# Patient Record
Sex: Male | Born: 1935
Health system: Southern US, Community
[De-identification: ages and names within clinical notes are randomized; demographics above are authoritative.]

## PROBLEM LIST (undated history)

## (undated) DIAGNOSIS — I10 Essential (primary) hypertension: Secondary | ICD-10-CM

## (undated) DIAGNOSIS — I639 Cerebral infarction, unspecified: Secondary | ICD-10-CM

## (undated) DIAGNOSIS — J449 Chronic obstructive pulmonary disease, unspecified: Secondary | ICD-10-CM

## (undated) HISTORY — DX: Cerebral infarction, unspecified: I63.9

## (undated) HISTORY — DX: Chronic obstructive pulmonary disease, unspecified: J44.9

## (undated) HISTORY — DX: Essential (primary) hypertension: I10

---

## 2012-12-05 ENCOUNTER — Ambulatory Visit: Payer: Self-pay | Admitting: Internal Medicine

## 2020-02-19 ENCOUNTER — Other Ambulatory Visit: Payer: Self-pay

## 2020-02-19 ENCOUNTER — Encounter: Payer: Self-pay | Admitting: Family Medicine

## 2020-02-19 ENCOUNTER — Ambulatory Visit (INDEPENDENT_AMBULATORY_CARE_PROVIDER_SITE_OTHER): Payer: Medicare Other | Admitting: Family Medicine

## 2020-02-19 VITALS — BP 132/75 | HR 88 | Temp 98.2°F | Ht 67.0 in | Wt 140.0 lb

## 2020-02-19 DIAGNOSIS — Z8673 Personal history of transient ischemic attack (TIA), and cerebral infarction without residual deficits: Secondary | ICD-10-CM

## 2020-02-19 DIAGNOSIS — I10 Essential (primary) hypertension: Secondary | ICD-10-CM | POA: Diagnosis not present

## 2020-02-19 DIAGNOSIS — J449 Chronic obstructive pulmonary disease, unspecified: Secondary | ICD-10-CM

## 2020-02-19 DIAGNOSIS — R634 Abnormal weight loss: Secondary | ICD-10-CM

## 2020-02-19 DIAGNOSIS — R5383 Other fatigue: Secondary | ICD-10-CM | POA: Diagnosis not present

## 2020-02-19 DIAGNOSIS — Z7689 Persons encountering health services in other specified circumstances: Secondary | ICD-10-CM | POA: Diagnosis not present

## 2020-02-19 MED ORDER — MEGESTROL ACETATE 20 MG PO TABS: 20.0000 mg | ORAL_TABLET | Freq: Two times a day (BID) | ORAL | 0 refills | Status: DC | PRN

## 2020-02-19 NOTE — Progress Notes (Signed)
BP 132/75   Pulse 88   Temp 98.2 F (36.8 C) (Oral)   Ht 5\' 7"  (1.702 m)   Wt 140 lb (63.5 kg)   SpO2 97%   BMI 21.93 kg/m    Subjective:    Patient ID: Ralph Leon, male    DOB: 03/30/1936, 84 y.o.   MRN: [id number]  HPI: Ralph Leon is a 84 y.o. male  Chief Complaint  Patient presents with  . Establish Care    pt wants to discuss about fatigue and shortness of breath    Pt presenting today to establish care.   Was told at [facility] that he'd had some small strokes when he presented for some dizziness in the past - states this happened at least 5 years ago but doesn't remember for sure. Has not been followed by Neurology since these episodes.   Currently taking lisinopril for HTN. Does not check home BPs. Tolerating well, denies CP, dizziness, HAs, syncope.   Last labs were about 5 years ago per patient.   Not having a good appetite and getting constipated the past few months. Last colonoscopy was many years ago. No blood in stools per patient.   Feeling tired the last 6 months or so, worse than his usual level of tiredness with no routine changes. Denies CP but does have SOB. Hx of COPD - never been on an inhaler in the past. No wheezing, frequent illnesses or coughing spells.   Relevant past medical, surgical, family and social history reviewed and updated as indicated. Interim medical history since our last visit reviewed. Allergies and medications reviewed and updated.  Review of Systems  Per HPI unless specifically indicated above     Objective:    BP 132/75   Pulse 88   Temp 98.2 F (36.8 C) (Oral)   Ht 5\' 7"  (1.702 m)   Wt 140 lb (63.5 kg)   SpO2 97%   BMI 21.93 kg/m   Wt Readings from Last 3 Encounters:  02/19/20 140 lb (63.5 kg)    Physical Exam Vitals and nursing note reviewed.  Constitutional:      Appearance: Normal appearance.  HENT:     Head: Atraumatic.  Eyes:     Extraocular Movements: Extraocular movements intact.   Conjunctiva/sclera: Conjunctivae normal.  Cardiovascular:     Rate and Rhythm: Normal rate and regular rhythm.  Pulmonary:     Effort: Pulmonary effort is normal.     Breath sounds: Wheezing (minimal wheezes diffusely) present.  Musculoskeletal:        General: Normal range of motion.     Cervical back: Normal range of motion and neck supple.  Skin:    General: Skin is warm and dry.  Neurological:     General: No focal deficit present.     Mental Status: He is oriented to person, place, and time.  Psychiatric:        Mood and Affect: Mood normal.        Thought Content: Thought content normal.        Judgment: Judgment normal.     Results for orders placed or performed in visit on 02/19/20  CBC with Differential/Platelet  Result Value Ref Range   WBC 7.7 3.4 - 10.8 x10E3/uL   RBC 4.79 4.14 - 5.80 x10E6/uL   Hemoglobin 14.5 13.0 - 17.7 g/dL   Hematocrit 43.8 37.5 - 51.0 %   MCV 91 79 - 97 fL   MCH 30.3 26.6 - 33.0 pg  MCHC 33.1 31.5 - 35.7 g/dL   RDW 25.0 53.9 - 76.7 %   Platelets 212 150 - 450 x10E3/uL   Neutrophils 64 Not Estab. %   Lymphs 22 Not Estab. %   Monocytes 8 Not Estab. %   Eos 5 Not Estab. %   Basos 1 Not Estab. %   Neutrophils Absolute 4.9 1.4 - 7.0 x10E3/uL   Lymphocytes Absolute 1.7 0.7 - 3.1 x10E3/uL   Monocytes Absolute 0.6 0.1 - 0.9 x10E3/uL   EOS (ABSOLUTE) 0.4 0.0 - 0.4 x10E3/uL   Basophils Absolute 0.0 0.0 - 0.2 x10E3/uL   Immature Granulocytes 0 Not Estab. %   Immature Grans (Abs) 0.0 0.0 - 0.1 x10E3/uL  Comprehensive metabolic panel  Result Value Ref Range   Glucose 110 (H) 65 - 99 mg/dL   BUN 18 8 - 27 mg/dL   Creatinine, Ser 3.41 0.76 - 1.27 mg/dL   GFR calc non Af Amer 62 >59 mL/min/1.73   GFR calc Af Amer 72 >59 mL/min/1.73   BUN/Creatinine Ratio 16 10 - 24   Sodium 142 134 - 144 mmol/L   Potassium 4.5 3.5 - 5.2 mmol/L   Chloride 102 96 - 106 mmol/L   CO2 26 20 - 29 mmol/L   Calcium 9.3 8.6 - 10.2 mg/dL   Total Protein 6.5 6.0 -  8.5 g/dL   Albumin 4.2 3.6 - 4.6 g/dL   Globulin, Total 2.3 1.5 - 4.5 g/dL   Albumin/Globulin Ratio 1.8 1.2 - 2.2   Bilirubin Total 0.3 0.0 - 1.2 mg/dL   Alkaline Phosphatase 85 39 - 117 IU/L   AST 15 0 - 40 IU/L   ALT 8 0 - 44 IU/L  Lipid Panel w/o Chol/HDL Ratio  Result Value Ref Range   Cholesterol, Total 231 (H) 100 - 199 mg/dL   Triglycerides 937 0 - 149 mg/dL   HDL 65 >90 mg/dL   VLDL Cholesterol Cal 24 5 - 40 mg/dL   LDL Chol Calc (NIH) 240 (H) 0 - 99 mg/dL  UA/M w/rflx Culture, Routine   Specimen: Urine   URINE  Result Value Ref Range   Specific Gravity, UA 1.025 1.005 - 1.030   pH, UA 6.0 5.0 - 7.5   Color, UA Yellow Yellow   Appearance Ur Clear Clear   Leukocytes,UA Negative Negative   Protein,UA Negative Negative/Trace   Glucose, UA Negative Negative   Ketones, UA Trace (A) Negative   RBC, UA Negative Negative   Bilirubin, UA Negative Negative   Urobilinogen, Ur 0.2 0.2 - 1.0 mg/dL   Nitrite, UA Negative Negative  TSH  Result Value Ref Range   TSH 1.680 0.450 - 4.500 uIU/mL      Assessment & Plan:   Problem List Items Addressed This Visit      Cardiovascular and Mediastinum   Essential hypertension    BPs stable and WNL, continue current regimen      Relevant Medications   lisinopril (ZESTRIL) 10 MG tablet   Other Relevant Orders   CBC with Differential/Platelet (Completed)   Comprehensive metabolic panel (Completed)   UA/M w/rflx Culture, Routine (Completed)     Respiratory   COPD (chronic obstructive pulmonary disease) (HCC)    Unclear if his SOB is due to COPD or cardiac/conditioning causes. Will obtain Spirometry, imaging at next visit and await basic labs today        Other   History of CVA (cerebrovascular accident)    Poor historian, will obtain records from previous providers  and obtain basic labs today.       Relevant Orders   Lipid Panel w/o Chol/HDL Ratio (Completed)    Other Visit Diagnoses    Weight loss    -  Primary   Poor  appetite, will trial megace and perform basic labs. Recheck in 2 weeks. May require further workup including GI, chest imaging, etc   Relevant Orders   TSH (Completed)   Encounter to establish care       Fatigue, unspecified type       Relevant Orders   CBC with Differential/Platelet (Completed)       Follow up plan: Return in about 2 weeks (around 03/04/2020) for SOB, Weight check.

## 2020-02-20 LAB — COMPREHENSIVE METABOLIC PANEL
ALT: 8 IU/L (ref 0–44)
AST: 15 IU/L (ref 0–40)
Albumin/Globulin Ratio: 1.8 (ref 1.2–2.2)
Albumin: 4.2 g/dL (ref 3.6–4.6)
Alkaline Phosphatase: 85 IU/L (ref 39–117)
BUN/Creatinine Ratio: 16 (ref 10–24)
BUN: 18 mg/dL (ref 8–27)
Bilirubin Total: 0.3 mg/dL (ref 0.0–1.2)
CO2: 26 mmol/L (ref 20–29)
Calcium: 9.3 mg/dL (ref 8.6–10.2)
Chloride: 102 mmol/L (ref 96–106)
Creatinine, Ser: 1.1 mg/dL (ref 0.76–1.27)
GFR calc Af Amer: 72 mL/min/{1.73_m2} (ref >59)
GFR calc non Af Amer: 62 mL/min/{1.73_m2} (ref >59)
Globulin, Total: 2.3 g/dL (ref 1.5–4.5)
Glucose: 110 mg/dL — ABNORMAL HIGH (ref 65–99)
Potassium: 4.5 mmol/L (ref 3.5–5.2)
Sodium: 142 mmol/L (ref 134–144)
Total Protein: 6.5 g/dL (ref 6.0–8.5)

## 2020-02-20 LAB — LIPID PANEL W/O CHOL/HDL RATIO
Cholesterol, Total: 231 mg/dL — ABNORMAL HIGH (ref 100–199)
HDL: 65 mg/dL (ref >39)
LDL Chol Calc (NIH): 142 mg/dL — ABNORMAL HIGH (ref 0–99)
Triglycerides: 138 mg/dL (ref 0–149)
VLDL Cholesterol Cal: 24 mg/dL (ref 5–40)

## 2020-02-20 LAB — UA/M W/RFLX CULTURE, ROUTINE
Bilirubin, UA: NEGATIVE
Glucose, UA: NEGATIVE
Leukocytes,UA: NEGATIVE
Nitrite, UA: NEGATIVE
Protein,UA: NEGATIVE
RBC, UA: NEGATIVE
Specific Gravity, UA: 1.025 (ref 1.005–1.030)
Urobilinogen, Ur: 0.2 mg/dL (ref 0.2–1.0)
pH, UA: 6 (ref 5.0–7.5)

## 2020-02-20 LAB — CBC WITH DIFFERENTIAL/PLATELET
Basophils Absolute: 0 10*3/uL (ref 0.0–0.2)
Basos: 1 %
EOS (ABSOLUTE): 0.4 10*3/uL (ref 0.0–0.4)
Eos: 5 %
Hematocrit: 43.8 % (ref 37.5–51.0)
Hemoglobin: 14.5 g/dL (ref 13.0–17.7)
Immature Grans (Abs): 0 10*3/uL (ref 0.0–0.1)
Immature Granulocytes: 0 %
Lymphocytes Absolute: 1.7 10*3/uL (ref 0.7–3.1)
Lymphs: 22 %
MCH: 30.3 pg (ref 26.6–33.0)
MCHC: 33.1 g/dL (ref 31.5–35.7)
MCV: 91 fL (ref 79–97)
Monocytes Absolute: 0.6 10*3/uL (ref 0.1–0.9)
Monocytes: 8 %
Neutrophils Absolute: 4.9 10*3/uL (ref 1.4–7.0)
Neutrophils: 64 %
Platelets: 212 10*3/uL (ref 150–450)
RBC: 4.79 x10E6/uL (ref 4.14–5.80)
RDW: 13.7 % (ref 11.6–15.4)
WBC: 7.7 10*3/uL (ref 3.4–10.8)

## 2020-02-20 LAB — TSH: TSH: 1.68 u[IU]/mL (ref 0.450–4.500)

## 2020-03-03 DIAGNOSIS — Z8673 Personal history of transient ischemic attack (TIA), and cerebral infarction without residual deficits: Secondary | ICD-10-CM | POA: Insufficient documentation

## 2020-03-03 DIAGNOSIS — I10 Essential (primary) hypertension: Secondary | ICD-10-CM | POA: Insufficient documentation

## 2020-03-03 DIAGNOSIS — J449 Chronic obstructive pulmonary disease, unspecified: Secondary | ICD-10-CM | POA: Insufficient documentation

## 2020-03-03 NOTE — Assessment & Plan Note (Signed)
Poor historian, will obtain records from previous providers and obtain basic labs today.

## 2020-03-03 NOTE — Assessment & Plan Note (Signed)
BPs stable and WNL, continue current regimen 

## 2020-03-03 NOTE — Assessment & Plan Note (Signed)
Unclear if his SOB is due to COPD or cardiac/conditioning causes. Will obtain Spirometry, imaging at next visit and await basic labs today

## 2020-03-04 ENCOUNTER — Ambulatory Visit (INDEPENDENT_AMBULATORY_CARE_PROVIDER_SITE_OTHER): Payer: Medicare Other | Admitting: Family Medicine

## 2020-03-04 ENCOUNTER — Other Ambulatory Visit: Payer: Self-pay

## 2020-03-04 ENCOUNTER — Encounter: Payer: Self-pay | Admitting: Family Medicine

## 2020-03-04 VITALS — BP 161/83 | HR 97 | Temp 98.2°F | Wt 136.0 lb

## 2020-03-04 DIAGNOSIS — R9431 Abnormal electrocardiogram [ECG] [EKG]: Secondary | ICD-10-CM

## 2020-03-04 DIAGNOSIS — J449 Chronic obstructive pulmonary disease, unspecified: Secondary | ICD-10-CM | POA: Diagnosis not present

## 2020-03-04 DIAGNOSIS — R0602 Shortness of breath: Secondary | ICD-10-CM | POA: Diagnosis not present

## 2020-03-04 DIAGNOSIS — R634 Abnormal weight loss: Secondary | ICD-10-CM | POA: Diagnosis not present

## 2020-03-04 DIAGNOSIS — I10 Essential (primary) hypertension: Secondary | ICD-10-CM

## 2020-03-04 MED ORDER — LISINOPRIL 20 MG PO TABS: 20.0000 mg | ORAL_TABLET | Freq: Every day | ORAL | 1 refills | Status: DC

## 2020-03-04 MED ORDER — MEGESTROL ACETATE 20 MG PO TABS: 20.0000 mg | ORAL_TABLET | Freq: Two times a day (BID) | ORAL | 1 refills | Status: DC | PRN

## 2020-03-04 MED ORDER — ANORO ELLIPTA 62.5-25 MCG/INH IN AEPB: 1.0000 | INHALATION_SPRAY | Freq: Every day | RESPIRATORY_TRACT | 0 refills | Status: DC

## 2020-03-04 NOTE — Progress Notes (Signed)
BP (!) 161/83   Pulse 97   Temp 98.2 F (36.8 C) (Oral)   Wt 136 lb (61.7 kg)   SpO2 96%   BMI 21.30 kg/m    Subjective:    Patient ID: Ralph Leon, male    DOB: May 01, 1936, 84 y.o.   MRN: [id number]  HPI: Ralph Leon is a 84 y.o. male  Chief Complaint  Patient presents with  . Shortness of Breath  . Weight Check   Presenting today for weight follow up after starting megace for appetite stimulation and his ongoing SOB issues.   COPD - started at age 25, smokes 1 ppd but lately cut back to half ppd. DOE with mowing the yard, doing a lot of walking. Denies CP, wheezing, syncope, diaphoresis, nausea associated. States he's never been on any inhaler regimen for his COPD. Has not had any imaging in years per patient.   Not feeling as constipated now that he's eating more. Denies N/V, abdominal pain. Feels the megace is helping and he's been eating substantially more, though his weight is down 4 lb from last visit.   Home BP readings running about 140/80 on lisinopril 10 mg per patient. Denies side effects.   Relevant past medical, surgical, family and social history reviewed and updated as indicated. Interim medical history since our last visit reviewed. Allergies and medications reviewed and updated.  Review of Systems  Per HPI unless specifically indicated above     Objective:    BP (!) 161/83   Pulse 97   Temp 98.2 F (36.8 C) (Oral)   Wt 136 lb (61.7 kg)   SpO2 96%   BMI 21.30 kg/m   Wt Readings from Last 3 Encounters:  03/04/20 136 lb (61.7 kg)  02/19/20 140 lb (63.5 kg)    Physical Exam Vitals and nursing note reviewed.  Constitutional:      General: He is not in acute distress.    Comments: underweight  HENT:     Head: Atraumatic.  Eyes:     Extraocular Movements: Extraocular movements intact.     Conjunctiva/sclera: Conjunctivae normal.  Cardiovascular:     Rate and Rhythm: Normal rate and regular rhythm.  Pulmonary:     Effort: Pulmonary  effort is normal.     Comments: Breath sounds decreased diffusely Abdominal:     General: Bowel sounds are normal. There is no distension.     Palpations: Abdomen is soft.     Tenderness: There is no abdominal tenderness.  Musculoskeletal:        General: Normal range of motion.     Cervical back: Normal range of motion and neck supple.  Skin:    General: Skin is warm and dry.  Neurological:     General: No focal deficit present.     Mental Status: He is alert and oriented to person, place, and time.  Psychiatric:        Mood and Affect: Mood normal.        Thought Content: Thought content normal.        Judgment: Judgment normal.     Results for orders placed or performed in visit on 02/19/20  CBC with Differential/Platelet  Result Value Ref Range   WBC 7.7 3.4 - 10.8 x10E3/uL   RBC 4.79 4.14 - 5.80 x10E6/uL   Hemoglobin 14.5 13.0 - 17.7 g/dL   Hematocrit 25.0 53.9 - 51.0 %   MCV 91 79 - 97 fL   MCH 30.3 26.6 - 33.0 pg  MCHC 33.1 31.5 - 35.7 g/dL   RDW 52.8 41.3 - 24.4 %   Platelets 212 150 - 450 x10E3/uL   Neutrophils 64 Not Estab. %   Lymphs 22 Not Estab. %   Monocytes 8 Not Estab. %   Eos 5 Not Estab. %   Basos 1 Not Estab. %   Neutrophils Absolute 4.9 1.4 - 7.0 x10E3/uL   Lymphocytes Absolute 1.7 0.7 - 3.1 x10E3/uL   Monocytes Absolute 0.6 0.1 - 0.9 x10E3/uL   EOS (ABSOLUTE) 0.4 0.0 - 0.4 x10E3/uL   Basophils Absolute 0.0 0.0 - 0.2 x10E3/uL   Immature Granulocytes 0 Not Estab. %   Immature Grans (Abs) 0.0 0.0 - 0.1 x10E3/uL  Comprehensive metabolic panel  Result Value Ref Range   Glucose 110 (H) 65 - 99 mg/dL   BUN 18 8 - 27 mg/dL   Creatinine, Ser 0.10 0.76 - 1.27 mg/dL   GFR calc non Af Amer 62 >59 mL/min/1.73   GFR calc Af Amer 72 >59 mL/min/1.73   BUN/Creatinine Ratio 16 10 - 24   Sodium 142 134 - 144 mmol/L   Potassium 4.5 3.5 - 5.2 mmol/L   Chloride 102 96 - 106 mmol/L   CO2 26 20 - 29 mmol/L   Calcium 9.3 8.6 - 10.2 mg/dL   Total Protein 6.5 6.0  - 8.5 g/dL   Albumin 4.2 3.6 - 4.6 g/dL   Globulin, Total 2.3 1.5 - 4.5 g/dL   Albumin/Globulin Ratio 1.8 1.2 - 2.2   Bilirubin Total 0.3 0.0 - 1.2 mg/dL   Alkaline Phosphatase 85 39 - 117 IU/L   AST 15 0 - 40 IU/L   ALT 8 0 - 44 IU/L  Lipid Panel w/o Chol/HDL Ratio  Result Value Ref Range   Cholesterol, Total 231 (H) 100 - 199 mg/dL   Triglycerides 272 0 - 149 mg/dL   HDL 65 >53 mg/dL   VLDL Cholesterol Cal 24 5 - 40 mg/dL   LDL Chol Calc (NIH) 664 (H) 0 - 99 mg/dL  UA/M w/rflx Culture, Routine   Specimen: Urine   URINE  Result Value Ref Range   Specific Gravity, UA 1.025 1.005 - 1.030   pH, UA 6.0 5.0 - 7.5   Color, UA Yellow Yellow   Appearance Ur Clear Clear   Leukocytes,UA Negative Negative   Protein,UA Negative Negative/Trace   Glucose, UA Negative Negative   Ketones, UA Trace (A) Negative   RBC, UA Negative Negative   Bilirubin, UA Negative Negative   Urobilinogen, Ur 0.2 0.2 - 1.0 mg/dL   Nitrite, UA Negative Negative  TSH  Result Value Ref Range   TSH 1.680 0.450 - 4.500 uIU/mL      Assessment & Plan:   Problem List Items Addressed This Visit      Cardiovascular and Mediastinum   Essential hypertension    BPs mildly above goal consistently, increase to 20 mg lisinopril and recheck at upcoming f/u. Continue home monitoring      Relevant Medications   lisinopril (ZESTRIL) 20 MG tablet     Respiratory   COPD (chronic obstructive pulmonary disease) (HCC)    Will start anoro and obtain CT Chest due to his progressive and worrisome sxs. Continue working on smoking cessation      Relevant Medications   umeclidinium-vilanterol (ANORO ELLIPTA) 62.5-25 MCG/INH AEPB    Other Visit Diagnoses    SOB (shortness of breath)    -  Primary   Concern for malignancy given worsening DOE, unexplained  weight loss, fatigue and 66 pack year smoking hx. Obtain CT chest, start anoro, ekg   Relevant Orders   EKG 12-Lead (Completed)   Shortness of breath       Relevant  Orders   CT Chest W Contrast   Ambulatory referral to Cardiology   Unexplained weight loss       Continue megace, push PO, monitor closely   Relevant Orders   CT Chest W Contrast   Abnormal EKG       EKG showing possible old infarct/ischemic changes. Will refer to Cardiology for further eval given his worsening DOE though possibly more related to his COPD   Relevant Orders   Ambulatory referral to Cardiology       Follow up plan: Return in about 4 weeks (around 04/01/2020) for SOB, BP f/u.

## 2020-03-05 NOTE — Assessment & Plan Note (Signed)
Will start anoro and obtain CT Chest due to his progressive and worrisome sxs. Continue working on smoking cessation

## 2020-03-05 NOTE — Assessment & Plan Note (Signed)
BPs mildly above goal consistently, increase to 20 mg lisinopril and recheck at upcoming f/u. Continue home monitoring

## 2020-03-08 ENCOUNTER — Ambulatory Visit
Admission: RE | Admit: 2020-03-08 | Discharge: 2020-03-08 | Disposition: A | Discharge status: Home / Self Care | Payer: Medicare Other | Source: Ambulatory Visit | Attending: Family Medicine | Admitting: Family Medicine

## 2020-03-08 ENCOUNTER — Other Ambulatory Visit: Payer: Self-pay

## 2020-03-08 DIAGNOSIS — R634 Abnormal weight loss: Secondary | ICD-10-CM | POA: Diagnosis present

## 2020-03-08 DIAGNOSIS — R0602 Shortness of breath: Secondary | ICD-10-CM

## 2020-03-08 MED ORDER — IOHEXOL 300 MG/ML  SOLN
75.0000 mL | Freq: Once | INTRAMUSCULAR | Status: AC | PRN
  Administered 2020-03-08: 11:00:00 75 mL via INTRAVENOUS

## 2020-03-11 ENCOUNTER — Other Ambulatory Visit: Payer: Self-pay

## 2020-03-11 ENCOUNTER — Encounter: Payer: Self-pay | Admitting: Cardiology

## 2020-03-11 ENCOUNTER — Ambulatory Visit (INDEPENDENT_AMBULATORY_CARE_PROVIDER_SITE_OTHER): Payer: Medicare Other | Admitting: Cardiology

## 2020-03-11 VITALS — BP 150/82 | HR 87 | Temp 98.0°F | Ht 67.0 in | Wt 138.4 lb

## 2020-03-11 DIAGNOSIS — R0600 Dyspnea, unspecified: Secondary | ICD-10-CM

## 2020-03-11 DIAGNOSIS — F17200 Nicotine dependence, unspecified, uncomplicated: Secondary | ICD-10-CM | POA: Diagnosis not present

## 2020-03-11 DIAGNOSIS — R0609 Other forms of dyspnea: Secondary | ICD-10-CM

## 2020-03-11 NOTE — Progress Notes (Signed)
Cardiology Office Note:    Date:  03/11/2020   ID:  Ralph Leon, DOB 08-05-1936, MRN [id number]  PCP:  Particia Nearing, PA-C  Cardiologist:  No primary care provider on file.  Electrophysiologist:  None   Referring MD: Particia Nearing,*   Chief Complaint  Patient presents with  . New Patient (Initial Visit)    Ref by Roosvelt Maser, PA-C for abnormal EKG and shortness of breath.  Pt. c/o shortness of breath with walking a far distance.     History of Present Illness:    Ralph Leon is a 84 y.o. male with a hx of hypertension, stroke, COPD, current smoker x50+ years who presents due to shortness of breath and abnormal EKG. EKG obtained on 03/04/2020 by primary care provider showed old septal infarct.  He denies chest pain.  He states having worsening shortness of breath over the past 3 months.  Walking or trying to mow his lawn causes him to have shortness of breath, resting improves his symptoms.  He denies palpitations or any history of heart disease.  He otherwise feels okay.  Denies any history of heart attacks.  He is a current smoker.  Past Medical History:  Diagnosis Date  . COPD (chronic obstructive pulmonary disease) (HCC)   . Hypertension   . Stroke [facility])     History reviewed. No pertinent surgical history.  Current Medications: Current Meds  Medication Sig  . lisinopril (ZESTRIL) 20 MG tablet Take 1 tablet (20 mg total) by mouth daily.  . megestrol (MEGACE) 20 MG tablet Take 1 tablet (20 mg total) by mouth 2 (two) times daily as needed.  . umeclidinium-vilanterol (ANORO ELLIPTA) 62.5-25 MCG/INH AEPB Inhale 1 puff into the lungs daily.     Allergies:   Patient has no known allergies.   Social History   Socioeconomic History  . Marital status: Single    Spouse name: Not on file  . Number of children: Not on file  . Years of education: Not on file  . Highest education level: Not on file  Occupational History  . Not on file  Tobacco Use  .  Smoking status: Current Every Day Smoker    Packs/day: 0.50    Types: Cigarettes  . Smokeless tobacco: Never Used  Substance and Sexual Activity  . Alcohol use: Yes    Alcohol/week: 5.0 standard drinks    Types: 5 Cans of beer per week  . Drug use: Not Currently  . Sexual activity: Not Currently  Other Topics Concern  . Not on file  Social History Narrative  . Not on file   Social Determinants of Health   Financial Resource Strain:   . Difficulty of Paying Living Expenses:   Food Insecurity:   . Worried About Programme researcher, broadcasting/film/video in the Last Year:   . Barista in the Last Year:   Transportation Needs:   . Freight forwarder (Medical):   Marland Kitchen Lack of Transportation (Non-Medical):   Physical Activity:   . Days of Exercise per Week:   . Minutes of Exercise per Session:   Stress:   . Feeling of Stress :   Social Connections:   . Frequency of Communication with Friends and Family:   . Frequency of Social Gatherings with Friends and Family:   . Attends Religious Services:   . Active Member of Clubs or Organizations:   . Attends Banker Meetings:   Marland Kitchen Marital Status:      Family  History: The patient's family history includes Diabetes in his mother; Heart attack in his father.  ROS:   Please see the history of present illness.     All other systems reviewed and are negative.  EKGs/Labs/Other Studies Reviewed:    The following studies were reviewed today:   EKG:  EKG is  ordered today.  The ekg ordered today demonstrates sinus rhythm, possible old septal infarct.  Recent Labs: 02/19/2020: ALT 8; BUN 18; Creatinine, Ser 1.10; Hemoglobin 14.5; Platelets 212; Potassium 4.5; Sodium 142; TSH 1.680  Recent Lipid Panel    Component Value Date/Time   CHOL 231 (H) 02/19/2020 1608   TRIG 138 02/19/2020 1608   HDL 65 02/19/2020 1608   LDLCALC 142 (H) 02/19/2020 1608    Physical Exam:    VS:  BP (!) 150/82 (BP Location: Right Arm, Patient Position:  Sitting, Cuff Size: Normal)   Pulse 87   Temp 98 F (36.7 C)   Ht 5\' 7"  (1.702 m)   Wt 138 lb 6 oz (62.8 kg)   BMI 21.67 kg/m     Wt Readings from Last 3 Encounters:  03/11/20 138 lb 6 oz (62.8 kg)  03/04/20 136 lb (61.7 kg)  02/19/20 140 lb (63.5 kg)     GEN:  Well nourished, well developed in no acute distress HEENT: Normal NECK: No JVD; No carotid bruits LYMPHATICS: No lymphadenopathy CARDIAC: RRR, no murmurs, rubs, gallops RESPIRATORY: Decreased breath sounds but otherwise clear ABDOMEN: Soft, non-tender, non-distended MUSCULOSKELETAL:  No edema; No deformity  SKIN: Warm and dry NEUROLOGIC:  Alert and oriented x 3 PSYCHIATRIC:  Normal affect   ASSESSMENT:    1. DOE (dyspnea on exertion)   2. Smoking    PLAN:    In order of problems listed above:  1. Patient with a 38-month history of worsening dyspnea on exertion.  He has risk factors of hypertension, current smoker.  This could be angina.  Also COPD could be contributing.  Will evaluate for presence of ischemia with Myoview.  Get echocardiogram to evaluate any structural abnormalities. 2. Patient is a current smoker.  Smoking cessation advised.  Follow-up after echo and stress test.  This note was generated in part or whole with voice recognition software. Voice recognition is usually quite accurate but there are transcription errors that can and very often do occur. I apologize for any typographical errors that were not detected and corrected.  Medication Adjustments/Labs and Tests Ordered: Current medicines are reviewed at length with the patient today.  Concerns regarding medicines are outlined above.  Orders Placed This Encounter  Procedures  . NM Myocar Multi W/Spect W/Wall Motion / EF  . EKG 12-Lead  . ECHOCARDIOGRAM COMPLETE   No orders of the defined types were placed in this encounter.   Patient Instructions  Medication Instructions:  Your physician recommends that you continue on your current  medications as directed. Please refer to the Current Medication list given to you today.  *If you need a refill on your cardiac medications before your next appointment, please call your pharmacy*   Lab Work: none If you have labs (blood work) drawn today and your tests are completely normal, you will receive your results only by: 2-month MyChart Message (if you have MyChart) OR . A paper copy in the mail If you have any lab test that is abnormal or we need to change your treatment, we will call you to review the results.   Testing/Procedures: 1- ECHOCARDIOGRAM- Your physician has requested  that you have an echocardiogram. Echocardiography is a painless test that uses sound waves to create images of your heart. It provides your doctor with information about the size and shape of your heart and how well your heart's chambers and valves are working. This procedure takes approximately one hour. There are no restrictions for this procedure. You may get an IV, if needed, to receive an ultrasound enhancing agent through to better visualize your heart.    2- LEXISCAN MYOVIEW Your physician has requested that you have a lexiscan myoview. For further information please visit [email or web address] Please follow instruction sheet, as given.  [facility] MYOVIEW  Your caregiver has ordered a Stress Test with nuclear imaging. The purpose of this test is to evaluate the blood supply to your heart muscle. This procedure is referred to as a "Non-Invasive Stress Test." This is because other than having an IV started in your vein, nothing is inserted or "invades" your body. Cardiac stress tests are done to find areas of poor blood flow to the heart by determining the extent of coronary artery disease (CAD). Some patients exercise on a treadmill, which naturally increases the blood flow to your heart, while others who are  unable to walk on a treadmill due to physical limitations have a pharmacologic/chemical stress agent  called Lexiscan . This medicine will mimic walking on a treadmill by temporarily increasing your coronary blood flow.   Please note: these test may take anywhere between 2-4 hours to complete  PLEASE REPORT TO [facility] MEDICAL MALL ENTRANCE  THE VOLUNTEERS AT THE FIRST DESK WILL DIRECT YOU WHERE TO GO  Date of Procedure:_____________________________________  Arrival Time for Procedure:______________________________   PLEASE NOTIFY THE OFFICE AT LEAST 24 HOURS IN ADVANCE IF YOU ARE UNABLE TO KEEP YOUR APPOINTMENT.  [phone] AND  PLEASE NOTIFY NUCLEAR MEDICINE AT [facility] AT LEAST 24 HOURS IN ADVANCE IF YOU ARE UNABLE TO KEEP YOUR APPOINTMENT. [phone]  How to prepare for your Myoview test:  1. Do not eat or drink after midnight 2. No caffeine for 24 hours prior to test 3. No smoking 24 hours prior to test. 4. Your medication may be taken with water.  If your doctor stopped a medication because of this test, do not take that medication. 5. Ladies, please do not wear dresses.  Skirts or pants are appropriate. Please wear a short sleeve shirt. 6. No perfume, cologne or lotion. 7. Wear comfortable walking shoes.   Follow-Up: At [facility], you and your health needs are our priority.  As part of our continuing mission to provide you with exceptional heart care, we have created designated Provider Care Teams.  These Care Teams include your primary Cardiologist (physician) and Advanced Practice Providers (APPs -  Physician Assistants and Nurse Practitioners) who all work together to provide you with the care you need, when you need it.  We recommend signing up for the patient portal called "MyChart".  Sign up information is provided on this After Visit Summary.  MyChart is used to connect with patients for Virtual Visits (Telemedicine).  Patients are able to view lab/test results, encounter notes, upcoming appointments, etc.  Non-urgent messages can be sent to your provider as well.   To  learn more about what you can do with MyChart, go to [email or web address].    Your next appointment:   After testing.  The format for your next appointment:   In Person  Provider:   Debbe Odea, MD    Echocardiogram An echocardiogram is a procedure  that uses painless sound waves (ultrasound) to produce an image of the heart. Images from an echocardiogram can provide important information about:  Signs of coronary artery disease (CAD).  Aneurysm detection. An aneurysm is a weak or damaged part of an artery wall that bulges out from the normal force of blood pumping through the body.  Heart size and shape. Changes in the size or shape of the heart can be associated with certain conditions, including heart failure, aneurysm, and CAD.  Heart muscle function.  Heart valve function.  Signs of a past heart attack.  Fluid buildup around the heart.  Thickening of the heart muscle.  A tumor or infectious growth around the heart valves. Tell a health care provider about:  Any allergies you have.  All medicines you are taking, including vitamins, herbs, eye drops, creams, and over-the-counter medicines.  Any blood disorders you have.  Any surgeries you have had.  Any medical conditions you have.  Whether you are pregnant or may be pregnant. What are the risks? Generally, this is a safe procedure. However, problems may occur, including:  Allergic reaction to dye (contrast) that may be used during the procedure. What happens before the procedure? No specific preparation is needed. You may eat and drink normally. What happens during the procedure?   An IV tube may be inserted into one of your veins.  You may receive contrast through this tube. A contrast is an injection that improves the quality of the pictures from your heart.  A gel will be applied to your chest.  A wand-like tool (transducer) will be moved over your chest. The gel will help to transmit the  sound waves from the transducer.  The sound waves will harmlessly bounce off of your heart to allow the heart images to be captured in real-time motion. The images will be recorded on a computer. The procedure may vary among health care providers and hospitals. What happens after the procedure?  You may return to your normal, everyday life, including diet, activities, and medicines, unless your health care provider tells you not to do that. Summary  An echocardiogram is a procedure that uses painless sound waves (ultrasound) to produce an image of the heart.  Images from an echocardiogram can provide important information about the size and shape of your heart, heart muscle function, heart valve function, and fluid buildup around your heart.  You do not need to do anything to prepare before this procedure. You may eat and drink normally.  After the echocardiogram is completed, you may return to your normal, everyday life, unless your health care provider tells you not to do that. This information is not intended to replace advice given to you by your health care provider. Make sure you discuss any questions you have with your health care provider. Document Revised: 03/02/2019 Document Reviewed: 12/12/2016 Elsevier Patient Education  2020 [place].    Cardiac Nuclear Scan A cardiac nuclear scan is a test that measures blood flow to the heart when a person is resting and when he or she is exercising. The test looks for problems such as:  Not enough blood reaching a portion of the heart.  The heart muscle not working normally. You may need this test if:  You have heart disease.  You have had abnormal lab results.  You have had heart surgery or a balloon procedure to open up blocked arteries (angioplasty).  You have chest pain.  You have shortness of breath. In this test,  a radioactive dye (tracer) is injected into your bloodstream. After the tracer has traveled to your heart,  an imaging device is used to measure how much of the tracer is absorbed by or distributed to various areas of your heart. This procedure is usually done at a hospital and takes 2-4 hours. Tell a health care provider about:  Any allergies you have.  All medicines you are taking, including vitamins, herbs, eye drops, creams, and over-the-counter medicines.  Any problems you or family members have had with anesthetic medicines.  Any blood disorders you have.  Any surgeries you have had.  Any medical conditions you have.  Whether you are pregnant or may be pregnant. What are the risks? Generally, this is a safe procedure. However, problems may occur, including:  Serious chest pain and heart attack. This is only a risk if the stress portion of the test is done.  Rapid heartbeat.  Sensation of warmth in your chest. This usually passes quickly.  Allergic reaction to the tracer. What happens before the procedure?  Ask your health care provider about changing or stopping your regular medicines. This is especially important if you are taking diabetes medicines or blood thinners.  Follow instructions from your health care provider about eating or drinking restrictions.  Remove your jewelry on the day of the procedure. What happens during the procedure?  An IV will be inserted into one of your veins.  Your health care provider will inject a small amount of radioactive tracer through the IV.  You will wait for 20-40 minutes while the tracer travels through your bloodstream.  Your heart activity will be monitored with an electrocardiogram (ECG).  You will lie down on an exam table.  Images of your heart will be taken for about 15-20 minutes.  You may also have a stress test. For this test, one of the following may be done: ? You will exercise on a treadmill or stationary bike. While you exercise, your heart's activity will be monitored with an ECG, and your blood pressure will be  checked. ? You will be given medicines that will increase blood flow to parts of your heart. This is done if you are unable to exercise.  When blood flow to your heart has peaked, a tracer will again be injected through the IV.  After 20-40 minutes, you will get back on the exam table and have more images taken of your heart.  Depending on the type of tracer used, scans may need to be repeated 3-4 hours later.  Your IV line will be removed when the procedure is over. The procedure may vary among health care providers and hospitals. What happens after the procedure?  Unless your health care provider tells you otherwise, you may return to your normal schedule, including diet, activities, and medicines.  Unless your health care provider tells you otherwise, you may increase your fluid intake. This will help to flush the contrast dye from your body. Drink enough fluid to keep your urine pale yellow.  Ask your health care provider, or the department that is doing the test: ? When will my results be ready? ? How will I get my results? Summary  A cardiac nuclear scan measures the blood flow to the heart when a person is resting and when he or she is exercising.  Tell your health care provider if you are pregnant.  Before the procedure, ask your health care provider about changing or stopping your regular medicines. This is especially important  if you are taking diabetes medicines or blood thinners.  After the procedure, unless your health care provider tells you otherwise, increase your fluid intake. This will help flush the contrast dye from your body.  After the procedure, unless your health care provider tells you otherwise, you may return to your normal schedule, including diet, activities, and medicines. This information is not intended to replace advice given to you by your health care provider. Make sure you discuss any questions you have with your health care provider. Document  Revised: 04/25/2018 Document Reviewed: 04/25/2018 Elsevier Patient Education  2020 [employer]     Signed, Debbe OdeaBrian Agbor-Etang, MD  03/11/2020 5:09 PM    [facility]

## 2020-03-11 NOTE — Patient Instructions (Addendum)
Medication Instructions:  Your physician recommends that you continue on your current medications as directed. Please refer to the Current Medication list given to you today.  *If you need a refill on your cardiac medications before your next appointment, please call your pharmacy*   Lab Work: none If you have labs (blood work) drawn today and your tests are completely normal, you will receive your results only by: Marland Kitchen MyChart Message (if you have MyChart) OR . A paper copy in the mail If you have any lab test that is abnormal or we need to change your treatment, we will call you to review the results.   Testing/Procedures: 1- ECHOCARDIOGRAM- Your physician has requested that you have an echocardiogram. Echocardiography is a painless test that uses sound waves to create images of your heart. It provides your doctor with information about the size and shape of your heart and how well your heart's chambers and valves are working. This procedure takes approximately one hour. There are no restrictions for this procedure. You may get an IV, if needed, to receive an ultrasound enhancing agent through to better visualize your heart.    2- LEXISCAN MYOVIEW Your physician has requested that you have a lexiscan myoview. For further information please visit [email or web address] Please follow instruction sheet, as given.  [facility] MYOVIEW  Your caregiver has ordered a Stress Test with nuclear imaging. The purpose of this test is to evaluate the blood supply to your heart muscle. This procedure is referred to as a "Non-Invasive Stress Test." This is because other than having an IV started in your vein, nothing is inserted or "invades" your body. Cardiac stress tests are done to find areas of poor blood flow to the heart by determining the extent of coronary artery disease (CAD). Some patients exercise on a treadmill, which naturally increases the blood flow to your heart, while others who are  unable to walk on  a treadmill due to physical limitations have a pharmacologic/chemical stress agent called Lexiscan . This medicine will mimic walking on a treadmill by temporarily increasing your coronary blood flow.   Please note: these test may take anywhere between 2-4 hours to complete  PLEASE REPORT TO [facility]  THE VOLUNTEERS AT THE FIRST DESK WILL DIRECT YOU WHERE TO GO  Date of Procedure:_____________________________________  Arrival Time for Procedure:______________________________   PLEASE NOTIFY THE OFFICE AT LEAST 24 HOURS IN ADVANCE IF YOU ARE UNABLE TO KEEP YOUR APPOINTMENT.  [phone] AND  PLEASE NOTIFY NUCLEAR MEDICINE AT [facility] AT LEAST 24 HOURS IN ADVANCE IF YOU ARE UNABLE TO KEEP YOUR APPOINTMENT. [phone]  How to prepare for your Myoview test:  1. Do not eat or drink after midnight 2. No caffeine for 24 hours prior to test 3. No smoking 24 hours prior to test. 4. Your medication may be taken with water.  If your doctor stopped a medication because of this test, do not take that medication. 5. Ladies, please do not wear dresses.  Skirts or pants are appropriate. Please wear a short sleeve shirt. 6. No perfume, cologne or lotion. 7. Wear comfortable walking shoes.   Follow-Up: At [facility], you and your health needs are our priority.  As part of our continuing mission to provide you with exceptional heart care, we have created designated Provider Care Teams.  These Care Teams include your primary Cardiologist (physician) and Advanced Practice Providers (APPs -  Physician Assistants and Nurse Practitioners) who all work together to provide you with the  care you need, when you need it.  We recommend signing up for the patient portal called "MyChart".  Sign up information is provided on this After Visit Summary.  MyChart is used to connect with patients for Virtual Visits (Telemedicine).  Patients are able to view lab/test results, encounter notes, upcoming  appointments, etc.  Non-urgent messages can be sent to your provider as well.   To learn more about what you can do with MyChart, go to [email or web address].    Your next appointment:   After testing.  The format for your next appointment:   In Person  Provider:   Kate Sable, MD    Echocardiogram An echocardiogram is a procedure that uses painless sound waves (ultrasound) to produce an image of the heart. Images from an echocardiogram can provide important information about:  Signs of coronary artery disease (CAD).  Aneurysm detection. An aneurysm is a weak or damaged part of an artery wall that bulges out from the normal force of blood pumping through the body.  Heart size and shape. Changes in the size or shape of the heart can be associated with certain conditions, including heart failure, aneurysm, and CAD.  Heart muscle function.  Heart valve function.  Signs of a past heart attack.  Fluid buildup around the heart.  Thickening of the heart muscle.  A tumor or infectious growth around the heart valves. Tell a health care provider about:  Any allergies you have.  All medicines you are taking, including vitamins, herbs, eye drops, creams, and over-the-counter medicines.  Any blood disorders you have.  Any surgeries you have had.  Any medical conditions you have.  Whether you are pregnant or may be pregnant. What are the risks? Generally, this is a safe procedure. However, problems may occur, including:  Allergic reaction to dye (contrast) that may be used during the procedure. What happens before the procedure? No specific preparation is needed. You may eat and drink normally. What happens during the procedure?   An IV tube may be inserted into one of your veins.  You may receive contrast through this tube. A contrast is an injection that improves the quality of the pictures from your heart.  A gel will be applied to your chest.  A wand-like  tool (transducer) will be moved over your chest. The gel will help to transmit the sound waves from the transducer.  The sound waves will harmlessly bounce off of your heart to allow the heart images to be captured in real-time motion. The images will be recorded on a computer. The procedure may vary among health care providers and hospitals. What happens after the procedure?  You may return to your normal, everyday life, including diet, activities, and medicines, unless your health care provider tells you not to do that. Summary  An echocardiogram is a procedure that uses painless sound waves (ultrasound) to produce an image of the heart.  Images from an echocardiogram can provide important information about the size and shape of your heart, heart muscle function, heart valve function, and fluid buildup around your heart.  You do not need to do anything to prepare before this procedure. You may eat and drink normally.  After the echocardiogram is completed, you may return to your normal, everyday life, unless your health care provider tells you not to do that. This information is not intended to replace advice given to you by your health care provider. Make sure you discuss any questions you have with your health care  provider. Document Revised: 03/02/2019 Document Reviewed: 12/12/2016 Elsevier Patient Education  2020 [employer]    Cardiac Nuclear Scan A cardiac nuclear scan is a test that measures blood flow to the heart when a person is resting and when he or she is exercising. The test looks for problems such as:  Not enough blood reaching a portion of the heart.  The heart muscle not working normally. You may need this test if:  You have heart disease.  You have had abnormal lab results.  You have had heart surgery or a balloon procedure to open up blocked arteries (angioplasty).  You have chest pain.  You have shortness of breath. In this test, a radioactive dye  (tracer) is injected into your bloodstream. After the tracer has traveled to your heart, an imaging device is used to measure how much of the tracer is absorbed by or distributed to various areas of your heart. This procedure is usually done at a hospital and takes 2-4 hours. Tell a health care provider about:  Any allergies you have.  All medicines you are taking, including vitamins, herbs, eye drops, creams, and over-the-counter medicines.  Any problems you or family members have had with anesthetic medicines.  Any blood disorders you have.  Any surgeries you have had.  Any medical conditions you have.  Whether you are pregnant or may be pregnant. What are the risks? Generally, this is a safe procedure. However, problems may occur, including:  Serious chest pain and heart attack. This is only a risk if the stress portion of the test is done.  Rapid heartbeat.  Sensation of warmth in your chest. This usually passes quickly.  Allergic reaction to the tracer. What happens before the procedure?  Ask your health care provider about changing or stopping your regular medicines. This is especially important if you are taking diabetes medicines or blood thinners.  Follow instructions from your health care provider about eating or drinking restrictions.  Remove your jewelry on the day of the procedure. What happens during the procedure?  An IV will be inserted into one of your veins.  Your health care provider will inject a small amount of radioactive tracer through the IV.  You will wait for 20-40 minutes while the tracer travels through your bloodstream.  Your heart activity will be monitored with an electrocardiogram (ECG).  You will lie down on an exam table.  Images of your heart will be taken for about 15-20 minutes.  You may also have a stress test. For this test, one of the following may be done: ? You will exercise on a treadmill or stationary bike. While you  exercise, your heart's activity will be monitored with an ECG, and your blood pressure will be checked. ? You will be given medicines that will increase blood flow to parts of your heart. This is done if you are unable to exercise.  When blood flow to your heart has peaked, a tracer will again be injected through the IV.  After 20-40 minutes, you will get back on the exam table and have more images taken of your heart.  Depending on the type of tracer used, scans may need to be repeated 3-4 hours later.  Your IV line will be removed when the procedure is over. The procedure may vary among health care providers and hospitals. What happens after the procedure?  Unless your health care provider tells you otherwise, you may return to your normal schedule, including diet, activities, and medicines.  Unless your health care provider tells you otherwise, you may increase your fluid intake. This will help to flush the contrast dye from your body. Drink enough fluid to keep your urine pale yellow.  Ask your health care provider, or the department that is doing the test: ? When will my results be ready? ? How will I get my results? Summary  A cardiac nuclear scan measures the blood flow to the heart when a person is resting and when he or she is exercising.  Tell your health care provider if you are pregnant.  Before the procedure, ask your health care provider about changing or stopping your regular medicines. This is especially important if you are taking diabetes medicines or blood thinners.  After the procedure, unless your health care provider tells you otherwise, increase your fluid intake. This will help flush the contrast dye from your body.  After the procedure, unless your health care provider tells you otherwise, you may return to your normal schedule, including diet, activities, and medicines. This information is not intended to replace advice given to you by your health care  provider. Make sure you discuss any questions you have with your health care provider. Document Revised: 04/25/2018 Document Reviewed: 04/25/2018 Elsevier Patient Education  2020 [employer].

## 2020-03-13 ENCOUNTER — Ambulatory Visit: Payer: Self-pay

## 2020-03-22 ENCOUNTER — Other Ambulatory Visit: Payer: Self-pay

## 2020-03-22 ENCOUNTER — Encounter
Admission: RE | Admit: 2020-03-22 | Discharge: 2020-03-22 | Disposition: A | Discharge status: Home / Self Care | Payer: Medicare Other | Source: Ambulatory Visit | Attending: Cardiology | Admitting: Cardiology

## 2020-03-22 DIAGNOSIS — R0600 Dyspnea, unspecified: Secondary | ICD-10-CM | POA: Insufficient documentation

## 2020-03-22 DIAGNOSIS — R0609 Other forms of dyspnea: Secondary | ICD-10-CM

## 2020-03-22 LAB — NM MYOCAR MULTI W/SPECT W/WALL MOTION / EF
LV dias vol: 43 mL (ref 62–150)
LV sys vol: 12 mL
Peak HR: 110 {beats}/min
Percent HR: 79 %
Rest HR: 86 {beats}/min
SDS: 2
SRS: 0
SSS: 0
TID: 0.81

## 2020-03-22 MED ORDER — TECHNETIUM TC 99M TETROFOSMIN IV KIT
31.1200 | PACK | Freq: Once | INTRAVENOUS | Status: AC | PRN
  Administered 2020-03-22: 31.12 via INTRAVENOUS

## 2020-03-22 MED ORDER — REGADENOSON 0.4 MG/5ML IV SOLN
0.4000 mg | Freq: Once | INTRAVENOUS | Status: AC
  Administered 2020-03-22: 0.4 mg via INTRAVENOUS

## 2020-03-22 MED ORDER — TECHNETIUM TC 99M TETROFOSMIN IV KIT
10.6600 | PACK | Freq: Once | INTRAVENOUS | Status: AC | PRN
  Administered 2020-03-22: 10.66 via INTRAVENOUS

## 2020-04-01 ENCOUNTER — Other Ambulatory Visit: Payer: Self-pay

## 2020-04-01 ENCOUNTER — Ambulatory Visit (INDEPENDENT_AMBULATORY_CARE_PROVIDER_SITE_OTHER): Payer: Medicare Other | Admitting: Family Medicine

## 2020-04-01 ENCOUNTER — Encounter: Payer: Self-pay | Admitting: Family Medicine

## 2020-04-01 VITALS — BP 128/75 | HR 89 | Temp 98.1°F | Ht 64.13 in | Wt 138.1 lb

## 2020-04-01 DIAGNOSIS — I10 Essential (primary) hypertension: Secondary | ICD-10-CM

## 2020-04-01 DIAGNOSIS — J449 Chronic obstructive pulmonary disease, unspecified: Secondary | ICD-10-CM | POA: Diagnosis not present

## 2020-04-01 DIAGNOSIS — R634 Abnormal weight loss: Secondary | ICD-10-CM | POA: Diagnosis not present

## 2020-04-01 MED ORDER — MEGESTROL ACETATE 20 MG PO TABS: 20.0000 mg | ORAL_TABLET | Freq: Two times a day (BID) | ORAL | 2 refills | Status: DC | PRN

## 2020-04-01 MED ORDER — TRIAMCINOLONE ACETONIDE 0.1 % EX CREA
1.0000 | TOPICAL_CREAM | Freq: Two times a day (BID) | CUTANEOUS | 1 refills | Status: DC
Start: 2020-04-01 — End: 2021-01-20

## 2020-04-01 NOTE — Progress Notes (Signed)
BP 128/75   Pulse 89   Temp 98.1 F (36.7 C)   Ht 5' 4.13" (1.629 m)   Wt 138 lb 2 oz (62.7 kg)   SpO2 98%   BMI 23.61 kg/m    Subjective:    Patient ID: Ralph Leon, male    DOB: [birth year], 84 y.o.   MRN: [id number]  HPI: Ralph Leon is a 84 y.o. male  Chief Complaint  Patient presents with  . Shortness of Breath  . Hypertension   Here today for follow up several concerns.  HTN - tolerating medication well, not checking home BPs. Denies CP, dizziness, syncope. Tries to eat well, active as tolerated. Working with Cardiology currently for eval of his DOE. Awaiting a stress test and echocardiogram. EKG so far unrevealing.   SOB sxs about the same per patient, not much improvement since addition of Anoro. Denies wheezing, diaphoresis. States sxs mostly only happen when mowing the yard or other physical activities. Recent CT chest negative for nodules or other abnormalities.   Megace seems to be helping with appetite. Has been eating 1-2 additional meals daily. Denies further weight loss.   Relevant past medical, surgical, family and social history reviewed and updated as indicated. Interim medical history since our last visit reviewed. Allergies and medications reviewed and updated.  Review of Systems  Per HPI unless specifically indicated above     Objective:    BP 128/75   Pulse 89   Temp 98.1 F (36.7 C)   Ht 5' 4.13" (1.629 m)   Wt 138 lb 2 oz (62.7 kg)   SpO2 98%   BMI 23.61 kg/m   Wt Readings from Last 3 Encounters:  04/01/20 138 lb 2 oz (62.7 kg)  03/11/20 138 lb 6 oz (62.8 kg)  03/04/20 136 lb (61.7 kg)    Physical Exam Vitals and nursing note reviewed.  Constitutional:      Appearance: Normal appearance.  HENT:     Head: Atraumatic.  Eyes:     Extraocular Movements: Extraocular movements intact.     Conjunctiva/sclera: Conjunctivae normal.  Cardiovascular:     Rate and Rhythm: Normal rate and regular rhythm.  Pulmonary:     Effort:  Pulmonary effort is normal.     Breath sounds: Normal breath sounds.  Musculoskeletal:        General: Normal range of motion.     Cervical back: Normal range of motion and neck supple.  Skin:    General: Skin is warm and dry.  Neurological:     General: No focal deficit present.     Mental Status: He is oriented to person, place, and time.  Psychiatric:        Mood and Affect: Mood normal.        Thought Content: Thought content normal.        Judgment: Judgment normal.     Results for orders placed or performed during the hospital encounter of 03/22/20  NM Myocar Multi W/Spect W/Wall Motion / EF  Result Value Ref Range   Rest HR 86 bpm   Rest BP 152/72 mmHg   Percent HR 79 %   Peak HR 110 bpm   Peak BP 148/70 mmHg   SSS 0    SRS 0    SDS 2    TID 0.81    LV sys vol 12 mL   LV dias vol 43 62 - 150 mL      Assessment & Plan:   Problem  List Items Addressed This Visit      Cardiovascular and Mediastinum   Essential hypertension - Primary    BPs stable and WNL, continue current regimen        Respiratory   COPD (chronic obstructive pulmonary disease) (HCC)    Not much benefit on anoro, but will continue while working up for other causes of SOB       Other Visit Diagnoses    Weight loss       Weight stable since starting megace, continue working on increasing dietary intake       Follow up plan: Return in about 3 months (around 07/02/2020) for SOB, Weight check.

## 2020-04-07 NOTE — Assessment & Plan Note (Signed)
BPs stable and WNL, continue current regimen 

## 2020-04-07 NOTE — Assessment & Plan Note (Signed)
Not much benefit on anoro, but will continue while working up for other causes of SOB

## 2020-04-23 ENCOUNTER — Ambulatory Visit (INDEPENDENT_AMBULATORY_CARE_PROVIDER_SITE_OTHER): Payer: Medicare Other

## 2020-04-23 ENCOUNTER — Other Ambulatory Visit: Payer: Self-pay

## 2020-04-23 DIAGNOSIS — R0609 Other forms of dyspnea: Secondary | ICD-10-CM

## 2020-04-23 DIAGNOSIS — R0600 Dyspnea, unspecified: Secondary | ICD-10-CM

## 2020-04-26 ENCOUNTER — Ambulatory Visit (INDEPENDENT_AMBULATORY_CARE_PROVIDER_SITE_OTHER): Payer: Medicare Other | Admitting: Cardiology

## 2020-04-26 ENCOUNTER — Other Ambulatory Visit: Payer: Self-pay

## 2020-04-26 ENCOUNTER — Encounter: Payer: Self-pay | Admitting: Cardiology

## 2020-04-26 VITALS — BP 124/70 | HR 100 | Ht 67.0 in | Wt 140.0 lb

## 2020-04-26 DIAGNOSIS — R0600 Dyspnea, unspecified: Secondary | ICD-10-CM | POA: Diagnosis not present

## 2020-04-26 DIAGNOSIS — F17200 Nicotine dependence, unspecified, uncomplicated: Secondary | ICD-10-CM

## 2020-04-26 DIAGNOSIS — I340 Nonrheumatic mitral (valve) insufficiency: Secondary | ICD-10-CM | POA: Diagnosis not present

## 2020-04-26 DIAGNOSIS — I10 Essential (primary) hypertension: Secondary | ICD-10-CM

## 2020-04-26 DIAGNOSIS — R0609 Other forms of dyspnea: Secondary | ICD-10-CM

## 2020-04-26 NOTE — Progress Notes (Signed)
Cardiology Office Note:    Date:  04/26/2020   ID:  Ralph Leon, DOB [birth year], MRN [id number]  PCP:  Particia Nearing, PA-C  Cardiologist:  Debbe Odea, MD  Electrophysiologist:  None   Referring MD: Particia Nearing,*   Chief Complaint  Patient presents with  . other    Follow up Lexiscan & Echo. Meds reviewed by the pt. verbally. Pt. c/o shortness of breath on exertion.     History of Present Illness:    Ralph Leon is a 84 y.o. male with a hx of hypertension, stroke, COPD, current smoker x50+ years who presents for follow-up.  He was last seen due to shortness of breath for 3 months.  Patient denies chest pain.  Due to risk factors of smoking, hypertension, echocardiogram and stress test was ordered to evaluate for ischemia and cardiac function.  Patient has no new concerns at this time.  Still smokes.  Presents for test results.   Past Medical History:  Diagnosis Date  . COPD (chronic obstructive pulmonary disease) (HCC)   . Hypertension   . Stroke [facility])     History reviewed. No pertinent surgical history.  Current Medications: Current Meds  Medication Sig  . lisinopril (ZESTRIL) 20 MG tablet Take 1 tablet (20 mg total) by mouth daily.  . megestrol (MEGACE) 20 MG tablet Take 1 tablet (20 mg total) by mouth 2 (two) times daily as needed.  . triamcinolone cream (KENALOG) 0.1 % Apply 1 application topically 2 (two) times daily.  Marland Kitchen umeclidinium-vilanterol (ANORO ELLIPTA) 62.5-25 MCG/INH AEPB Inhale 1 puff into the lungs daily.     Allergies:   Patient has no known allergies.   Social History   Socioeconomic History  . Marital status: Single    Spouse name: Not on file  . Number of children: Not on file  . Years of education: Not on file  . Highest education level: Not on file  Occupational History  . Not on file  Tobacco Use  . Smoking status: Current Every Day Smoker    Packs/day: 0.50    Types: Cigarettes  . Smokeless tobacco: Never  Used  Substance and Sexual Activity  . Alcohol use: Yes    Alcohol/week: 5.0 standard drinks    Types: 5 Cans of beer per week  . Drug use: Not Currently  . Sexual activity: Not Currently  Other Topics Concern  . Not on file  Social History Narrative  . Not on file   Social Determinants of Health   Financial Resource Strain:   . Difficulty of Paying Living Expenses:   Food Insecurity:   . Worried About Programme researcher, broadcasting/film/video in the Last Year:   . Barista in the Last Year:   Transportation Needs:   . Freight forwarder (Medical):   Marland Kitchen Lack of Transportation (Non-Medical):   Physical Activity:   . Days of Exercise per Week:   . Minutes of Exercise per Session:   Stress:   . Feeling of Stress :   Social Connections:   . Frequency of Communication with Friends and Family:   . Frequency of Social Gatherings with Friends and Family:   . Attends Religious Services:   . Active Member of Clubs or Organizations:   . Attends Banker Meetings:   Marland Kitchen Marital Status:      Family History: The patient's family history includes Diabetes in his mother; Heart attack in his father.  ROS:   Please see the  history of present illness.     All other systems reviewed and are negative.  EKGs/Labs/Other Studies Reviewed:    The following studies were reviewed today:   EKG:  EKG is  ordered today.  The ekg ordered today demonstrates normal sinus rhythm, normal ECG.  Recent Labs: 02/19/2020: ALT 8; BUN 18; Creatinine, Ser 1.10; Hemoglobin 14.5; Platelets 212; Potassium 4.5; Sodium 142; TSH 1.680  Recent Lipid Panel    Component Value Date/Time   CHOL 231 (H) 02/19/2020 1608   TRIG 138 02/19/2020 1608   HDL 65 02/19/2020 1608   LDLCALC 142 (H) 02/19/2020 1608    Physical Exam:    VS:  BP 124/70 (BP Location: Left Arm, Patient Position: Sitting, Cuff Size: Normal)   Pulse 100   Ht 5\' 7"  (1.702 m)   Wt 140 lb (63.5 kg)   SpO2 96%   BMI 21.93 kg/m     Wt  Readings from Last 3 Encounters:  04/26/20 140 lb (63.5 kg)  04/01/20 138 lb 2 oz (62.7 kg)  03/11/20 138 lb 6 oz (62.8 kg)     GEN:  Well nourished, well developed in no acute distress HEENT: Normal NECK: No JVD; No carotid bruits LYMPHATICS: No lymphadenopathy CARDIAC: RRR, no murmurs, rubs, gallops RESPIRATORY: Decreased breath sounds but otherwise clear ABDOMEN: Soft, non-tender, non-distended MUSCULOSKELETAL:  No edema; No deformity  SKIN: Warm and dry NEUROLOGIC:  Alert and oriented x 3 PSYCHIATRIC:  Normal affect   ASSESSMENT:    1. DOE (dyspnea on exertion)   2. Mitral valve insufficiency, unspecified etiology   3. Essential hypertension   4. Smoking    PLAN:    In order of problems listed above:  1. Patient withworsening dyspnea on exertion.  He has risk factors of hypertension, current smoker.  Echocardiogram shows normal systolic and diastolic function, EF 55 to 60%.  Mild to moderate mitral regurgitation.  Lexiscan Myoview with no evidence for ischemia.  Low risk scan.  Patient made aware of test results.  Symptoms of shortness of breath likely from current smoking and COPD.  Will refer patient to pulmonary medicine for optimal/additional management of COPD if any. 2. Echocardiogram showed mild to moderate mitral regurgitation.  Plan for repeat echo in about 12 to 18 months. 3. Patient with a history of hypertension.  Blood pressure well controlled.  Continue lisinopril as prescribed. 4. Patient is a current smoker.  Smoking cessation advised.  Follow-up after echo in 1 year`  This note was generated in part or whole with voice recognition software. Voice recognition is usually quite accurate but there are transcription errors that can and very often do occur. I apologize for any typographical errors that were not detected and corrected.  Medication Adjustments/Labs and Tests Ordered: Current medicines are reviewed at length with the patient today.  Concerns  regarding medicines are outlined above.  Orders Placed This Encounter  Procedures  . Ambulatory referral to Pulmonology  . EKG 12-Lead  . ECHOCARDIOGRAM COMPLETE   No orders of the defined types were placed in this encounter.   Patient Instructions  Medication Instructions:  Your physician recommends that you continue on your current medications as directed. Please refer to the Current Medication list given to you today.  *If you need a refill on your cardiac medications before your next appointment, please call your pharmacy*   Lab Work: None Ordered If you have labs (blood work) drawn today and your tests are completely normal, you will receive your results only  by: . MyChart Message (if you have MyChart) OR . A paper copy in the mail If you have any lab test that is abnormal or we need to change your treatment, we will call you to review the results.   Testing/Procedures:  Your physician has requested that you have an echocardiogram. Echocardiography is a painless test that uses sound waves to create images of your heart. It provides your doctor with information about the size and shape of your heart and how well your heart's chambers and valves are working. This procedure takes approximately one hour. There are no restrictions for this procedure.  This will be scheduled just prior to your 1 year follow up.   Follow-Up:  At [facility], you and your health needs are our priority.  As part of our continuing mission to provide you with exceptional heart care, we have created designated Provider Care Teams.  These Care Teams include your primary Cardiologist (physician) and Advanced Practice Providers (APPs -  Physician Assistants and Nurse Practitioners) who all work together to provide you with the care you need, when you need it.  We recommend signing up for the patient portal called "MyChart".  Sign up information is provided on this After Visit Summary.  MyChart is used to  connect with patients for Virtual Visits (Telemedicine).  Patients are able to view lab/test results, encounter notes, upcoming appointments, etc.  Non-urgent messages can be sent to your provider as well.   To learn more about what you can do with MyChart, go to [email or web address].    Your next appointment:   1 year,  after echo to be scheduled just prior to.  The format for your next appointment:   In Person  Provider:   Debbe Odea, MD   Other Instructions   Echocardiogram An echocardiogram is a procedure that uses painless sound waves (ultrasound) to produce an image of the heart. Images from an echocardiogram can provide important information about:  Signs of coronary artery disease (CAD).  Aneurysm detection. An aneurysm is a weak or damaged part of an artery wall that bulges out from the normal force of blood pumping through the body.  Heart size and shape. Changes in the size or shape of the heart can be associated with certain conditions, including heart failure, aneurysm, and CAD.  Heart muscle function.  Heart valve function.  Signs of a past heart attack.  Fluid buildup around the heart.  Thickening of the heart muscle.  A tumor or infectious growth around the heart valves. Tell a health care provider about:  Any allergies you have.  All medicines you are taking, including vitamins, herbs, eye drops, creams, and over-the-counter medicines.  Any blood disorders you have.  Any surgeries you have had.  Any medical conditions you have.  Whether you are pregnant or may be pregnant. What are the risks? Generally, this is a safe procedure. However, problems may occur, including:  Allergic reaction to dye (contrast) that may be used during the procedure. What happens before the procedure? No specific preparation is needed. You may eat and drink normally. What happens during the procedure?   An IV tube may be inserted into one of your  veins.  You may receive contrast through this tube. A contrast is an injection that improves the quality of the pictures from your heart.  A gel will be applied to your chest.  A wand-like tool (transducer) will be moved over your chest. The gel will help to transmit the  sound waves from the transducer.  The sound waves will harmlessly bounce off of your heart to allow the heart images to be captured in real-time motion. The images will be recorded on a computer. The procedure may vary among health care providers and hospitals. What happens after the procedure?  You may return to your normal, everyday life, including diet, activities, and medicines, unless your health care provider tells you not to do that. Summary  An echocardiogram is a procedure that uses painless sound waves (ultrasound) to produce an image of the heart.  Images from an echocardiogram can provide important information about the size and shape of your heart, heart muscle function, heart valve function, and fluid buildup around your heart.  You do not need to do anything to prepare before this procedure. You may eat and drink normally.  After the echocardiogram is completed, you may return to your normal, everyday life, unless your health care provider tells you not to do that. This information is not intended to replace advice given to you by your health care provider. Make sure you discuss any questions you have with your health care provider. Document Revised: 03/02/2019 Document Reviewed: 12/12/2016 Elsevier Patient Education  2020 [place], Kate Sable, MD  04/26/2020 3:22 PM    [facility]

## 2020-04-26 NOTE — Patient Instructions (Signed)
Medication Instructions:  Your physician recommends that you continue on your current medications as directed. Please refer to the Current Medication list given to you today.  *If you need a refill on your cardiac medications before your next appointment, please call your pharmacy*   Lab Work: None Ordered If you have labs (blood work) drawn today and your tests are completely normal, you will receive your results only by: Marland Kitchen MyChart Message (if you have MyChart) OR . A paper copy in the mail If you have any lab test that is abnormal or we need to change your treatment, we will call you to review the results.   Testing/Procedures:  Your physician has requested that you have an echocardiogram. Echocardiography is a painless test that uses sound waves to create images of your heart. It provides your doctor with information about the size and shape of your heart and how well your heart's chambers and valves are working. This procedure takes approximately one hour. There are no restrictions for this procedure.  This will be scheduled just prior to your 1 year follow up.   Follow-Up:  At [facility], you and your health needs are our priority.  As part of our continuing mission to provide you with exceptional heart care, we have created designated Provider Care Teams.  These Care Teams include your primary Cardiologist (physician) and Advanced Practice Providers (APPs -  Physician Assistants and Nurse Practitioners) who all work together to provide you with the care you need, when you need it.  We recommend signing up for the patient portal called "MyChart".  Sign up information is provided on this After Visit Summary.  MyChart is used to connect with patients for Virtual Visits (Telemedicine).  Patients are able to view lab/test results, encounter notes, upcoming appointments, etc.  Non-urgent messages can be sent to your provider as well.   To learn more about what you can do with MyChart,  go to [email or web address].    Your next appointment:   1 year,  after echo to be scheduled just prior to.  The format for your next appointment:   In Person  Provider:   Debbe Odea, MD   Other Instructions   Echocardiogram An echocardiogram is a procedure that uses painless sound waves (ultrasound) to produce an image of the heart. Images from an echocardiogram can provide important information about:  Signs of coronary artery disease (CAD).  Aneurysm detection. An aneurysm is a weak or damaged part of an artery wall that bulges out from the normal force of blood pumping through the body.  Heart size and shape. Changes in the size or shape of the heart can be associated with certain conditions, including heart failure, aneurysm, and CAD.  Heart muscle function.  Heart valve function.  Signs of a past heart attack.  Fluid buildup around the heart.  Thickening of the heart muscle.  A tumor or infectious growth around the heart valves. Tell a health care provider about:  Any allergies you have.  All medicines you are taking, including vitamins, herbs, eye drops, creams, and over-the-counter medicines.  Any blood disorders you have.  Any surgeries you have had.  Any medical conditions you have.  Whether you are pregnant or may be pregnant. What are the risks? Generally, this is a safe procedure. However, problems may occur, including:  Allergic reaction to dye (contrast) that may be used during the procedure. What happens before the procedure? No specific preparation is needed. You may eat and drink  normally. What happens during the procedure?   An IV tube may be inserted into one of your veins.  You may receive contrast through this tube. A contrast is an injection that improves the quality of the pictures from your heart.  A gel will be applied to your chest.  A wand-like tool (transducer) will be moved over your chest. The gel will help to  transmit the sound waves from the transducer.  The sound waves will harmlessly bounce off of your heart to allow the heart images to be captured in real-time motion. The images will be recorded on a computer. The procedure may vary among health care providers and hospitals. What happens after the procedure?  You may return to your normal, everyday life, including diet, activities, and medicines, unless your health care provider tells you not to do that. Summary  An echocardiogram is a procedure that uses painless sound waves (ultrasound) to produce an image of the heart.  Images from an echocardiogram can provide important information about the size and shape of your heart, heart muscle function, heart valve function, and fluid buildup around your heart.  You do not need to do anything to prepare before this procedure. You may eat and drink normally.  After the echocardiogram is completed, you may return to your normal, everyday life, unless your health care provider tells you not to do that. This information is not intended to replace advice given to you by your health care provider. Make sure you discuss any questions you have with your health care provider. Document Revised: 03/02/2019 Document Reviewed: 12/12/2016 Elsevier Patient Education  [place].

## 2020-05-29 ENCOUNTER — Ambulatory Visit (INDEPENDENT_AMBULATORY_CARE_PROVIDER_SITE_OTHER): Payer: Medicare Other | Admitting: Pulmonary Disease

## 2020-05-29 ENCOUNTER — Encounter: Payer: Self-pay | Admitting: Pulmonary Disease

## 2020-05-29 ENCOUNTER — Other Ambulatory Visit: Payer: Self-pay

## 2020-05-29 VITALS — BP 128/72 | HR 92 | Temp 97.8°F | Ht 67.0 in | Wt 140.6 lb

## 2020-05-29 DIAGNOSIS — R12 Heartburn: Secondary | ICD-10-CM | POA: Diagnosis not present

## 2020-05-29 DIAGNOSIS — R0602 Shortness of breath: Secondary | ICD-10-CM

## 2020-05-29 DIAGNOSIS — F17210 Nicotine dependence, cigarettes, uncomplicated: Secondary | ICD-10-CM

## 2020-05-29 DIAGNOSIS — J449 Chronic obstructive pulmonary disease, unspecified: Secondary | ICD-10-CM | POA: Diagnosis not present

## 2020-05-29 MED ORDER — BREZTRI AEROSPHERE 160-9-4.8 MCG/ACT IN AERO: 2.0000 | INHALATION_SPRAY | Freq: Two times a day (BID) | RESPIRATORY_TRACT | 0 refills | Status: DC

## 2020-05-29 NOTE — Patient Instructions (Signed)
What we discussed today:  You have pulmonary emphysema  We are giving you a trial of different inhaler this call Breztri 2 puffs twice a day.  Rinse your mouth well after using please let us know how this inhaler does for you so we can call a prescription to your pharmacy  We are scheduling breathing tests  We will see you in follow-up in 2 months time call sooner should any new problems arise

## 2020-05-29 NOTE — Progress Notes (Signed)
Subjective:    Patient ID: Ralph Leon, male    DOB: 1936/02/04, 84 y.o.   MRN: [id number]  HPI Is an 84 year old current smoker (half pack of cigarettes per day) who presents for evaluation of shortness of breath.  He states that he has had shortness of breath for approximately 1 year but worse over the last 6 months.  He notes decreased vim, vigor and vitality.  He states that he sometimes coughs in the morning usually productive of whitish sputum.  No hemoptysis.  He has not had any chest pain.  He states that mowing his lawn does not cause him a problem however lifting his arms does.  Lifting objects also makes him short of breath.  Rest helps him.  He has not tried any inhalers with the exception of Anoro which he tried for approximately 1 month and he did not feel it helped any.  Did not renew this.  He has not had any fevers, chills or sweats.  He has not had orthopnea or paroxysmal nocturnal dyspnea.  No lower extremity edema.  Does have frequent heartburn.  A CT  scan of the chest was obtained in April 2021 showing coronary artery calcifications and severe bullous emphysema.  Patient also had a dilated esophagus.  Echocardiogram done on 25 April 2020 showed moderate mitral regurgitation.  A Lexiscan Myoview was interpreted as low risk for ischemia.  ECG performed on 26 April 2020 was normal, prior EKG showed possible old septal infarct.   Review of Systems A 10 point review of systems was performed and it is as noted above otherwise negative.  Past Medical History:  Diagnosis Date   COPD (chronic obstructive pulmonary disease) (HCC)    Hypertension    Stroke (HCC)    No past surgical history on file.  Family History  Problem Relation Age of Onset   Diabetes Mother    Heart attack Father    Social History   Tobacco Use   Smoking status: Current Every Day Smoker    Packs/day: 1.00    Years: 64.00    Pack years: 64.00    Types: Cigarettes   Smokeless tobacco: Never  Used   Tobacco comment: 0.5 pack daily-05/29/2020  Substance Use Topics   Alcohol use: Yes    Alcohol/week: 5.0 standard drinks    Types: 5 Cans of beer per week  Has been smoking since age 89.  Smoked 1 pack/day until recently when he has cut back to half a pack per day.  He drinks beer only on occasion.  He has 6 cats at home.  No unusual pets, no birds.  He worked in the [employer] for 2 years in 1956 1957.  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  He has not had any foreign travel.  Tested for tuberculosis previously, negative.  No Known Allergies   Current Meds  Medication Sig   lisinopril (ZESTRIL) 20 MG tablet Take 1 tablet (20 mg total) by mouth daily.   megestrol (MEGACE) 20 MG tablet Take 1 tablet (20 mg total) by mouth 2 (two) times daily as needed.   triamcinolone cream (KENALOG) 0.1 % Apply 1 application topically 2 (two) times daily.      Objective:   Physical Exam BP 128/72 (BP Location: Left Arm, Cuff Size: Normal)    Pulse 92    Temp 97.8 F (36.6 C) (Temporal)    Ht 5'  7" (1.702 m)    Wt 140 lb 9.6 oz (63.8 kg)    SpO2 96%    BMI 22.02 kg/m   GENERAL: Thin elderly male, no acute distress.  Fully ambulatory HEAD: Normocephalic, atraumatic.  EYES: Pupils equal, round, reactive to light.  No scleral icterus.  MOUTH: Nose/mouth/throat not examined due to masking requirements for COVID 19. NECK: Supple. No thyromegaly. Trachea midline. No JVD.  No adenopathy. PULMONARY: Distant breath sounds, coarse, no other adventitious sounds. CARDIOVASCULAR: S1 and S2. Regular rate and rhythm.  Grade 2/6 systolic ejection murmur left sternal border with radiation to axilla. GASTROINTESTINAL: Nondistended. MUSCULOSKELETAL: No joint deformity, no clubbing, no edema.  NEUROLOGIC: Very hard of hearing, awake, alert, no focal deficit noted.  No gait disturbance noted.  Speech is fluent. SKIN: Intact,warm,dry.  On limited exam  no rashes.   PSYCH: Mood and behavior appropriate.  Representative slice of chest CT performed in April shows very severe emphysema dilated esophagus and apical scarring.         Assessment & Plan:     ICD-10-CM   1. SOB (shortness of breath)  R06.02 Pulmonary Function Test [facility] Only   Patient has significant emphysema on CT scan of the chest Will obtain PFTs for evaluation of this issue   2. COPD with chronic bronchitis and emphysema (HCC)  J44.9    COPD with chronic bronchitis and emphysema clinical impression Emphysema noted on CT, quite advanced PFTs Trial of Breztri 2 inhalations twice a day   3. Heartburn  R12    Recommend antireflux measures Consider PPI, defer to primary care provider  4. Tobacco dependence due to cigarettes  F17.210    Patient was counseled regards to discontinuation of smoking Total counseling time 3 to 5 minutes   Discussion:  Patient has noted gradual onset of dyspnea for a year escalating over the last 6 months.  He did not seem to have any significant relief with Anoro Ellipta.  He needs evaluation of lung function as his FEV1 is unknown.  Will obtain PFTs to evaluate this issue further.  We will give him a trial of Breztri 2 inhalations twice a day to see if this helps his symptoms.  He is to let us know if this works and we will send a prescription to his pharmacy.  Of note he is having frequent heartburn.  We discussed antireflux measures.  He may need a trial of PPI but will defer this to his primary care provider.  This will need to be taken into consideration as the patient is on Megace which can cause significant dyspepsia.  We will see him in follow-up in 2 weeks time he is to contact us prior to that time should any new difficulties arise.   Gailen Shelter, MD [facility]   *This note was dictated using voice recognition software/Dragon.  Despite best efforts to proofread, errors can occur which can change the meaning.  Any change was  purely unintentional.

## 2020-06-07 ENCOUNTER — Telehealth: Payer: Self-pay

## 2020-06-07 NOTE — Telephone Encounter (Signed)
Pt is aware of date/time of covid test and voiced his understanding.  °Nothing further is needed.  °

## 2020-06-07 NOTE — Telephone Encounter (Signed)
Lm to make pt aware of date/time of covid test prior to PFT. [facility]10/[facility]

## 2020-06-10 ENCOUNTER — Other Ambulatory Visit
Admission: RE | Admit: 2020-06-10 | Discharge: 2020-06-10 | Disposition: A | Discharge status: Home / Self Care | Payer: Medicare Other | Source: Ambulatory Visit | Attending: Pulmonary Disease | Admitting: Pulmonary Disease

## 2020-06-10 ENCOUNTER — Other Ambulatory Visit: Payer: Self-pay

## 2020-06-10 DIAGNOSIS — R0600 Dyspnea, unspecified: Secondary | ICD-10-CM | POA: Diagnosis not present

## 2020-06-10 DIAGNOSIS — Z01812 Encounter for preprocedural laboratory examination: Secondary | ICD-10-CM | POA: Diagnosis present

## 2020-06-10 DIAGNOSIS — Z20822 Contact with and (suspected) exposure to covid-19: Secondary | ICD-10-CM | POA: Insufficient documentation

## 2020-06-10 LAB — SARS CORONAVIRUS 2 (TAT 6-24 HRS): SARS Coronavirus 2: NEGATIVE

## 2020-06-11 ENCOUNTER — Ambulatory Visit: Payer: Medicare Other | Attending: Pulmonary Disease

## 2020-06-11 DIAGNOSIS — R0602 Shortness of breath: Secondary | ICD-10-CM | POA: Diagnosis present

## 2020-06-11 DIAGNOSIS — F17210 Nicotine dependence, cigarettes, uncomplicated: Secondary | ICD-10-CM | POA: Insufficient documentation

## 2020-06-11 MED ORDER — ALBUTEROL SULFATE (2.5 MG/3ML) 0.083% IN NEBU
2.5000 mg | INHALATION_SOLUTION | Freq: Once | RESPIRATORY_TRACT | Status: AC
  Administered 2020-06-11: 2.5 mg via RESPIRATORY_TRACT
  Filled 2020-06-11: qty 3

## 2020-07-03 ENCOUNTER — Ambulatory Visit (INDEPENDENT_AMBULATORY_CARE_PROVIDER_SITE_OTHER): Payer: Medicare Other | Admitting: Family Medicine

## 2020-07-03 ENCOUNTER — Encounter: Payer: Self-pay | Admitting: Family Medicine

## 2020-07-03 ENCOUNTER — Other Ambulatory Visit: Payer: Self-pay

## 2020-07-03 VITALS — BP 111/64 | HR 86 | Temp 98.2°F | Wt 140.0 lb

## 2020-07-03 DIAGNOSIS — J449 Chronic obstructive pulmonary disease, unspecified: Secondary | ICD-10-CM

## 2020-07-03 DIAGNOSIS — I10 Essential (primary) hypertension: Secondary | ICD-10-CM

## 2020-07-03 DIAGNOSIS — R7309 Other abnormal glucose: Secondary | ICD-10-CM

## 2020-07-03 DIAGNOSIS — R5383 Other fatigue: Secondary | ICD-10-CM

## 2020-07-03 DIAGNOSIS — Z8673 Personal history of transient ischemic attack (TIA), and cerebral infarction without residual deficits: Secondary | ICD-10-CM | POA: Diagnosis not present

## 2020-07-03 DIAGNOSIS — R636 Underweight: Secondary | ICD-10-CM

## 2020-07-03 NOTE — Progress Notes (Signed)
BP 111/64   Pulse 86   Temp 98.2 F (36.8 C) (Oral)   Wt 140 lb (63.5 kg)   SpO2 98%   BMI 21.93 kg/m    Subjective:    Patient ID: Ralph Leon, male    DOB: November 01, 1936, 84 y.o.   MRN: [id number]  HPI: Ralph Leon is a 84 y.o. male  Chief Complaint  Patient presents with  . Shortness of Breath  . Weight Check   Here today for f/u SOB and weight/appetite.   Following with Pulmonology for emphysema. Started on breztri but does not really feel like that has been helping much. Still getting pretty winded with minimal activity. Denies SOB at rest, wheezing, significant cough.   Taking megace prn which he believes to help with his appetite. Feels he's eating adequate calories at this time. Denies side effects.   Relevant past medical, surgical, family and social history reviewed and updated as indicated. Interim medical history since our last visit reviewed. Allergies and medications reviewed and updated.  Review of Systems  Per HPI unless specifically indicated above     Objective:    BP 111/64   Pulse 86   Temp 98.2 F (36.8 C) (Oral)   Wt 140 lb (63.5 kg)   SpO2 98%   BMI 21.93 kg/m   Wt Readings from Last 3 Encounters:  07/03/20 140 lb (63.5 kg)  05/29/20 140 lb 9.6 oz (63.8 kg)  04/26/20 140 lb (63.5 kg)    Physical Exam Vitals and nursing note reviewed.  Constitutional:      Appearance: Normal appearance. He is not ill-appearing.     Comments: underweight  HENT:     Head: Atraumatic.  Eyes:     Extraocular Movements: Extraocular movements intact.     Conjunctiva/sclera: Conjunctivae normal.  Cardiovascular:     Rate and Rhythm: Normal rate and regular rhythm.  Pulmonary:     Effort: Pulmonary effort is normal.     Comments: Breath sounds decreased b/l  Musculoskeletal:        General: Normal range of motion.     Cervical back: Normal range of motion and neck supple.  Skin:    General: Skin is warm and dry.  Neurological:     General: No  focal deficit present.     Mental Status: He is oriented to person, place, and time.  Psychiatric:        Mood and Affect: Mood normal.        Thought Content: Thought content normal.        Judgment: Judgment normal.     Results for orders placed or performed in visit on 07/03/20  CBC with Differential/Platelet  Result Value Ref Range   WBC 8.2 3.4 - 10.8 x10E3/uL   RBC 3.85 (L) 4.14 - 5.80 x10E6/uL   Hemoglobin 11.6 (L) 13.0 - 17.7 g/dL   Hematocrit 43.3 (L) 29.5 - 51.0 %   MCV 89 79 - 97 fL   MCH 30.1 26.6 - 33.0 pg   MCHC 33.7 31 - 35 g/dL   RDW 18.8 41.6 - 60.6 %   Platelets 263 150 - 450 x10E3/uL   Neutrophils 65 Not Estab. %   Lymphs 23 Not Estab. %   Monocytes 8 Not Estab. %   Eos 3 Not Estab. %   Basos 1 Not Estab. %   Neutrophils Absolute 5.3 1 - 7 x10E3/uL   Lymphocytes Absolute 1.9 0 - 3 x10E3/uL   Monocytes Absolute 0.7 0 -  0 x10E3/uL   EOS (ABSOLUTE) 0.3 0.0 - 0.4 x10E3/uL   Basophils Absolute 0.0 0 - 0 x10E3/uL   Immature Granulocytes 0 Not Estab. %   Immature Grans (Abs) 0.0 0.0 - 0.1 x10E3/uL  Comprehensive metabolic panel  Result Value Ref Range   Glucose 116 (H) 65 - 99 mg/dL   BUN 26 8 - 27 mg/dL   Creatinine, Ser 6.76 0.76 - 1.27 mg/dL   GFR calc non Af Amer 60 >59 mL/min/1.73   GFR calc Af Amer 70 >59 mL/min/1.73   BUN/Creatinine Ratio 23 10 - 24   Sodium 139 134 - 144 mmol/L   Potassium 4.5 3.5 - 5.2 mmol/L   Chloride 105 96 - 106 mmol/L   CO2 20 20 - 29 mmol/L   Calcium 9.3 8.6 - 10.2 mg/dL   Total Protein 6.6 6.0 - 8.5 g/dL   Albumin 4.3 3.6 - 4.6 g/dL   Globulin, Total 2.3 1.5 - 4.5 g/dL   Albumin/Globulin Ratio 1.9 1.2 - 2.2   Bilirubin Total 0.4 0.0 - 1.2 mg/dL   Alkaline Phosphatase 65 48 - 121 IU/L   AST 9 0 - 40 IU/L   ALT 6 0 - 44 IU/L  TSH  Result Value Ref Range   TSH 1.690 0.450 - 4.500 uIU/mL  Lipid Panel w/o Chol/HDL Ratio  Result Value Ref Range   Cholesterol, Total 238 (H) 100 - 199 mg/dL   Triglycerides 195 0 - 149  mg/dL   HDL 49 >09 mg/dL   VLDL Cholesterol Cal 23 5 - 40 mg/dL   LDL Chol Calc (NIH) 326 (H) 0 - 99 mg/dL  HgB Z1I  Result Value Ref Range   Hgb A1c MFr Bld 5.9 (H) 4.8 - 5.6 %   Est. average glucose Bld gHb Est-mCnc 123 mg/dL      Assessment & Plan:   Problem List Items Addressed This Visit      Cardiovascular and Mediastinum   Essential hypertension - Primary    Stable and well controlled, continue present medications      Relevant Medications   rosuvastatin (CRESTOR) 10 MG tablet   Other Relevant Orders   CBC with Differential/Platelet (Completed)   Comprehensive metabolic panel (Completed)     Respiratory   COPD (chronic obstructive pulmonary disease) (HCC)    Following with Pulmonology, still quite symptomatic. Continue per their recommendations        Other   History of CVA (cerebrovascular accident)    Agreeable to adding crestor for risk prevention. Recheck lipids, continue to monitor      Relevant Orders   Lipid Panel w/o Chol/HDL Ratio (Completed)    Other Visit Diagnoses    Fatigue, unspecified type       Relevant Orders   TSH (Completed)   Elevated glucose       Relevant Orders   HgB A1c (Completed)   Underweight       Maintaining weight, though still quite low. Continue megace, add boost drinks to supplement calories       Follow up plan: Return in about 3 months (around 10/03/2020) for Follow up.

## 2020-07-03 NOTE — Patient Instructions (Signed)
Can try a vitamin D supplement

## 2020-07-04 LAB — LIPID PANEL W/O CHOL/HDL RATIO
Cholesterol, Total: 238 mg/dL — ABNORMAL HIGH (ref 100–199)
HDL: 49 mg/dL (ref >39)
LDL Chol Calc (NIH): 166 mg/dL — ABNORMAL HIGH (ref 0–99)
Triglycerides: 127 mg/dL (ref 0–149)
VLDL Cholesterol Cal: 23 mg/dL (ref 5–40)

## 2020-07-04 LAB — COMPREHENSIVE METABOLIC PANEL
ALT: 6 IU/L (ref 0–44)
AST: 9 IU/L (ref 0–40)
Albumin/Globulin Ratio: 1.9 (ref 1.2–2.2)
Albumin: 4.3 g/dL (ref 3.6–4.6)
Alkaline Phosphatase: 65 IU/L (ref 48–121)
BUN/Creatinine Ratio: 23 (ref 10–24)
BUN: 26 mg/dL (ref 8–27)
Bilirubin Total: 0.4 mg/dL (ref 0.0–1.2)
CO2: 20 mmol/L (ref 20–29)
Calcium: 9.3 mg/dL (ref 8.6–10.2)
Chloride: 105 mmol/L (ref 96–106)
Creatinine, Ser: 1.13 mg/dL (ref 0.76–1.27)
GFR calc Af Amer: 70 mL/min/{1.73_m2} (ref >59)
GFR calc non Af Amer: 60 mL/min/{1.73_m2} (ref >59)
Globulin, Total: 2.3 g/dL (ref 1.5–4.5)
Glucose: 116 mg/dL — ABNORMAL HIGH (ref 65–99)
Potassium: 4.5 mmol/L (ref 3.5–5.2)
Sodium: 139 mmol/L (ref 134–144)
Total Protein: 6.6 g/dL (ref 6.0–8.5)

## 2020-07-04 LAB — CBC WITH DIFFERENTIAL/PLATELET
Basophils Absolute: 0 10*3/uL (ref 0.0–0.2)
Basos: 1 %
EOS (ABSOLUTE): 0.3 10*3/uL (ref 0.0–0.4)
Eos: 3 %
Hematocrit: 34.4 % — ABNORMAL LOW (ref 37.5–51.0)
Hemoglobin: 11.6 g/dL — ABNORMAL LOW (ref 13.0–17.7)
Immature Grans (Abs): 0 10*3/uL (ref 0.0–0.1)
Immature Granulocytes: 0 %
Lymphocytes Absolute: 1.9 10*3/uL (ref 0.7–3.1)
Lymphs: 23 %
MCH: 30.1 pg (ref 26.6–33.0)
MCHC: 33.7 g/dL (ref 31.5–35.7)
MCV: 89 fL (ref 79–97)
Monocytes Absolute: 0.7 10*3/uL (ref 0.1–0.9)
Monocytes: 8 %
Neutrophils Absolute: 5.3 10*3/uL (ref 1.4–7.0)
Neutrophils: 65 %
Platelets: 263 10*3/uL (ref 150–450)
RBC: 3.85 x10E6/uL — ABNORMAL LOW (ref 4.14–5.80)
RDW: 14.2 % (ref 11.6–15.4)
WBC: 8.2 10*3/uL (ref 3.4–10.8)

## 2020-07-04 LAB — HEMOGLOBIN A1C
Est. average glucose Bld gHb Est-mCnc: 123 mg/dL
Hgb A1c MFr Bld: 5.9 % — ABNORMAL HIGH (ref 4.8–5.6)

## 2020-07-04 LAB — TSH: TSH: 1.69 u[IU]/mL (ref 0.450–4.500)

## 2020-07-05 MED ORDER — ROSUVASTATIN CALCIUM 10 MG PO TABS: 10.0000 mg | ORAL_TABLET | Freq: Every day | ORAL | 1 refills | Status: DC

## 2020-07-07 NOTE — Assessment & Plan Note (Signed)
Following with Pulmonology, still quite symptomatic. Continue per their recommendations

## 2020-07-07 NOTE — Assessment & Plan Note (Signed)
Stable and well controlled, continue present medications 

## 2020-07-07 NOTE — Assessment & Plan Note (Signed)
Agreeable to adding crestor for risk prevention. Recheck lipids, continue to monitor

## 2020-07-08 ENCOUNTER — Encounter: Payer: Self-pay | Admitting: Family Medicine

## 2020-07-08 DIAGNOSIS — R7301 Impaired fasting glucose: Secondary | ICD-10-CM | POA: Insufficient documentation

## 2020-08-08 ENCOUNTER — Other Ambulatory Visit: Payer: Self-pay

## 2020-08-08 ENCOUNTER — Ambulatory Visit (INDEPENDENT_AMBULATORY_CARE_PROVIDER_SITE_OTHER): Payer: Medicare Other | Admitting: Pulmonary Disease

## 2020-08-08 ENCOUNTER — Encounter: Payer: Self-pay | Admitting: Pulmonary Disease

## 2020-08-08 VITALS — BP 124/70 | HR 98 | Temp 97.3°F | Ht 67.0 in | Wt 139.0 lb

## 2020-08-08 DIAGNOSIS — R0602 Shortness of breath: Secondary | ICD-10-CM

## 2020-08-08 DIAGNOSIS — J449 Chronic obstructive pulmonary disease, unspecified: Secondary | ICD-10-CM

## 2020-08-08 DIAGNOSIS — F17210 Nicotine dependence, cigarettes, uncomplicated: Secondary | ICD-10-CM | POA: Diagnosis not present

## 2020-08-08 MED ORDER — STIOLTO RESPIMAT 2.5-2.5 MCG/ACT IN AERS: 2.0000 | INHALATION_SPRAY | Freq: Every day | RESPIRATORY_TRACT | 0 refills | Status: AC

## 2020-08-08 NOTE — Patient Instructions (Addendum)
We are going to give you a trial of a new inhaler called Stiolto 2 inhalations daily  Your shortness of breath is likely due to many factors 1 of which is COPD, another is your leaky valve and the heart.  We are going to check your oxygen level at nighttime.  We will see you in follow-up in 3 months time call sooner should any new problems arise.

## 2020-08-08 NOTE — Progress Notes (Signed)
Subjective:    Patient ID: Ralph Leon, male    DOB: Jun 26, 1936, 84 y.o.   MRN: [id number]  HPI 84 year old current smoker (half PPD) who presents for follow-up on shortness of breath.  Patient's initial date of evaluation was 29 May 2020.  Please see that note for details.  PFTs on 20 July that showed mixed restrictive and obstructive lung disease.The degree of obstruction is likely underestimated by the concomitant restriction.  Diffusion capacity was mildly reduced.  Restriction may be secondary to large multiple blebs in the upper lobes as well as pleural parenchymal scarring noted on prior CT.  2D echo was performed in June 2021 and it does show moderate MR.  The degree of restriction is mild.  Previously he had tried Anoro with no significant help however he felt that the powder preparation was difficult for him to take.  He was given a trial of Breztri on his initial evaluation.  Felt that this helped him however he felt that the cost of the medication will be prohibitive.  He is currently not using any inhalers.  He also complains of "dreaming a lot" and being very hard for him to get up in the mornings.   He unfortunately continues to smoke half a pack of cigarettes per day.  Counseled with regards discontinuation of smoking.  Review of Systems A 10 point review of systems was performed and it is as noted above otherwise negative.  No Known Allergies  Current Meds  Medication Sig  . Budeson-Glycopyrrol-Formoterol (BREZTRI AEROSPHERE) 160-9-4.8 MCG/ACT AERO Inhale 2 puffs into the lungs in the morning and at bedtime.  Marland Kitchen lisinopril (ZESTRIL) 20 MG tablet Take 1 tablet (20 mg total) by mouth daily.  . megestrol (MEGACE) 20 MG tablet Take 1 tablet (20 mg total) by mouth 2 (two) times daily as needed.  . rosuvastatin (CRESTOR) 10 MG tablet Take 1 tablet (10 mg total) by mouth daily.  Marland Kitchen triamcinolone cream (KENALOG) 0.1 % Apply 1 application topically 2 (two) times daily.     There is  no immunization history on file for this patient. Declines Covid vaccine.      Objective:   Physical Exam BP 124/70 (BP Location: Left Arm)   Pulse 98   Temp (!) 97.3 F (36.3 C) (Temporal)   Ht 5\' 7"  (1.702 m)   Wt 139 lb (63 kg)   SpO2 96%   BMI 21.77 kg/m   GENERAL: Thin elderly male, no acute distress.  Fully ambulatory HEAD: Normocephalic, atraumatic.  EYES: Pupils equal, round, reactive to light.  No scleral icterus.  MOUTH: Nose/mouth/throat not examined due to masking requirements for COVID 19. NECK: Supple. No thyromegaly. Trachea midline. No JVD.  No adenopathy. PULMONARY: Distant breath sounds, coarse, no other adventitious sounds. CARDIOVASCULAR: S1 and S2. Regular rate and rhythm.  Grade 2/6 systolic ejection murmur left sternal border with radiation to axilla. GASTROINTESTINAL: Nondistended. MUSCULOSKELETAL: No joint deformity, no clubbing, no edema.  NEUROLOGIC: Very hard of hearing, awake, alert, no focal deficit noted.  No gait disturbance noted. Speech is fluent. SKIN: Intact,warm,dry.  On limited exam no rashes.   PSYCH: Mood and behavior appropriate.     Assessment & Plan:     ICD-10-CM   1. SOB (shortness of breath)  R06.02    Multifactorial Combination of COPD and cardiac (MR)  2. COPD with chronic bronchitis and emphysema (HCC)  J44.9 Pulse oximetry, overnight   Trial of Stiolto 2 inhalations daily This shows better coverage under his  current plan Will obtain overnight oximetry Query nocturnal hypoxemia due to COPD  3. Tobacco dependence due to cigarettes  F17.210    Patient counseled with regards to discontinuation of smoking Total counseling time 3 to 5 minutes   Orders Placed This Encounter  Procedures  . Pulse oximetry, overnight    On roomair DME:ANY    Standing Status:   Future    Standing Expiration Date:   08/08/2021   Meds ordered this encounter  Medications  . Tiotropium Bromide-Olodaterol (STIOLTO RESPIMAT) 2.5-2.5 MCG/ACT AERS      Sig: Inhale 2 puffs into the lungs daily for 1 day.    Dispense:  1 each    Refill:  0    Order Specific Question:   Lot Number?    Answer:   [id number]    Order Specific Question:   Expiration Date?    Answer:   04/23/2022    Order Specific Question:   Manufacturer?    Answer:   [employer] [[id number]]    Order Specific Question:   Quantity    Answer:   2   Discussion:  Patient's dyspnea is likely multifactorial.  Suspect element of COPD as well as cardiac he does have significant mitral regurgitation.  He unfortunately continues to smoke which is going to aggravate this issue. Recommend smoking cessation.  Trial of Stiolto 2 inhalations daily.  He has been having some symptoms consistent with nocturnal hypoxemia likely related to his COPD, will obtain overnight oximetry to evaluate this issue.  We will see him in follow-up in 3 months time he is to contact us prior to that time should any new difficulties arise.  He is to notify us if the Stiolto helps him.   Gailen Shelter, MD [facility]   *This note was dictated using voice recognition software/Dragon.  Despite best efforts to proofread, errors can occur which can change the meaning.  Any change was purely unintentional.

## 2020-09-23 ENCOUNTER — Other Ambulatory Visit: Payer: Self-pay | Admitting: Family Medicine

## 2020-09-23 MED ORDER — LISINOPRIL 20 MG PO TABS: 20.0000 mg | ORAL_TABLET | Freq: Every day | ORAL | 0 refills | Status: DC

## 2020-09-23 NOTE — Telephone Encounter (Signed)
Copied from CRM [id number]. Topic: Quick Communication - Rx Refill/Question >> Sep 23, 2020  2:59 PM Mcneil, Ja-Kwan wrote: Medication: lisinopril (ZESTRIL) 20 MG tablet  Has the patient contacted their pharmacy? no  Preferred Pharmacy (with phone number or street name): [facility] [address] [place]), [place] - [address] Phone: [phone]  Fax: [phone]  Agent: Please be advised that RX refills may take up to 3 business days. We ask that you follow-up with your pharmacy.

## 2020-11-01 ENCOUNTER — Telehealth: Payer: Self-pay | Admitting: Family Medicine

## 2020-11-01 NOTE — Telephone Encounter (Signed)
Copied from CRM [id number]. Topic: Medicare AWV >> Nov 01, 2020  2:47 PM Claudette Laws R wrote: Reason for CRM:   No answer unable to leave message for patient to call back and schedule Medicare Annual Wellness Visit (AWV) to be done virtually.  No hx of AWV eligible as of 05/23/2014  Please schedule at anytime with [facility]-Nurse Health Advisor.      45 Minutes appointment   Any questions, please call me at [phone]

## 2020-11-25 ENCOUNTER — Ambulatory Visit (INDEPENDENT_AMBULATORY_CARE_PROVIDER_SITE_OTHER): Payer: Medicare Other

## 2020-11-25 VITALS — Ht 67.0 in | Wt 140.0 lb

## 2020-11-25 DIAGNOSIS — Z0000 Encounter for general adult medical examination without abnormal findings: Secondary | ICD-10-CM

## 2020-11-25 NOTE — Progress Notes (Signed)
I connected with Ralph Leon today by telephone and verified that I am speaking with the correct person using two identifiers. Location patient: home Location provider: work Persons participating in the virtual visit: Theoden Mauch, Elisha Ponder LPN.   I discussed the limitations, risks, security and privacy concerns of performing an evaluation and management service by telephone and the availability of in person appointments. I also discussed with the patient that there may be a patient responsible charge related to this service. The patient expressed understanding and verbally consented to this telephonic visit.    Interactive audio and video telecommunications were attempted between this provider and patient, however failed, due to patient having technical difficulties OR patient did not have access to video capability.  We continued and completed visit with audio only.     Vital signs may be patient reported or missing.  Subjective:   Ralph Leon is a 85 y.o. male who presents for an Initial Medicare Annual Wellness Visit.  Review of Systems     Cardiac Risk Factors include: advanced age (>65men, >51 women);hypertension;male gender;sedentary lifestyle;smoking/ tobacco exposure     Objective:    Today's Vitals   11/25/20 1427  Weight: 140 lb (63.5 kg)  Height: 5\' 7"  (1.702 m)   Body mass index is 21.93 kg/m.  Advanced Directives 11/25/2020  Does Patient Have a Medical Advance Directive? No    Current Medications (verified) Outpatient Encounter Medications as of 11/25/2020  Medication Sig  . lisinopril (ZESTRIL) 20 MG tablet Take 1 tablet (20 mg total) by mouth daily.  . rosuvastatin (CRESTOR) 10 MG tablet Take 1 tablet (10 mg total) by mouth daily.  . megestrol (MEGACE) 20 MG tablet Take 1 tablet (20 mg total) by mouth 2 (two) times daily as needed. (Patient not taking: Reported on 11/25/2020)  . triamcinolone cream (KENALOG) 0.1 % Apply 1 application topically 2 (two)  times daily. (Patient not taking: Reported on 11/25/2020)   No facility-administered encounter medications on file as of 11/25/2020.    Allergies (verified) Patient has no known allergies.   History: Past Medical History:  Diagnosis Date  . COPD (chronic obstructive pulmonary disease) (HCC)   . Hypertension   . Stroke [facility])    History reviewed. No pertinent surgical history. Family History  Problem Relation Age of Onset  . Diabetes Mother   . Heart attack Father    Social History   Socioeconomic History  . Marital status: Single    Spouse name: Not on file  . Number of children: Not on file  . Years of education: Not on file  . Highest education level: Not on file  Occupational History  . Occupation: retired  Tobacco Use  . Smoking status: Current Every Day Smoker    Packs/day: 0.25    Years: 64.00    Pack years: 16.00    Types: Cigarettes  . Smokeless tobacco: Never Used  . Tobacco comment: 0.5 pack daily-08/08/2020  Vaping Use  . Vaping Use: Never used  Substance and Sexual Activity  . Alcohol use: Yes    Alcohol/week: 5.0 standard drinks    Types: 5 Cans of beer per week  . Drug use: Not Currently  . Sexual activity: Not Currently  Other Topics Concern  . Not on file  Social History Narrative  . Not on file   Social Determinants of Health   Financial Resource Strain: Low Risk   . Difficulty of Paying Living Expenses: Not hard at all  Food Insecurity: No Food Insecurity  .  Worried About Charity fundraiser in the Last Year: Never true  . Ran Out of Food in the Last Year: Never true  Transportation Needs: No Transportation Needs  . Lack of Transportation (Medical): No  . Lack of Transportation (Non-Medical): No  Physical Activity: Inactive  . Days of Exercise per Week: 0 days  . Minutes of Exercise per Session: 0 min  Stress: No Stress Concern Present  . Feeling of Stress : Not at all  Social Connections: Not on file    Tobacco Counseling Ready to  quit: Yes Counseling given: Not Answered Comment: 0.5 pack daily-08/08/2020   Clinical Intake:  Pre-visit preparation completed: Yes  Pain : No/denies pain     Nutritional Status: BMI of 19-24  Normal Nutritional Risks: None Diabetes: No  How often do you need to have someone help you when you read instructions, pamphlets, or other written materials from your doctor or pharmacy?: 1 - Never What is the last grade level you completed in school?: 50yrs college  Diabetic? no  Interpreter Needed?: No  Information entered by :: NAllen LPN   Activities of Daily Living In your present state of health, do you have any difficulty performing the following activities: 11/25/2020 02/19/2020  Hearing? Y N  Comment a little -  Vision? Y N  Difficulty concentrating or making decisions? N N  Walking or climbing stairs? Y Y  Comment a little -  Dressing or bathing? N N  Doing errands, shopping? N N  Preparing Food and eating ? N -  Using the Toilet? N -  In the past six months, have you accidently leaked urine? N -  Do you have problems with loss of bowel control? N -  Managing your Medications? N -  Managing your Finances? N -  Housekeeping or managing your Housekeeping? N -  Some recent data might be hidden    Patient Care Team: Volney American, PA-C as PCP - General (Family Medicine) Kate Sable, MD as PCP - Cardiology (Cardiology)  Indicate any recent Medical Services you may have received from other than [facility] providers in the past year (date may be approximate).     Assessment:   This is a routine wellness examination for Ralph Leon.  Hearing/Vision screen  Hearing Screening   125Hz  250Hz  500Hz  1000Hz  2000Hz  3000Hz  4000Hz  6000Hz  8000Hz   Right ear:           Left ear:           Vision Screening Comments: No regular eye exams,   Dietary issues and exercise activities discussed: Current Exercise Habits: The patient does not participate in regular exercise at  present  Goals    . Patient Stated     11/25/2020, no goals      Depression Screen PHQ 2/9 Scores 11/25/2020 02/19/2020  PHQ - 2 Score 0 1  PHQ- 9 Score - 9    Fall Risk Fall Risk  11/25/2020 02/19/2020  Falls in the past year? 0 0  Number falls in past yr: - 0  Injury with Fall? - 0  Risk for fall due to : Impaired balance/gait;Medication side effect -  Follow up Falls evaluation completed;Education provided;Falls prevention discussed -    FALL RISK PREVENTION PERTAINING TO THE HOME:  Any stairs in or around the home? Yes  If so, are there any without handrails? No  Home free of loose throw rugs in walkways, pet beds, electrical cords, etc? Yes  Adequate lighting in your home to  reduce risk of falls? Yes   ASSISTIVE DEVICES UTILIZED TO PREVENT FALLS:  Life alert? No  Use of a cane, walker or w/c? No  Grab bars in the bathroom? Yes  Shower chair or bench in shower? No  Elevated toilet seat or a handicapped toilet? No   TIMED UP AND GO:  Was the test performed? No .  Cognitive Function:     6CIT Screen 11/25/2020  What Year? 0 points  What month? 0 points  What time? 0 points  Count back from 20 2 points  Months in reverse 0 points  Repeat phrase 4 points  Total Score 6    Immunizations  There is no immunization history on file for this patient.  TDAP status: Due, Education has been provided regarding the importance of this vaccine. Advised may receive this vaccine at local pharmacy or Health Dept. Aware to provide a copy of the vaccination record if obtained from local pharmacy or Health Dept. Verbalized acceptance and understanding.  Flu Vaccine status: Declined, Education has been provided regarding the importance of this vaccine but patient still declined. Advised may receive this vaccine at local pharmacy or Health Dept. Aware to provide a copy of the vaccination record if obtained from local pharmacy or Health Dept. Verbalized acceptance and  understanding.  Pneumococcal vaccine status: Declined,  Education has been provided regarding the importance of this vaccine but patient still declined. Advised may receive this vaccine at local pharmacy or Health Dept. Aware to provide a copy of the vaccination record if obtained from local pharmacy or Health Dept. Verbalized acceptance and understanding.   Covid-19 vaccine status: Completed vaccines  Qualifies for Shingles Vaccine? Yes   Zostavax completed No   Shingrix Completed?: No.    Education has been provided regarding the importance of this vaccine. Patient has been advised to call insurance company to determine out of pocket expense if they have not yet received this vaccine. Advised may also receive vaccine at local pharmacy or Health Dept. Verbalized acceptance and understanding.  Screening Tests Health Maintenance  Topic Date Due  . COVID-19 Vaccine (1) 12/11/2020 (Originally 02/16/1948)  . INFLUENZA VACCINE  02/20/2021 (Originally 06/23/2020)  . PNA vac Low Risk Adult (1 of 2 - PCV13) 07/03/2021 (Originally 02/17/2003)  . TETANUS/TDAP  11/25/2021 (Originally 02/16/1955)    Health Maintenance  There are no preventive care reminders to display for this patient.  Colorectal cancer screening: No longer required.   Lung Cancer Screening: (Low Dose CT Chest recommended if Age 28-80 years, 30 pack-year currently smoking OR have quit w/in 15years.) does not qualify.   Lung Cancer Screening Referral: no  Additional Screening:  Hepatitis C Screening: does not qualify;  Vision Screening: Recommended annual ophthalmology exams for early detection of glaucoma and other disorders of the eye. Is the patient up to date with their annual eye exam?  No  Who is the provider or what is the name of the office in which the patient attends annual eye exams? none If pt is not established with a provider, would they like to be referred to a provider to establish care? No .   Dental Screening:  Recommended annual dental exams for proper oral hygiene  Community Resource Referral / Chronic Care Management: CRR required this visit?  No   CCM required this visit?  No      Plan:     I have personally reviewed and noted the following in the patient's chart:   . Medical and social  history . Use of alcohol, tobacco or illicit drugs  . Current medications and supplements . Functional ability and status . Nutritional status . Physical activity . Advanced directives . List of other physicians . Hospitalizations, surgeries, and ER visits in previous 12 months . Vitals . Screenings to include cognitive, depression, and falls . Referrals and appointments  In addition, I have reviewed and discussed with patient certain preventive protocols, quality metrics, and best practice recommendations. A written personalized care plan for preventive services as well as general preventive health recommendations were provided to patient.     Barb Merino, LPN   01/29/7563   Nurse Notes:

## 2020-11-25 NOTE — Patient Instructions (Signed)
[name] , Thank you for taking time to come for your Medicare Wellness Visit. I appreciate your ongoing commitment to your health goals. Please review the following plan we discussed and let me know if I can assist you in the future.   Screening recommendations/referrals: Colonoscopy: not required Recommended yearly ophthalmology/optometry visit for glaucoma screening and checkup Recommended yearly dental visit for hygiene and checkup  Vaccinations: Influenza vaccine: decline Pneumococcal vaccine: decline Tdap vaccine: decline Shingles vaccine: decline   Covid-19:  decline  Advanced directives: Advance directive discussed with you today.    Conditions/risks identified: smoking  Next appointment: Follow up in one year for your annual wellness visit.   Preventive Care 83 Years and Older, Male Preventive care refers to lifestyle choices and visits with your health care provider that can promote health and wellness. What does preventive care include?  A yearly physical exam. This is also called an annual well check.  Dental exams once or twice a year.  Routine eye exams. Ask your health care provider how often you should have your eyes checked.  Personal lifestyle choices, including:  Daily care of your teeth and gums.  Regular physical activity.  Eating a healthy diet.  Avoiding tobacco and drug use.  Limiting alcohol use.  Practicing safe sex.  Taking low doses of aspirin every day.  Taking vitamin and mineral supplements as recommended by your health care provider. What happens during an annual well check? The services and screenings done by your health care provider during your annual well check will depend on your age, overall health, lifestyle risk factors, and family history of disease. Counseling  Your health care provider may ask you questions about your:  Alcohol use.  Tobacco use.  Drug use.  Emotional well-being.  Home and relationship  well-being.  Sexual activity.  Eating habits.  History of falls.  Memory and ability to understand (cognition).  Work and work Astronomer. Screening  You may have the following tests or measurements:  Height, weight, and BMI.  Blood pressure.  Lipid and cholesterol levels. These may be checked every 5 years, or more frequently if you are over 75 years old.  Skin check.  Lung cancer screening. You may have this screening every year starting at age 75 if you have a 30-pack-year history of smoking and currently smoke or have quit within the past 15 years.  Fecal occult blood test (FOBT) of the stool. You may have this test every year starting at age 14.  Flexible sigmoidoscopy or colonoscopy. You may have a sigmoidoscopy every 5 years or a colonoscopy every 10 years starting at age 61.  Prostate cancer screening. Recommendations will vary depending on your family history and other risks.  Hepatitis C blood test.  Hepatitis B blood test.  Sexually transmitted disease (STD) testing.  Diabetes screening. This is done by checking your blood sugar (glucose) after you have not eaten for a while (fasting). You may have this done every 1-3 years.  Abdominal aortic aneurysm (AAA) screening. You may need this if you are a current or former smoker.  Osteoporosis. You may be screened starting at age 35 if you are at high risk. Talk with your health care provider about your test results, treatment options, and if necessary, the need for more tests. Vaccines  Your health care provider may recommend certain vaccines, such as:  Influenza vaccine. This is recommended every year.  Tetanus, diphtheria, and acellular pertussis (Tdap, Td) vaccine. You may need a Td booster  every 10 years.  Zoster vaccine. You may need this after age 18.  Pneumococcal 13-valent conjugate (PCV13) vaccine. One dose is recommended after age 67.  Pneumococcal polysaccharide (PPSV23) vaccine. One dose is  recommended after age 15. Talk to your health care provider about which screenings and vaccines you need and how often you need them. This information is not intended to replace advice given to you by your health care provider. Make sure you discuss any questions you have with your health care provider. Document Released: 12/06/2015 Document Revised: 07/29/2016 Document Reviewed: 09/10/2015 Elsevier Interactive Patient Education  2017 [place] Prevention in the Home Falls can cause injuries. They can happen to people of all ages. There are many things you can do to make your home safe and to help prevent falls. What can I do on the outside of my home?  Regularly fix the edges of walkways and driveways and fix any cracks.  Remove anything that might make you trip as you walk through a door, such as a raised step or threshold.  Trim any bushes or trees on the path to your home.  Use bright outdoor lighting.  Clear any walking paths of anything that might make someone trip, such as rocks or tools.  Regularly check to see if handrails are loose or broken. Make sure that both sides of any steps have handrails.  Any raised decks and porches should have guardrails on the edges.  Have any leaves, snow, or ice cleared regularly.  Use sand or salt on walking paths during winter.  Clean up any spills in your garage right away. This includes oil or grease spills. What can I do in the bathroom?  Use night lights.  Install grab bars by the toilet and in the tub and shower. Do not use towel bars as grab bars.  Use non-skid mats or decals in the tub or shower.  If you need to sit down in the shower, use a plastic, non-slip stool.  Keep the floor dry. Clean up any water that spills on the floor as soon as it happens.  Remove soap buildup in the tub or shower regularly.  Attach bath mats securely with double-sided non-slip rug tape.  Do not have throw rugs and other things on  the floor that can make you trip. What can I do in the bedroom?  Use night lights.  Make sure that you have a light by your bed that is easy to reach.  Do not use any sheets or blankets that are too big for your bed. They should not hang down onto the floor.  Have a firm chair that has side arms. You can use this for support while you get dressed.  Do not have throw rugs and other things on the floor that can make you trip. What can I do in the kitchen?  Clean up any spills right away.  Avoid walking on wet floors.  Keep items that you use a lot in easy-to-reach places.  If you need to reach something above you, use a strong step stool that has a grab bar.  Keep electrical cords out of the way.  Do not use floor polish or wax that makes floors slippery. If you must use wax, use non-skid floor wax.  Do not have throw rugs and other things on the floor that can make you trip. What can I do with my stairs?  Do not leave any items on the stairs.  Make  sure that there are handrails on both sides of the stairs and use them. Fix handrails that are broken or loose. Make sure that handrails are as long as the stairways.  Check any carpeting to make sure that it is firmly attached to the stairs. Fix any carpet that is loose or worn.  Avoid having throw rugs at the top or bottom of the stairs. If you do have throw rugs, attach them to the floor with carpet tape.  Make sure that you have a light switch at the top of the stairs and the bottom of the stairs. If you do not have them, ask someone to add them for you. What else can I do to help prevent falls?  Wear shoes that:  Do not have high heels.  Have rubber bottoms.  Are comfortable and fit you well.  Are closed at the toe. Do not wear sandals.  If you use a stepladder:  Make sure that it is fully opened. Do not climb a closed stepladder.  Make sure that both sides of the stepladder are locked into place.  Ask someone to  hold it for you, if possible.  Clearly mark and make sure that you can see:  Any grab bars or handrails.  First and last steps.  Where the edge of each step is.  Use tools that help you move around (mobility aids) if they are needed. These include:  Canes.  Walkers.  Scooters.  Crutches.  Turn on the lights when you go into a dark area. Replace any light bulbs as soon as they burn out.  Set up your furniture so you have a clear path. Avoid moving your furniture around.  If any of your floors are uneven, fix them.  If there are any pets around you, be aware of where they are.  Review your medicines with your doctor. Some medicines can make you feel dizzy. This can increase your chance of falling. Ask your doctor what other things that you can do to help prevent falls. This information is not intended to replace advice given to you by your health care provider. Make sure you discuss any questions you have with your health care provider. Document Released: 09/05/2009 Document Revised: 04/16/2016 Document Reviewed: 12/14/2014 Elsevier Interactive Patient Education  2017 [employer].

## 2020-12-05 ENCOUNTER — Ambulatory Visit (INDEPENDENT_AMBULATORY_CARE_PROVIDER_SITE_OTHER): Payer: Medicare Other | Admitting: Pulmonary Disease

## 2020-12-05 ENCOUNTER — Other Ambulatory Visit: Payer: Self-pay

## 2020-12-05 ENCOUNTER — Encounter: Payer: Self-pay | Admitting: Pulmonary Disease

## 2020-12-05 VITALS — BP 126/70 | HR 93 | Temp 97.3°F | Ht 67.0 in | Wt 140.8 lb

## 2020-12-05 DIAGNOSIS — I340 Nonrheumatic mitral (valve) insufficiency: Secondary | ICD-10-CM

## 2020-12-05 DIAGNOSIS — J449 Chronic obstructive pulmonary disease, unspecified: Secondary | ICD-10-CM

## 2020-12-05 DIAGNOSIS — F17210 Nicotine dependence, cigarettes, uncomplicated: Secondary | ICD-10-CM | POA: Diagnosis not present

## 2020-12-05 DIAGNOSIS — R0602 Shortness of breath: Secondary | ICD-10-CM

## 2020-12-05 DIAGNOSIS — J4489 Other specified chronic obstructive pulmonary disease: Secondary | ICD-10-CM

## 2020-12-05 MED ORDER — ANORO ELLIPTA 62.5-25 MCG/INH IN AEPB
1.0000 | INHALATION_SPRAY | Freq: Every day | RESPIRATORY_TRACT | 0 refills | Status: AC
Start: 2020-12-05 — End: 2020-12-06

## 2020-12-05 NOTE — Patient Instructions (Signed)
We are going to try a different inhaler called Anoro this is a little bit easier to use.  1 inhalation daily, let us know how you are doing with this inhaler so that we can send the prescription in for you  We are going to recheck your oxygen level at nighttime please let us know when this is done   Will see in follow-up in 3 months time call sooner should any new problems arise

## 2020-12-05 NOTE — Progress Notes (Signed)
Subjective:    Patient ID: Ralph Leon, male    DOB: Mar 25, 1936, 85 y.o.   MRN: [id number]  HPI This is an 85 year old, current smoker (half PPD) who presents for on shortness of breath.  He actually minimizes the symptoms today.  He has noted that inhalers like Anoro do not help him.  He felt Ralph Leon helped him the most however, felt that the price of the medication was prohibitive.  He is currently not using any inhalers.  He was supposed to have oximetry which we have not received the results of.  Attempt to track him was unsuccessful.  He has significant mitral regurgitation and has had some nocturnal symptoms to include "dreaming a lot" and for him to get up in the mornings.  He has not had any orthopnea or paroxysmal nocturnal dyspnea.  Lower extremity edema.  Does not note any wheezing.  Cough is usually in the mornings productive of whitish sputum no hemoptysis.  No fevers, chills or sweats.  Voices no other complaint.   Review of Systems A 10 point review of systems was performed and it is as noted above otherwise negative.  Patient Active Problem List   Diagnosis Date Noted  . IFG (impaired fasting glucose) 07/08/2020  . Essential hypertension 03/03/2020  . History of CVA (cerebrovascular accident) 03/03/2020  . COPD (chronic obstructive pulmonary disease) (HCC)    Social History   Tobacco Use  . Smoking status: Current Every Day Smoker    Packs/day: 1.00    Years: 64.00    Pack years: 64.00    Types: Cigarettes  . Smokeless tobacco: Never Used  . Tobacco comment: 0.5 pack daily-12/05/2020  Substance Use Topics  . Alcohol use: Yes    Alcohol/week: 5.0 standard drinks    Types: 5 Cans of beer per week   Current Meds  Medication Sig  . rosuvastatin (CRESTOR) 10 MG tablet Take 1 tablet (10 mg total) by mouth daily.  . [EXPIRED] umeclidinium-vilanterol (ANORO ELLIPTA) 62.5-25 MCG/INH AEPB Inhale 1 puff into the lungs daily for 1 day.  . [DISCONTINUED] lisinopril  (ZESTRIL) 20 MG tablet Take 1 tablet (20 mg total) by mouth daily.    There is no immunization history on file for this patient.  We discussed Covid-19 precautions.  I reviewed the vaccine effectiveness and potential side effects in detail to include differences between mRNA vaccines and traditional vaccines (attenuated virus).  Discussion also offered of long-term effectiveness and safety profile which are unclear at this time.  Discussed current CDC guidance that all patients are recommended COVID-19 vaccinations [when available]-as long as they do not have allergy to components of the vaccine.    Objective:   Physical Exam BP 126/70 (BP Location: Left Arm, Cuff Size: Normal)   Pulse 93   Temp (!) 97.3 F (36.3 C) (Temporal)   Ht 5\' 7"  (1.702 m)   Wt 140 lb 12.8 oz (63.9 kg)   SpO2 100%   BMI 22.05 kg/m   GENERAL:Thin elderly male, somewhat disheveled, no acute distress. Fully ambulatory HEAD: Normocephalic, atraumatic.  EYES: Pupils equal, round, reactive to light. No scleral icterus.  MOUTH:Nose/mouth/throat not examined due to masking requirements for COVID 19. NECK: Supple. No thyromegaly. Trachea midline. No JVD. No adenopathy. PULMONARY:Distant breath sounds, coarse, no other adventitious sounds. CARDIOVASCULAR: S1 and S2. Regular rate and rhythm.Grade 2/6 systolic ejection murmur left sternal border with radiation to axilla. GASTROINTESTINAL:Nondistended. MUSCULOSKELETAL: No joint deformity, no clubbing, no edema.  NEUROLOGIC:Very hard of hearing, awake, alert,  no focal deficit noted. No gait disturbance noted. Speech is fluent. SKIN: Intact,warm,dry.On limited exam no rashes.  PSYCH:Mood and behavior appropriate.   Ambulatory oximetry: Patient maintained 97% saturations on room air.    Assessment & Plan:     ICD-10-CM   1. SOB (shortness of breath)  R06.02    Multifactorial COPD, MR, deconditioning Check overnight oximetry Ongoing tobacco use,  counseled to discontinue  2. COPD with chronic bronchitis and emphysema (HCC)  J44.9 Pulse oximetry, overnight    CANCELED: AMB REFERRAL FOR DME   Resume Anoro, this is covered by his plan Stop smoking follow-up in 3 months Check overnight oximetry  3. Tobacco dependence due to cigarettes  F17.210    Patient counseled regards discontinuation of smoking  Total counseling time 3 to 5 minutes  4. Moderate mitral regurgitation by prior echocardiogram  I34.0    This issue adds complexity to his management Consider reevaluation May be adding to his dyspnea issues    Meds ordered this encounter  Medications  . umeclidinium-vilanterol (ANORO ELLIPTA) 62.5-25 MCG/INH AEPB    Sig: Inhale 1 puff into the lungs daily for 1 day.    Dispense:  7 each    Refill:  0    Order Specific Question:   Lot Number?    Answer:   [id number]    Order Specific Question:   Expiration Date?    Answer:   09/23/2021    Order Specific Question:   Manufacturer?    Answer:   [employer] [[id number]]    Order Specific Question:   Quantity    Answer:   2   Orders Placed This Encounter  Procedures  . Pulse oximetry, overnight    On roomair QZE:[id number].    Standing Status:   Future    Standing Expiration Date:   12/05/2021   Discussion:  Patient was supposed to have an overnight oximetry, this cannot be tracked apparently was done but results not sent to the correct office.  We will need to repeat.  It is likely that he is having episodes of nocturnal oxygen desaturation.  We will resume Anoro initially he stated that this did not help but when pressed he is not as sure of this.  This is covered by his insurance.  We will see him in follow-up in 3 months time he is to contact us prior to that time should any new difficulties arise.  Ralph Shelter, MD [facility]   *This note was dictated using voice recognition software/Dragon.  Despite best efforts to proofread, errors can occur which can change the meaning.  Any  change was purely unintentional.

## 2020-12-06 ENCOUNTER — Other Ambulatory Visit: Payer: Self-pay | Admitting: Nurse Practitioner

## 2020-12-12 ENCOUNTER — Telehealth: Payer: Self-pay | Admitting: Pulmonary Disease

## 2020-12-12 NOTE — Telephone Encounter (Signed)
Our office has not received ONO results yet.  I spoke to [place] with [facility], who stated that patient had ONO last night. ONO device is scheduled to be picked up from patient's home today.   Spoke to patient and made him aware that our office will contact him with results once we have received them from [place].  He voiced his understanding and had no further question.  Nothing further needed at this time.

## 2020-12-12 NOTE — Telephone Encounter (Signed)
Delray Alt can you follow-up on this, Dr. Jayme Cloud patient who had an ONO. You may have results in [place]. Thanks

## 2020-12-17 ENCOUNTER — Telehealth: Payer: Self-pay | Admitting: Pulmonary Disease

## 2020-12-17 NOTE — Telephone Encounter (Signed)
ONO reviewed by Dr. Jonne Ply need for nighttime oxygen.  Lm for patient.

## 2020-12-17 NOTE — Telephone Encounter (Signed)
Patient is aware of results and voiced his understanding.  Nothing further needed.  

## 2021-01-03 ENCOUNTER — Ambulatory Visit: Payer: Medicare Other | Admitting: Nurse Practitioner

## 2021-01-20 ENCOUNTER — Encounter: Payer: Self-pay | Admitting: Nurse Practitioner

## 2021-01-20 ENCOUNTER — Other Ambulatory Visit: Payer: Self-pay

## 2021-01-20 ENCOUNTER — Ambulatory Visit (INDEPENDENT_AMBULATORY_CARE_PROVIDER_SITE_OTHER): Payer: Medicare Other | Admitting: Nurse Practitioner

## 2021-01-20 VITALS — BP 126/76 | HR 85 | Temp 98.0°F | Wt 140.0 lb

## 2021-01-20 DIAGNOSIS — I2510 Atherosclerotic heart disease of native coronary artery without angina pectoris: Secondary | ICD-10-CM

## 2021-01-20 DIAGNOSIS — R7301 Impaired fasting glucose: Secondary | ICD-10-CM | POA: Diagnosis not present

## 2021-01-20 DIAGNOSIS — J449 Chronic obstructive pulmonary disease, unspecified: Secondary | ICD-10-CM | POA: Diagnosis not present

## 2021-01-20 DIAGNOSIS — E7800 Pure hypercholesterolemia, unspecified: Secondary | ICD-10-CM | POA: Insufficient documentation

## 2021-01-20 DIAGNOSIS — I10 Essential (primary) hypertension: Secondary | ICD-10-CM

## 2021-01-20 MED ORDER — LISINOPRIL 20 MG PO TABS: 20.0000 mg | ORAL_TABLET | Freq: Every day | ORAL | 1 refills | Status: DC

## 2021-01-20 MED ORDER — ROSUVASTATIN CALCIUM 10 MG PO TABS: 10.0000 mg | ORAL_TABLET | Freq: Every day | ORAL | 1 refills | Status: DC

## 2021-01-20 NOTE — Assessment & Plan Note (Signed)
Stable.  Continue on Crestor daily.  Refill sent today.  Labs checked today.

## 2021-01-20 NOTE — Progress Notes (Signed)
BP 126/76   Pulse 85   Temp 98 F (36.7 C)   Wt 140 lb (63.5 kg)   SpO2 98%   BMI 21.93 kg/m    Subjective:    Patient ID: Ralph Leon, male    DOB: 1936-01-06, 85 y.o.   MRN: [id number]  HPI: Ralph Leon is a 85 y.o. male  Chief Complaint  Patient presents with  . Hypertension   HYPERTENSION / HYPERLIPIDEMIA Satisfied with current treatment? yes Duration of hypertension: years BP monitoring frequency: daily BP range: 140/70s BP medication side effects: no Past BP meds: lisinopril Duration of hyperlipidemia: years Cholesterol medication side effects: no Cholesterol supplements: none Past cholesterol medications: rosuvastatin (crestor) Medication compliance: good compliance Patient has been out of medication so he stopped taking it. Aspirin: no Recent stressors: no Recurrent headaches: no Visual changes: no Palpitations: no Dyspnea: no Chest pain: no Lower extremity edema: no Dizzy/lightheaded: no   COPD COPD status: uncontrolled Satisfied with current treatment?: patient not currently on any inhaler for COPD.  Followed by Pulmonology clinic. Oxygen use: no Dyspnea frequency: Feels short of breath when he walks Cough frequency:  Rescue inhaler frequency:  Doesn't have one Limitation of activity: yes Productive cough:  Last Spirometry:  Pneumovax: states he received at [facility] clinic Influenza: unknown  Review of Systems  Eyes: Negative for visual disturbance.  Respiratory: Positive for shortness of breath.   Cardiovascular: Negative for chest pain and leg swelling.  Neurological: Negative for light-headedness and headaches.    Per HPI unless specifically indicated above     Objective:    BP 126/76   Pulse 85   Temp 98 F (36.7 C)   Wt 140 lb (63.5 kg)   SpO2 98%   BMI 21.93 kg/m   Wt Readings from Last 3 Encounters:  01/20/21 140 lb (63.5 kg)  12/05/20 140 lb 12.8 oz (63.9 kg)  11/25/20 140 lb (63.5 kg)    Physical Exam Vitals  and nursing note reviewed.  Constitutional:      General: He is not in acute distress.    Appearance: Normal appearance. He is not ill-appearing, toxic-appearing or diaphoretic.  HENT:     Head: Normocephalic.     Right Ear: External ear normal.     Left Ear: External ear normal.     Nose: Nose normal. No congestion or rhinorrhea.     Mouth/Throat:     Mouth: Mucous membranes are moist.  Eyes:     General:        Right eye: No discharge.        Left eye: No discharge.     Extraocular Movements: Extraocular movements intact.     Conjunctiva/sclera: Conjunctivae normal.     Pupils: Pupils are equal, round, and reactive to light.  Cardiovascular:     Rate and Rhythm: Normal rate and regular rhythm.     Heart sounds: No murmur heard.   Pulmonary:     Effort: Pulmonary effort is normal. No respiratory distress.     Breath sounds: Decreased breath sounds present. No wheezing, rhonchi or rales.  Abdominal:     General: Abdomen is flat. Bowel sounds are normal.  Musculoskeletal:     Cervical back: Normal range of motion and neck supple.  Skin:    General: Skin is warm and dry.     Capillary Refill: Capillary refill takes less than 2 seconds.  Neurological:     General: No focal deficit present.     Mental Status:  He is alert and oriented to person, place, and time.  Psychiatric:        Mood and Affect: Mood normal.        Behavior: Behavior normal.        Thought Content: Thought content normal.        Judgment: Judgment normal.     Results for orders placed or performed in visit on 07/03/20  CBC with Differential/Platelet  Result Value Ref Range   WBC 8.2 3.4 - 10.8 x10E3/uL   RBC 3.85 (L) 4.14 - 5.80 x10E6/uL   Hemoglobin 11.6 (L) 13.0 - 17.7 g/dL   Hematocrit 09.8 (L) 11.9 - 51.0 %   MCV 89 79 - 97 fL   MCH 30.1 26.6 - 33.0 pg   MCHC 33.7 31.5 - 35.7 g/dL   RDW 14.7 82.9 - 56.2 %   Platelets 263 150 - 450 x10E3/uL   Neutrophils 65 Not Estab. %   Lymphs 23 Not  Estab. %   Monocytes 8 Not Estab. %   Eos 3 Not Estab. %   Basos 1 Not Estab. %   Neutrophils Absolute 5.3 1.4 - 7.0 x10E3/uL   Lymphocytes Absolute 1.9 0.7 - 3.1 x10E3/uL   Monocytes Absolute 0.7 0.1 - 0.9 x10E3/uL   EOS (ABSOLUTE) 0.3 0.0 - 0.4 x10E3/uL   Basophils Absolute 0.0 0.0 - 0.2 x10E3/uL   Immature Granulocytes 0 Not Estab. %   Immature Grans (Abs) 0.0 0.0 - 0.1 x10E3/uL  Comprehensive metabolic panel  Result Value Ref Range   Glucose 116 (H) 65 - 99 mg/dL   BUN 26 8 - 27 mg/dL   Creatinine, Ser 1.30 0.76 - 1.27 mg/dL   GFR calc non Af Amer 60 >59 mL/min/1.73   GFR calc Af Amer 70 >59 mL/min/1.73   BUN/Creatinine Ratio 23 10 - 24   Sodium 139 134 - 144 mmol/L   Potassium 4.5 3.5 - 5.2 mmol/L   Chloride 105 96 - 106 mmol/L   CO2 20 20 - 29 mmol/L   Calcium 9.3 8.6 - 10.2 mg/dL   Total Protein 6.6 6.0 - 8.5 g/dL   Albumin 4.3 3.6 - 4.6 g/dL   Globulin, Total 2.3 1.5 - 4.5 g/dL   Albumin/Globulin Ratio 1.9 1.2 - 2.2   Bilirubin Total 0.4 0.0 - 1.2 mg/dL   Alkaline Phosphatase 65 48 - 121 IU/L   AST 9 0 - 40 IU/L   ALT 6 0 - 44 IU/L  TSH  Result Value Ref Range   TSH 1.690 0.450 - 4.500 uIU/mL  Lipid Panel w/o Chol/HDL Ratio  Result Value Ref Range   Cholesterol, Total 238 (H) 100 - 199 mg/dL   Triglycerides 865 0 - 149 mg/dL   HDL 49 >78 mg/dL   VLDL Cholesterol Cal 23 5 - 40 mg/dL   LDL Chol Calc (NIH) 469 (H) 0 - 99 mg/dL  HgB G2X  Result Value Ref Range   Hgb A1c MFr Bld 5.9 (H) 4.8 - 5.6 %   Est. average glucose Bld gHb Est-mCnc 123 mg/dL      Assessment & Plan:   Problem List Items Addressed This Visit      Cardiovascular and Mediastinum   Essential hypertension - Primary    Stable and well controlled, continue present medications.      Relevant Medications   lisinopril (ZESTRIL) 20 MG tablet   rosuvastatin (CRESTOR) 10 MG tablet   Other Relevant Orders   Comprehensive metabolic panel   Coronary artery  disease    Stable.  Patient on Crestor  10mg .  Noted on CT chest in 2021.  Will continue to monitor.      Relevant Medications   lisinopril (ZESTRIL) 20 MG tablet   rosuvastatin (CRESTOR) 10 MG tablet   Other Relevant Orders   Lipid Profile     Respiratory   COPD (chronic obstructive pulmonary disease) (HCC)    Following with Pulmonology, still quite symptomatic. Patient has tried several inhalers which he states do not help him symptoms.  Patient will see Pulmonologist again in April.      Relevant Orders   Lipid Profile     Endocrine   IFG (impaired fasting glucose)    Stable.  Will recheck a1c and adjust medications as needed.      Relevant Orders   HgB A1c     Other   Hypercholesteremia    Stable.  Continue on Crestor daily.  Refill sent today.  Labs checked today.       Relevant Medications   lisinopril (ZESTRIL) 20 MG tablet   rosuvastatin (CRESTOR) 10 MG tablet   Other Relevant Orders   Lipid Profile       Follow up plan: Return in about 3 months (around 04/19/2021) for HTN, HLD, DM2 FU.

## 2021-01-20 NOTE — Assessment & Plan Note (Signed)
Following with Pulmonology, still quite symptomatic. Patient has tried several inhalers which he states do not help him symptoms.  Patient will see Pulmonologist again in April.

## 2021-01-20 NOTE — Assessment & Plan Note (Signed)
Stable and well controlled, continue present medications 

## 2021-01-20 NOTE — Assessment & Plan Note (Signed)
Stable.  Will recheck a1c and adjust medications as needed.

## 2021-01-20 NOTE — Assessment & Plan Note (Signed)
Stable.  Patient on Crestor 10mg .  Noted on CT chest in 2021.  Will continue to monitor.

## 2021-01-21 LAB — COMPREHENSIVE METABOLIC PANEL
ALT: 10 IU/L (ref 0–44)
AST: 15 IU/L (ref 0–40)
Albumin/Globulin Ratio: 1.6 (ref 1.2–2.2)
Albumin: 4.2 g/dL (ref 3.6–4.6)
Alkaline Phosphatase: 77 IU/L (ref 44–121)
BUN/Creatinine Ratio: 17 (ref 10–24)
BUN: 18 mg/dL (ref 8–27)
Bilirubin Total: 0.4 mg/dL (ref 0.0–1.2)
CO2: 23 mmol/L (ref 20–29)
Calcium: 9.5 mg/dL (ref 8.6–10.2)
Chloride: 103 mmol/L (ref 96–106)
Creatinine, Ser: 1.09 mg/dL (ref 0.76–1.27)
Globulin, Total: 2.6 g/dL (ref 1.5–4.5)
Glucose: 104 mg/dL — ABNORMAL HIGH (ref 65–99)
Potassium: 4.6 mmol/L (ref 3.5–5.2)
Sodium: 143 mmol/L (ref 134–144)
Total Protein: 6.8 g/dL (ref 6.0–8.5)
eGFR: 68 mL/min/{1.73_m2} (ref >59)

## 2021-01-21 LAB — LIPID PANEL
Chol/HDL Ratio: 3 ratio (ref 0.0–5.0)
Cholesterol, Total: 202 mg/dL — ABNORMAL HIGH (ref 100–199)
HDL: 68 mg/dL (ref >39)
LDL Chol Calc (NIH): 112 mg/dL — ABNORMAL HIGH (ref 0–99)
Triglycerides: 126 mg/dL (ref 0–149)
VLDL Cholesterol Cal: 22 mg/dL (ref 5–40)

## 2021-01-21 LAB — HEMOGLOBIN A1C
Est. average glucose Bld gHb Est-mCnc: 131 mg/dL
Hgb A1c MFr Bld: 6.2 % — ABNORMAL HIGH (ref 4.8–5.6)

## 2021-01-21 NOTE — Progress Notes (Signed)
Please let patient know that his lab work is stable.  A1c did increase from 5.9 to 6.2.  Recommend patient follow a low carb diet to help maintain this.  Cholesterol has improved from prior.  Keep up the good work.  Liver, Kidney's and electrolytes look good.  I will see you at our next visit.

## 2021-02-12 DIAGNOSIS — I700 Atherosclerosis of aorta: Secondary | ICD-10-CM | POA: Insufficient documentation

## 2021-03-06 ENCOUNTER — Other Ambulatory Visit: Payer: Self-pay

## 2021-03-06 ENCOUNTER — Encounter: Payer: Self-pay | Admitting: Pulmonary Disease

## 2021-03-06 ENCOUNTER — Ambulatory Visit (INDEPENDENT_AMBULATORY_CARE_PROVIDER_SITE_OTHER): Payer: Medicare Other | Admitting: Pulmonary Disease

## 2021-03-06 VITALS — BP 138/82 | HR 75 | Temp 98.0°F | Ht 62.99 in | Wt 136.4 lb

## 2021-03-06 DIAGNOSIS — F17210 Nicotine dependence, cigarettes, uncomplicated: Secondary | ICD-10-CM

## 2021-03-06 DIAGNOSIS — J449 Chronic obstructive pulmonary disease, unspecified: Secondary | ICD-10-CM

## 2021-03-06 DIAGNOSIS — R0602 Shortness of breath: Secondary | ICD-10-CM | POA: Diagnosis not present

## 2021-03-06 DIAGNOSIS — I340 Nonrheumatic mitral (valve) insufficiency: Secondary | ICD-10-CM | POA: Diagnosis not present

## 2021-03-06 NOTE — Patient Instructions (Signed)
We will see you as needed.  Let us know if you develop any issues with your breathing and we will be happy to work you in again.

## 2021-03-06 NOTE — Progress Notes (Signed)
Subjective:    Patient ID: Ralph Leon, male    DOB: 11/28/1935, 85 y.o.   MRN: [id number]  HPI Patient is an 85 year old current smoker (64 pack years) who presents for follow-up on the issue of shortness of breath.  Patient today states that he has not had any difficulties with shortness of breath.  He is able to perform his activities of daily living including chopping wood for his heating needs at home.  Previously he was tried on Anoro which was not effective and Breztri which was effective however the patient was reluctant to get due to cost.  He is currently on no inhalers and states that he does not think he needs them.  He has not had any fevers, chills or sweats.  No cough.  No chest pain.  No orthopnea or paroxysmal nocturnal dyspnea.  No lower extremity edema.   Review of Systems A 10 point review of systems was performed and it is as noted above otherwise negative.  Patient Active Problem List   Diagnosis Date Noted  . Aortic atherosclerosis (HCC) 02/12/2021  . Hypercholesteremia 01/20/2021  . Coronary artery disease 01/20/2021  . IFG (impaired fasting glucose) 07/08/2020  . Essential hypertension 03/03/2020  . History of CVA (cerebrovascular accident) 03/03/2020  . COPD (chronic obstructive pulmonary disease) (HCC)    Social History   Tobacco Use  . Smoking status: Current Every Day Smoker    Packs/day: 1.00    Years: 64.00    Pack years: 64.00    Types: Cigarettes  . Smokeless tobacco: Never Used  . Tobacco comment: 5-6 a day. 03/06/2021  Substance Use Topics  . Alcohol use: Yes    Alcohol/week: 5.0 standard drinks    Types: 5 Cans of beer per week   No Known Allergies  Current Meds  Medication Sig  . lisinopril (ZESTRIL) 20 MG tablet Take 1 tablet (20 mg total) by mouth daily.  . rosuvastatin (CRESTOR) 10 MG tablet Take 1 tablet (10 mg total) by mouth daily.    There is no immunization history on file for this patient.  Patient declined pneumonia  vaccine.  We discussed Covid-19 precautions.  I reviewed the vaccine effectiveness and potential side effects in detail to include differences between mRNA vaccines and traditional vaccines (attenuated virus).  Discussion also offered of long-term effectiveness and safety profile which are unclear at this time.  Discussed current CDC guidance that all patients are recommended COVID-19 vaccinations -as long as they do not have allergy to components of the vaccine.    Objective:   Physical Exam BP 138/82 (BP Location: Left Arm, Patient Position: Sitting, Cuff Size: Normal)   Pulse 75   Temp 98 F (36.7 C) (Temporal)   Ht 5' 2.99" (1.6 m)   Wt 136 lb 6.4 oz (61.9 kg)   SpO2 98%   BMI 24.17 kg/m  GENERAL:Thin elderly male, somewhat disheveled, no acute distress. Fully ambulatory HEAD: Normocephalic, atraumatic.  EYES: Pupils equal, round, reactive to light. No scleral icterus.  MOUTH:Nose/mouth/throat not examined due to masking requirements for COVID 19. NECK: Supple. No thyromegaly. Trachea midline. No JVD. No adenopathy. PULMONARY:Distant breath sounds, coarse, no other adventitious sounds. CARDIOVASCULAR: S1 and S2. Regular rate and rhythm.Grade 2/6 systolic ejection murmur left sternal border with radiation to axilla. GASTROINTESTINAL:Nondistended. MUSCULOSKELETAL: No joint deformity, no clubbing, no edema.  NEUROLOGIC:Very hard of hearing, awake, alert, no focal deficit noted. No gait disturbance noted. Speech is fluent. SKIN: Intact,warm,dry.On limited exam no rashes.  PSYCH:Mood and  behavior appropriate.  Overnight oximetry of 11 December 2020 did not show any need for oxygen nocturnally.     Assessment & Plan:     ICD-10-CM   1. SOB (shortness of breath)  R06.02    Per patient this has resolved.  2. COPD with chronic bronchitis and emphysema (HCC)  J44.9    Patient asymptomatic in this regard Declines to use inhalers  3. Moderate mitral regurgitation by  prior echocardiogram  I34.0    Noted on most recent 2D echo Asymptomatic  4. Tobacco dependence due to cigarettes  F17.210    Patient was counseled regards discontinuation of smoking Total counseling time 3 to 5 minutes   We will see the patient in follow-up on an as-needed basis.   Gailen Shelter, MD [facility]   *This note was dictated using voice recognition software/Dragon.  Despite best efforts to proofread, errors can occur which can change the meaning.  Any change was purely unintentional.

## 2021-04-02 ENCOUNTER — Encounter: Payer: Self-pay | Admitting: Nurse Practitioner

## 2021-04-04 ENCOUNTER — Other Ambulatory Visit: Payer: Self-pay

## 2021-04-04 ENCOUNTER — Ambulatory Visit (INDEPENDENT_AMBULATORY_CARE_PROVIDER_SITE_OTHER): Payer: Medicare Other | Admitting: Nurse Practitioner

## 2021-04-04 ENCOUNTER — Encounter: Payer: Self-pay | Admitting: Nurse Practitioner

## 2021-04-04 ENCOUNTER — Ambulatory Visit: Payer: Self-pay | Admitting: Nurse Practitioner

## 2021-04-04 ENCOUNTER — Ambulatory Visit: Payer: Self-pay

## 2021-04-04 VITALS — BP 108/60 | HR 99 | Temp 97.9°F | Wt 136.6 lb

## 2021-04-04 DIAGNOSIS — R7301 Impaired fasting glucose: Secondary | ICD-10-CM | POA: Diagnosis not present

## 2021-04-04 DIAGNOSIS — I2510 Atherosclerotic heart disease of native coronary artery without angina pectoris: Secondary | ICD-10-CM

## 2021-04-04 DIAGNOSIS — D649 Anemia, unspecified: Secondary | ICD-10-CM

## 2021-04-04 DIAGNOSIS — W19XXXA Unspecified fall, initial encounter: Secondary | ICD-10-CM | POA: Diagnosis not present

## 2021-04-04 DIAGNOSIS — I10 Essential (primary) hypertension: Secondary | ICD-10-CM | POA: Diagnosis not present

## 2021-04-04 LAB — BAYER DCA HB A1C WAIVED: HB A1C (BAYER DCA - WAIVED): 6.1 % (ref <7.0)

## 2021-04-04 NOTE — Telephone Encounter (Signed)
Pt. Fell yesterday at home. "Legs gave out." [facility] on his buttocks. No injury. No bruising. Has weakness walking today. Using a walker. Requests appointment today. Appointment made.  Reason for Disposition . [1] No prior tetanus shots (or is not fully vaccinated) AND [2] any wound (e.g., cut or scrape)  Answer Assessment - Initial Assessment Questions 1. MECHANISM: "How did the fall happen?"     Fell at home.Leg gave out. 2. DOMESTIC VIOLENCE AND ELDER ABUSE SCREENING: "Did you fall because someone pushed you or tried to hurt you?" If Yes, ask: "Are you safe now?"     No 3. ONSET: "When did the fall happen?" (e.g., minutes, hours, or days ago)     Yesterday 4. LOCATION: "What part of the body hit the ground?" (e.g., back, buttocks, head, hips, knees, hands, head, stomach)     Fell on buttocks 5. INJURY: "Did you hurt (injure) yourself when you fell?" If Yes, ask: "What did you injure? Tell me more about this?" (e.g., body area; type of injury; pain severity)"     Walking slow 6. PAIN: "Is there any pain?" If Yes, ask: "How bad is the pain?" (e.g., Scale 1-10; or mild,  moderate, severe)   - NONE (0): No pain   - MILD (1-3): Doesn't interfere with normal activities    - MODERATE (4-7): Interferes with normal activities or awakens from sleep    - SEVERE (8-10): Excruciating pain, unable to do any normal activities      No 7. SIZE: For cuts, bruises, or swelling, ask: "How large is it?" (e.g., inches or centimeters)      No 8. PREGNANCY: "Is there any chance you are pregnant?" "When was your last menstrual period?"     n/a 9. OTHER SYMPTOMS: "Do you have any other symptoms?" (e.g., dizziness, fever, weakness; new onset or worsening).      No 10. CAUSE: "What do you think caused the fall (or falling)?" (e.g., tripped, dizzy spell)       Legs gave out  Protocols used: FALLS AND FALLING-A-AH

## 2021-04-04 NOTE — Assessment & Plan Note (Signed)
2 falls yesterday due to weakness. Will decrease his lisinopril to see if low blood pressure is causing his dizziness and falls. He lives alone, however has family nearby. He carries his cell phone on him at all times. Offered him PT at home, however he declined. Will follow-up in 2 weeks to re-check blood pressure and re-assess his symptoms. Will offer PT again at this visit.

## 2021-04-04 NOTE — Patient Instructions (Addendum)
It was great to see you!  Take 1/2 tablet of your lisinopril (the blood pressure pill). Drink plenty of fluids. Can drink boost or ensure to help supplement your nutrition.  Let's follow-up in 2 weeks, sooner if you have concerns.  Take care, Rodman Pickle, NP   Fall Prevention in the Home, Adult Falls can cause injuries and can happen to people of all ages. There are many things you can do to make your home safe and to help prevent falls. Ask for help when making these changes. What actions can I take to prevent falls? General Instructions  Use good lighting in all rooms. Replace any light bulbs that burn out.  Turn on the lights in dark areas. Use night-lights.  Keep items that you use often in easy-to-reach places. Lower the shelves around your home if needed.  Set up your furniture so you have a clear path. Avoid moving your furniture around.  Do not have throw rugs or other things on the floor that can make you trip.  Avoid walking on wet floors.  If any of your floors are uneven, fix them.  Add color or contrast paint or tape to clearly mark and help you see: ? Grab bars or handrails. ? First and last steps of staircases. ? Where the edge of each step is.  If you use a stepladder: ? Make sure that it is fully opened. Do not climb a closed stepladder. ? Make sure the sides of the stepladder are locked in place. ? Ask someone to hold the stepladder while you use it.  Know where your pets are when moving through your home. What can I do in the bathroom?  Keep the floor dry. Clean up any water on the floor right away.  Remove soap buildup in the tub or shower.  Use nonskid mats or decals on the floor of the tub or shower.  Attach bath mats securely with double-sided, nonslip rug tape.  If you need to sit down in the shower, use a plastic, nonslip stool.  Install grab bars by the toilet and in the tub and shower. Do not use towel bars as grab bars.      What  can I do in the bedroom?  Make sure that you have a light by your bed that is easy to reach.  Do not use any sheets or blankets for your bed that hang to the floor.  Have a firm chair with side arms that you can use for support when you get dressed. What can I do in the kitchen?  Clean up any spills right away.  If you need to reach something above you, use a step stool with a grab bar.  Keep electrical cords out of the way.  Do not use floor polish or wax that makes floors slippery. What can I do with my stairs?  Do not leave any items on the stairs.  Make sure that you have a light switch at the top and the bottom of the stairs.  Make sure that there are handrails on both sides of the stairs. Fix handrails that are broken or loose.  Install nonslip stair treads on all your stairs.  Avoid having throw rugs at the top or bottom of the stairs.  Choose a carpet that does not hide the edge of the steps on the stairs.  Check carpeting to make sure that it is firmly attached to the stairs. Fix carpet that is loose or worn. What  can I do on the outside of my home?  Use bright outdoor lighting.  Fix the edges of walkways and driveways and fix any cracks.  Remove anything that might make you trip as you walk through a door, such as a raised step or threshold.  Trim any bushes or trees on paths to your home.  Check to see if handrails are loose or broken and that both sides of all steps have handrails.  Install guardrails along the edges of any raised decks and porches.  Clear paths of anything that can make you trip, such as tools or rocks.  Have leaves, snow, or ice cleared regularly.  Use sand or salt on paths during winter.  Clean up any spills in your garage right away. This includes grease or oil spills. What other actions can I take?  Wear shoes that: ? Have a low heel. Do not wear high heels. ? Have rubber bottoms. ? Feel good on your feet and fit well. ? Are  closed at the toe. Do not wear open-toe sandals.  Use tools that help you move around if needed. These include: ? Canes. ? Walkers. ? Scooters. ? Crutches.  Review your medicines with your doctor. Some medicines can make you feel dizzy. This can increase your chance of falling. Ask your doctor what else you can do to help prevent falls. Where to find more information  Centers for Disease Control and Prevention, STEADI: FootballExhibition.com.br  [employer] on Aging: https://walker.com/ Contact a doctor if:  You are afraid of falling at home.  You feel weak, drowsy, or dizzy at home.  You fall at home. Summary  There are many simple things that you can do to make your home safe and to help prevent falls.  Ways to make your home safe include removing things that can make you trip and installing grab bars in the bathroom.  Ask for help when making these changes in your home. This information is not intended to replace advice given to you by your health care provider. Make sure you discuss any questions you have with your health care provider. Document Revised: 06/12/2020 Document Reviewed: 06/12/2020 Elsevier Patient Education  2021 [employer].

## 2021-04-04 NOTE — Assessment & Plan Note (Signed)
Will check A1C today. Controlled with diet.

## 2021-04-04 NOTE — Assessment & Plan Note (Signed)
BP today 108/60 manually. Will decrease lisinopril to 10mg  daily. Encouraged him to drink more fluids and make sure he eats.Change positions slowly. Will check bmp, cbc today.

## 2021-04-04 NOTE — Progress Notes (Signed)
Acute Office Visit  Subjective:    Patient ID: Ralph Leon, male    DOB: [birth year], 85 y.o.   MRN: [id number]  Chief Complaint  Patient presents with  . Fall    Patient states he had 2 falls yesterday. States he just lost his balance and fell. No injuries per patient.     HPI Patient is in today for two falls yesterday. He lives alone, always carries his cell phone on him and has some family that lives nearby. His godson accompanied him to this appointment.   FALL  When did it happen: Yesterday Circumstances: getting paper, getting coffee--both times his legs got weak and he fell.  Dizziness: yes Light-headed: yes Loss of consciousness: no Hit head: no Pain: no Where is the pain: n/a Last fall: "not in a long time"  HYPERTENSION  Hypertension status: controlled  Satisfied with current treatment? yes Duration of hypertension: chronic BP monitoring frequency:  daily BP range: 130s-140s/80s BP medication side effects: recent dizziness and weakness Medication compliance: excellent compliance Previous BP meds:lisinopril Aspirin: no Recurrent headaches: no Visual changes: no Palpitations: no Dyspnea: no Chest pain: no Lower extremity edema: no Dizzy/lightheaded: yes   Past Medical History:  Diagnosis Date  . COPD (chronic obstructive pulmonary disease) ([place])   . Hypertension   . Stroke [facility])     History reviewed. No pertinent surgical history.  Family History  Problem Relation Age of Onset  . Diabetes Mother   . Heart attack Father     Social History   Socioeconomic History  . Marital status: Single    Spouse name: Not on file  . Number of children: Not on file  . Years of education: Not on file  . Highest education level: Not on file  Occupational History  . Occupation: retired  Tobacco Use  . Smoking status: Current Every Day Smoker    Packs/day: 1.00    Years: 64.00    Pack years: 64.00    Types: Cigarettes  . Smokeless tobacco: Never  Used  . Tobacco comment: 5-6 a day. 03/06/2021  Vaping Use  . Vaping Use: Never used  Substance and Sexual Activity  . Alcohol use: Yes    Alcohol/week: 5.0 standard drinks    Types: 5 Cans of beer per week  . Drug use: Not Currently  . Sexual activity: Not Currently  Other Topics Concern  . Not on file  Social History Narrative  . Not on file   Social Determinants of Health   Financial Resource Strain: Low Risk   . Difficulty of Paying Living Expenses: Not hard at all  Food Insecurity: No Food Insecurity  . Worried About Charity fundraiser in the Last Year: Never true  . Ran Out of Food in the Last Year: Never true  Transportation Needs: No Transportation Needs  . Lack of Transportation (Medical): No  . Lack of Transportation (Non-Medical): No  Physical Activity: Inactive  . Days of Exercise per Week: 0 days  . Minutes of Exercise per Session: 0 min  Stress: No Stress Concern Present  . Feeling of Stress : Not at all  Social Connections: Not on file  Intimate Partner Violence: Not on file    Outpatient Medications Prior to Visit  Medication Sig Dispense Refill  . lisinopril (ZESTRIL) 20 MG tablet Take 1 tablet (20 mg total) by mouth daily. 90 tablet 1  . rosuvastatin (CRESTOR) 10 MG tablet Take 1 tablet (10 mg total) by mouth daily. 90 tablet  1   No facility-administered medications prior to visit.    No Known Allergies  Review of Systems  Constitutional: Positive for fatigue (chronic).  HENT: Negative.   Respiratory: Negative.   Cardiovascular: Negative.   Gastrointestinal: Negative.   Genitourinary: Negative.   Musculoskeletal: Negative.   Neurological: Positive for dizziness and weakness.       Objective:    Physical Exam Vitals and nursing note reviewed.  Constitutional:      Appearance: Normal appearance.  HENT:     Head: Normocephalic.  Eyes:     Conjunctiva/sclera: Conjunctivae normal.     Pupils: Pupils are equal, round, and reactive to  light.  Cardiovascular:     Rate and Rhythm: Normal rate and regular rhythm.     Pulses: Normal pulses.     Heart sounds: Normal heart sounds.  Pulmonary:     Effort: Pulmonary effort is normal.     Breath sounds: Normal breath sounds.  Abdominal:     Palpations: Abdomen is soft.  Musculoskeletal:        General: Normal range of motion.     Cervical back: Normal range of motion.  Skin:    General: Skin is warm.  Neurological:     General: No focal deficit present.     Mental Status: He is alert and oriented to person, place, and time.     Cranial Nerves: No cranial nerve deficit.     Motor: Weakness (strength 3/5 in bilateral upper and lower extremities) present.  Psychiatric:        Mood and Affect: Mood normal.        Behavior: Behavior normal.        Thought Content: Thought content normal.        Judgment: Judgment normal.     BP 108/60 (BP Location: Right Arm, Patient Position: Sitting)   Pulse 99   Temp 97.9 F (36.6 C) (Oral)   Wt 136 lb 9.6 oz (62 kg)   SpO2 98%   BMI 24.20 kg/m  Wt Readings from Last 3 Encounters:  04/04/21 136 lb 9.6 oz (62 kg)  03/06/21 136 lb 6.4 oz (61.9 kg)  01/20/21 140 lb (63.5 kg)    There are no preventive care reminders to display for this patient.  There are no preventive care reminders to display for this patient.   Lab Results  Component Value Date   TSH 1.690 07/03/2020   Lab Results  Component Value Date   WBC 8.2 07/03/2020   HGB 11.6 (L) 07/03/2020   HCT 34.4 (L) 07/03/2020   MCV 89 07/03/2020   PLT 263 07/03/2020   Lab Results  Component Value Date   NA 143 01/20/2021   K 4.6 01/20/2021   CO2 23 01/20/2021   GLUCOSE 104 (H) 01/20/2021   BUN 18 01/20/2021   CREATININE 1.09 01/20/2021   BILITOT 0.4 01/20/2021   ALKPHOS 77 01/20/2021   AST 15 01/20/2021   ALT 10 01/20/2021   PROT 6.8 01/20/2021   ALBUMIN 4.2 01/20/2021   CALCIUM 9.5 01/20/2021   EGFR 68 01/20/2021   Lab Results  Component Value Date    CHOL 202 (H) 01/20/2021   Lab Results  Component Value Date   HDL 68 01/20/2021   Lab Results  Component Value Date   LDLCALC 112 (H) 01/20/2021   Lab Results  Component Value Date   TRIG 126 01/20/2021   Lab Results  Component Value Date   CHOLHDL 3.0 01/20/2021  Lab Results  Component Value Date   HGBA1C 6.2 (H) 01/20/2021       Assessment & Plan:   Problem List Items Addressed This Visit      Cardiovascular and Mediastinum   Essential hypertension    BP today 108/60 manually. Will decrease lisinopril to 72m daily. Encouraged him to drink more fluids and make sure he eats.Change positions slowly. Will check bmp, cbc today.       Relevant Orders   Basic Metabolic Panel (BMET)   CBC with Differential     Endocrine   IFG (impaired fasting glucose)    Will check A1C today. Controlled with diet.      Relevant Orders   Basic Metabolic Panel (BMET)   CBC with Differential   Bayer DCA Hb A1c Waived     Other   Fall - Primary    2 falls yesterday due to weakness. Will decrease his lisinopril to see if low blood pressure is causing his dizziness and falls. He lives alone, however has family nearby. He carries his cell phone on him at all times. Offered him PT at home, however he declined. Will follow-up in 2 weeks to re-check blood pressure and re-assess his symptoms. Will offer PT again at this visit.        Other Visit Diagnoses    Anemia, unspecified type       Will repeat CBC and check iron panel. Treat based on results   Relevant Orders   Iron, TIBC and Ferritin Panel       No orders of the defined types were placed in this encounter.    LCharyl Dancer NP

## 2021-04-05 LAB — BASIC METABOLIC PANEL
BUN/Creatinine Ratio: 18 (ref 10–24)
BUN: 20 mg/dL (ref 8–27)
CO2: 22 mmol/L (ref 20–29)
Calcium: 9.6 mg/dL (ref 8.6–10.2)
Chloride: 104 mmol/L (ref 96–106)
Creatinine, Ser: 1.12 mg/dL (ref 0.76–1.27)
Glucose: 108 mg/dL — ABNORMAL HIGH (ref 65–99)
Potassium: 4.1 mmol/L (ref 3.5–5.2)
Sodium: 146 mmol/L — ABNORMAL HIGH (ref 134–144)
eGFR: 65 mL/min/{1.73_m2} (ref >59)

## 2021-04-05 LAB — CBC WITH DIFFERENTIAL/PLATELET
Basophils Absolute: 0 10*3/uL (ref 0.0–0.2)
Basos: 0 %
EOS (ABSOLUTE): 0.2 10*3/uL (ref 0.0–0.4)
Eos: 3 %
Hematocrit: 42.5 % (ref 37.5–51.0)
Hemoglobin: 13.6 g/dL (ref 13.0–17.7)
Immature Grans (Abs): 0 10*3/uL (ref 0.0–0.1)
Immature Granulocytes: 0 %
Lymphocytes Absolute: 1.7 10*3/uL (ref 0.7–3.1)
Lymphs: 24 %
MCH: 28.4 pg (ref 26.6–33.0)
MCHC: 32 g/dL (ref 31.5–35.7)
MCV: 89 fL (ref 79–97)
Monocytes Absolute: 0.6 10*3/uL (ref 0.1–0.9)
Monocytes: 8 %
Neutrophils Absolute: 4.6 10*3/uL (ref 1.4–7.0)
Neutrophils: 65 %
Platelets: 201 10*3/uL (ref 150–450)
RBC: 4.79 x10E6/uL (ref 4.14–5.80)
RDW: 14.2 % (ref 11.6–15.4)
WBC: 7 10*3/uL (ref 3.4–10.8)

## 2021-04-05 LAB — IRON,TIBC AND FERRITIN PANEL
Ferritin: 112 ng/mL (ref 30–400)
Iron Saturation: 26 % (ref 15–55)
Iron: 68 ug/dL (ref 38–169)
Total Iron Binding Capacity: 264 ug/dL (ref 250–450)
UIBC: 196 ug/dL (ref 111–343)

## 2021-04-18 ENCOUNTER — Other Ambulatory Visit: Payer: Self-pay

## 2021-04-18 ENCOUNTER — Ambulatory Visit (INDEPENDENT_AMBULATORY_CARE_PROVIDER_SITE_OTHER): Payer: Medicare Other | Admitting: Nurse Practitioner

## 2021-04-18 ENCOUNTER — Encounter: Payer: Self-pay | Admitting: Nurse Practitioner

## 2021-04-18 VITALS — BP 97/60 | HR 88 | Temp 98.7°F | Wt 138.4 lb

## 2021-04-18 DIAGNOSIS — I2510 Atherosclerotic heart disease of native coronary artery without angina pectoris: Secondary | ICD-10-CM

## 2021-04-18 DIAGNOSIS — I10 Essential (primary) hypertension: Secondary | ICD-10-CM

## 2021-04-18 NOTE — Progress Notes (Signed)
BP 97/60   Pulse 88   Temp 98.7 F (37.1 C) (Oral)   Wt 138 lb 6.4 oz (62.8 kg)   SpO2 95%   BMI 24.52 kg/m    Subjective:    Patient ID: Ralph Leon, male    DOB: Jan 07, 1936, 85 y.o.   MRN: [id number]  HPI: Ralph Leon is a 85 y.o. male  Chief Complaint  Patient presents with  . Hypertension    2 week f/up   FALL Patient states he hasn't had anymore falls. Patient states he is still feeling weak and lightheaded at times. Does not drink a lot of water.   HYPERTENSION Recurrent headaches: no Visual changes: no Palpitations: no Dyspnea: no Chest pain: no Lower extremity edema: no Dizzy/lightheaded: yes  Relevant past medical, surgical, family and social history reviewed and updated as indicated. Interim medical history since our last visit reviewed. Allergies and medications reviewed and updated.  Review of Systems  Eyes: Negative for visual disturbance.  Respiratory: Negative for shortness of breath.   Cardiovascular: Negative for chest pain and leg swelling.  Neurological: Positive for light-headedness. Negative for headaches.    Per HPI unless specifically indicated above     Objective:    BP 97/60   Pulse 88   Temp 98.7 F (37.1 C) (Oral)   Wt 138 lb 6.4 oz (62.8 kg)   SpO2 95%   BMI 24.52 kg/m   Wt Readings from Last 3 Encounters:  04/18/21 138 lb 6.4 oz (62.8 kg)  04/04/21 136 lb 9.6 oz (62 kg)  03/06/21 136 lb 6.4 oz (61.9 kg)    Physical Exam Vitals and nursing note reviewed.  Constitutional:      General: He is not in acute distress.    Appearance: Normal appearance. He is not ill-appearing, toxic-appearing or diaphoretic.  HENT:     Head: Normocephalic.     Right Ear: External ear normal.     Left Ear: External ear normal.     Nose: Nose normal. No congestion or rhinorrhea.     Mouth/Throat:     Mouth: Mucous membranes are moist.  Eyes:     General:        Right eye: No discharge.        Left eye: No discharge.      Extraocular Movements: Extraocular movements intact.     Conjunctiva/sclera: Conjunctivae normal.     Pupils: Pupils are equal, round, and reactive to light.  Cardiovascular:     Rate and Rhythm: Normal rate and regular rhythm.     Heart sounds: No murmur heard.   Pulmonary:     Effort: Pulmonary effort is normal. No respiratory distress.     Breath sounds: Normal breath sounds. No wheezing, rhonchi or rales.  Abdominal:     General: Abdomen is flat. Bowel sounds are normal.  Musculoskeletal:     Cervical back: Normal range of motion and neck supple.     Comments: Uses walker  Skin:    General: Skin is warm and dry.     Capillary Refill: Capillary refill takes less than 2 seconds.  Neurological:     General: No focal deficit present.     Mental Status: He is alert and oriented to person, place, and time.  Psychiatric:        Mood and Affect: Mood normal.        Behavior: Behavior normal.        Thought Content: Thought content normal.  Judgment: Judgment normal.     Results for orders placed or performed in visit on 01/77/93  Basic Metabolic Panel (BMET)  Result Value Ref Range   Glucose 108 (H) 65 - 99 mg/dL   BUN 20 8 - 27 mg/dL   Creatinine, Ser 1.12 0.76 - 1.27 mg/dL   eGFR 65 >59 mL/min/1.73   BUN/Creatinine Ratio 18 10 - 24   Sodium 146 (H) 134 - 144 mmol/L   Potassium 4.1 3.5 - 5.2 mmol/L   Chloride 104 96 - 106 mmol/L   CO2 22 20 - 29 mmol/L   Calcium 9.6 8.6 - 10.2 mg/dL  CBC with Differential  Result Value Ref Range   WBC 7.0 3.4 - 10.8 x10E3/uL   RBC 4.79 4.14 - 5.80 x10E6/uL   Hemoglobin 13.6 13.0 - 17.7 g/dL   Hematocrit 42.5 37.5 - 51.0 %   MCV 89 79 - 97 fL   MCH 28.4 26.6 - 33.0 pg   MCHC 32.0 31.5 - 35.7 g/dL   RDW 14.2 11.6 - 15.4 %   Platelets 201 150 - 450 x10E3/uL   Neutrophils 65 Not Estab. %   Lymphs 24 Not Estab. %   Monocytes 8 Not Estab. %   Eos 3 Not Estab. %   Basos 0 Not Estab. %   Neutrophils Absolute 4.6 1.4 - 7.0  x10E3/uL   Lymphocytes Absolute 1.7 0.7 - 3.1 x10E3/uL   Monocytes Absolute 0.6 0.1 - 0.9 x10E3/uL   EOS (ABSOLUTE) 0.2 0.0 - 0.4 x10E3/uL   Basophils Absolute 0.0 0.0 - 0.2 x10E3/uL   Immature Granulocytes 0 Not Estab. %   Immature Grans (Abs) 0.0 0.0 - 0.1 x10E3/uL  Iron, TIBC and Ferritin Panel  Result Value Ref Range   Total Iron Binding Capacity 264 250 - 450 ug/dL   UIBC 196 111 - 343 ug/dL   Iron 68 38 - 169 ug/dL   Iron Saturation 26 15 - 55 %   Ferritin 112 30 - 400 ng/mL  Bayer DCA Hb A1c Waived  Result Value Ref Range   HB A1C (BAYER DCA - WAIVED) 6.1 <7.0 %      Assessment & Plan:   Problem List Items Addressed This Visit      Cardiovascular and Mediastinum   Essential hypertension - Primary    Will D/C Lisinopril due to patient's recent history of falls and continued low blood pressures.  Recommend patient increase the amount of water that he is drinking daily. Can consider restarting medication if blood pressure increases. Follow up in 3 weeks for blood pressure check.          Follow up plan: Return in about 3 weeks (around 05/09/2021) for BP Check.    A total of 30 minutes were spent on this encounter today.  When total time is documented, this includes both the face-to-face and non-face-to-face time personally spent before, during and after the visit on the date of the encounter.

## 2021-04-18 NOTE — Assessment & Plan Note (Signed)
Will D/C Lisinopril due to patient's recent history of falls and continued low blood pressures.  Recommend patient increase the amount of water that he is drinking daily. Can consider restarting medication if blood pressure increases. Follow up in 3 weeks for blood pressure check.

## 2021-04-21 ENCOUNTER — Ambulatory Visit: Payer: Medicare Other | Admitting: Nurse Practitioner

## 2021-04-25 ENCOUNTER — Other Ambulatory Visit: Payer: Self-pay

## 2021-04-25 ENCOUNTER — Ambulatory Visit (INDEPENDENT_AMBULATORY_CARE_PROVIDER_SITE_OTHER): Payer: Medicare Other

## 2021-04-25 DIAGNOSIS — R0600 Dyspnea, unspecified: Secondary | ICD-10-CM

## 2021-04-25 DIAGNOSIS — I340 Nonrheumatic mitral (valve) insufficiency: Secondary | ICD-10-CM

## 2021-04-25 DIAGNOSIS — I10 Essential (primary) hypertension: Secondary | ICD-10-CM | POA: Diagnosis not present

## 2021-04-25 DIAGNOSIS — R0609 Other forms of dyspnea: Secondary | ICD-10-CM

## 2021-04-28 ENCOUNTER — Telehealth: Payer: Self-pay | Admitting: *Deleted

## 2021-04-28 NOTE — Telephone Encounter (Signed)
Pt brought in form to be filled out by Larae Grooms for [facility] services. Placed in BIN for review

## 2021-04-29 NOTE — Telephone Encounter (Signed)
Form started. Placed in providers folder for completion and signature.

## 2021-04-30 NOTE — Telephone Encounter (Signed)
Placed in your folder.

## 2021-04-30 NOTE — Telephone Encounter (Signed)
Form signed.  There are parts the patient will need to complete.

## 2021-05-01 NOTE — Telephone Encounter (Signed)
Patient notified

## 2021-05-05 LAB — ECHOCARDIOGRAM COMPLETE
AR max vel: 3.77 cm2
AV Area VTI: 3.51 cm2
AV Area mean vel: 3.3 cm2
AV Mean grad: 2 mmHg
AV Peak grad: 3.7 mmHg
Ao pk vel: 0.96 m/s
Area-P 1/2: 3.85 cm2
S' Lateral: 3 cm
Single Plane A4C EF: 40.8 %

## 2021-05-09 ENCOUNTER — Other Ambulatory Visit: Payer: Self-pay

## 2021-05-09 ENCOUNTER — Ambulatory Visit (INDEPENDENT_AMBULATORY_CARE_PROVIDER_SITE_OTHER): Payer: Medicare Other | Admitting: Nurse Practitioner

## 2021-05-09 ENCOUNTER — Encounter: Payer: Self-pay | Admitting: Nurse Practitioner

## 2021-05-09 VITALS — BP 136/74 | HR 93 | Temp 98.6°F | Wt 138.0 lb

## 2021-05-09 DIAGNOSIS — I2510 Atherosclerotic heart disease of native coronary artery without angina pectoris: Secondary | ICD-10-CM

## 2021-05-09 DIAGNOSIS — I10 Essential (primary) hypertension: Secondary | ICD-10-CM | POA: Diagnosis not present

## 2021-05-09 NOTE — Progress Notes (Signed)
BP 136/74   Pulse 93   Temp 98.6 F (37 C)   Wt 138 lb (62.6 kg)   SpO2 96%   BMI 24.45 kg/m    Subjective:    Patient ID: Ralph Leon, male    DOB: [birth year], 85 y.o.   MRN: [id number]  HPI: Ralph Leon is a 85 y.o. male  Chief Complaint  Patient presents with   Hypertension    HYPERTENSION Hypertension status: controlled  Satisfied with current treatment? yes Duration of hypertension: years BP monitoring frequency:  daily BP range: 130/80 BP medication side effects:   no medications Medication compliance: excellent compliance Previous BP meds:lisinopril Aspirin: no Recurrent headaches: no Visual changes: no Palpitations: no Dyspnea: no Chest pain: no Lower extremity edema: no Dizzy/lightheaded: yes  Relevant past medical, surgical, family and social history reviewed and updated as indicated. Interim medical history since our last visit reviewed. Allergies and medications reviewed and updated.  Review of Systems  Eyes:  Negative for visual disturbance.  Respiratory:  Negative for shortness of breath.   Cardiovascular:  Negative for chest pain and leg swelling.  Neurological:  Positive for dizziness and light-headedness. Negative for headaches.   Per HPI unless specifically indicated above     Objective:    BP 136/74   Pulse 93   Temp 98.6 F (37 C)   Wt 138 lb (62.6 kg)   SpO2 96%   BMI 24.45 kg/m   Wt Readings from Last 3 Encounters:  05/09/21 138 lb (62.6 kg)  04/18/21 138 lb 6.4 oz (62.8 kg)  04/04/21 136 lb 9.6 oz (62 kg)    Physical Exam Vitals and nursing note reviewed.  Constitutional:      General: He is not in acute distress.    Appearance: Normal appearance. He is not ill-appearing, toxic-appearing or diaphoretic.  HENT:     Head: Normocephalic.     Right Ear: External ear normal.     Left Ear: External ear normal.     Nose: Nose normal. No congestion or rhinorrhea.     Mouth/Throat:     Mouth: Mucous membranes are  moist.  Eyes:     General:        Right eye: No discharge.        Left eye: No discharge.     Extraocular Movements: Extraocular movements intact.     Conjunctiva/sclera: Conjunctivae normal.     Pupils: Pupils are equal, round, and reactive to light.  Cardiovascular:     Rate and Rhythm: Normal rate and regular rhythm.     Heart sounds: No murmur heard. Pulmonary:     Effort: Pulmonary effort is normal. No respiratory distress.     Breath sounds: Normal breath sounds. No wheezing, rhonchi or rales.  Abdominal:     General: Abdomen is flat. Bowel sounds are normal.  Musculoskeletal:     Cervical back: Normal range of motion and neck supple.  Skin:    General: Skin is warm and dry.     Capillary Refill: Capillary refill takes less than 2 seconds.  Neurological:     General: No focal deficit present.     Mental Status: He is alert and oriented to person, place, and time.  Psychiatric:        Mood and Affect: Mood normal.        Behavior: Behavior normal.        Thought Content: Thought content normal.        Judgment: Judgment normal.  Results for orders placed or performed in visit on 04/25/21  ECHOCARDIOGRAM COMPLETE  Result Value Ref Range   AR max vel 3.77 cm2   AV Peak grad 3.7 mmHg   Ao pk vel 0.96 m/s   S' Lateral 3.00 cm   Area-P 1/2 3.85 cm2   AV Area VTI 3.51 cm2   AV Mean grad 2.0 mmHg   Single Plane A4C EF 40.8 %   AV Area mean vel 3.30 cm2      Assessment & Plan:   Problem List Items Addressed This Visit       Cardiovascular and Mediastinum   Essential hypertension - Primary    Chronic.  Controlled without medication.  Encouraged patient to drink plenty of water to help with dizziness. Follow up in 2 months to ensure blood pressure is still stable.         Follow up plan: Return in about 2 months (around 07/09/2021) for BP Check.

## 2021-05-09 NOTE — Assessment & Plan Note (Signed)
Chronic.  Controlled without medication.  Encouraged patient to drink plenty of water to help with dizziness. Follow up in 2 months to ensure blood pressure is still stable.

## 2021-06-02 ENCOUNTER — Ambulatory Visit (INDEPENDENT_AMBULATORY_CARE_PROVIDER_SITE_OTHER): Payer: Medicare Other | Admitting: Cardiology

## 2021-06-02 ENCOUNTER — Encounter: Payer: Self-pay | Admitting: Cardiology

## 2021-06-02 ENCOUNTER — Other Ambulatory Visit: Payer: Self-pay

## 2021-06-02 VITALS — BP 132/62 | HR 80 | Ht 64.0 in | Wt 136.0 lb

## 2021-06-02 DIAGNOSIS — I2510 Atherosclerotic heart disease of native coronary artery without angina pectoris: Secondary | ICD-10-CM | POA: Diagnosis not present

## 2021-06-02 DIAGNOSIS — I340 Nonrheumatic mitral (valve) insufficiency: Secondary | ICD-10-CM

## 2021-06-02 DIAGNOSIS — F17200 Nicotine dependence, unspecified, uncomplicated: Secondary | ICD-10-CM

## 2021-06-02 MED ORDER — ASPIRIN EC 81 MG PO TBEC: 81.0000 mg | DELAYED_RELEASE_TABLET | Freq: Every day | ORAL | 3 refills | Status: AC

## 2021-06-02 NOTE — Patient Instructions (Addendum)
Medication Instructions:   START taking Aspirin 81 MG once a day.  *If you need a refill on your cardiac medications before your next appointment, please call your pharmacy*   Lab Work: None ordered If you have labs (blood work) drawn today and your tests are completely normal, you will receive your results only by: MyChart Message (if you have MyChart) OR A paper copy in the mail If you have any lab test that is abnormal or we need to change your treatment, we will call you to review the results.   Testing/Procedures: None ordered   Follow-Up: At [facility], you and your health needs are our priority.  As part of our continuing mission to provide you with exceptional heart care, we have created designated Provider Care Teams.  These Care Teams include your primary Cardiologist (physician) and Advanced Practice Providers (APPs -  Physician Assistants and Nurse Practitioners) who all work together to provide you with the care you need, when you need it.  We recommend signing up for the patient portal called "MyChart".  Sign up information is provided on this After Visit Summary.  MyChart is used to connect with patients for Virtual Visits (Telemedicine).  Patients are able to view lab/test results, encounter notes, upcoming appointments, etc.  Non-urgent messages can be sent to your provider as well.   To learn more about what you can do with MyChart, go to [email or web address].    Your next appointment:   1 year(s)  The format for your next appointment:   In Person  Provider:   Debbe Odea, MD   Other Instructions

## 2021-06-02 NOTE — Progress Notes (Signed)
Cardiology Office Note:    Date:  06/02/2021   ID:  Ralph Leon, DOB 1936-07-10, MRN [id number]  PCP:  Larae Grooms, NP  Cardiologist:  Debbe Odea, MD  Electrophysiologist:  None   Referring MD: Larae Grooms, NP   Chief Complaint  Patient presents with   Other    12 month follow up. Patient c.o some SOB. Meds reviewed verbally with patient.     History of Present Illness:    Ralph Leon is a 85 y.o. male with a hx of hyperlipidemia, mild to moderate MR, stroke, COPD, current smoker x50+ years who presents for follow-up.    Patient being seen for mitral regurgitation.  Previously seen for shortness of breath, work-up with echo and Lexiscan Myoview was unrevealing.  Diagnosed with COPD, current smoker.  Symptoms of shortness of breath attributed to pulmonary etiology.  Had a repeat echocardiogram 1 year from prior to evaluate mitral regurgitation.  Presents for results.  Still has occasional shortness of breath.  Chest CT 02/2020 showed 80 coronary calcifications.  Previously was on lisinopril for blood pressure, but his blood pressures were running low, PCP stopped lisinopril.  Prior notes Echo 04/2021 EF 60 to 65%, mild to moderate MR Echo 04/2020, EF 55 to 60%, mild to moderate MR Lexiscan Myoview 02/2020, no evidence for ischemia, low risk scan    Past Medical History:  Diagnosis Date   COPD (chronic obstructive pulmonary disease) (HCC)    Hypertension    Stroke [facility] [place])     History reviewed. No pertinent surgical history.  Current Medications: Current Meds  Medication Sig   aspirin EC 81 MG tablet Take 1 tablet (81 mg total) by mouth daily. Swallow whole.   rosuvastatin (CRESTOR) 10 MG tablet Take 1 tablet (10 mg total) by mouth daily.     Allergies:   Patient has no known allergies.   Social History   Socioeconomic History   Marital status: Single    Spouse name: Not on file   Number of children: Not on file   Years of education: Not on file    Highest education level: Not on file  Occupational History   Occupation: retired  Tobacco Use   Smoking status: Every Day    Packs/day: 0.50    Years: 64.00    Pack years: 32.00    Types: Cigarettes   Smokeless tobacco: Never   Tobacco comments:    5-6 a day. 03/06/2021  Vaping Use   Vaping Use: Never used  Substance and Sexual Activity   Alcohol use: Not Currently   Drug use: Not Currently   Sexual activity: Not Currently  Other Topics Concern   Not on file  Social History Narrative   Not on file   Social Determinants of Health   Financial Resource Strain: Low Risk    Difficulty of Paying Living Expenses: Not hard at all  Food Insecurity: No Food Insecurity   Worried About Programme researcher, broadcasting/film/video in the Last Year: Never true   Ran Out of Food in the Last Year: Never true  Transportation Needs: No Transportation Needs   Lack of Transportation (Medical): No   Lack of Transportation (Non-Medical): No  Physical Activity: Inactive   Days of Exercise per Week: 0 days   Minutes of Exercise per Session: 0 min  Stress: No Stress Concern Present   Feeling of Stress : Not at all  Social Connections: Not on file     Family History: The patient's family history includes Diabetes  in his mother; Heart attack in his father.  ROS:   Please see the history of present illness.     All other systems reviewed and are negative.  EKGs/Labs/Other Studies Reviewed:    The following studies were reviewed today:   EKG:  EKG is  ordered today.  The ekg ordered today demonstrates normal sinus rhythm, normal ECG.  Recent Labs: 07/03/2020: TSH 1.690 01/20/2021: ALT 10 04/04/2021: BUN 20; Creatinine, Ser 1.12; Hemoglobin 13.6; Platelets 201; Potassium 4.1; Sodium 146  Recent Lipid Panel    Component Value Date/Time   CHOL 202 (H) 01/20/2021 1410   TRIG 126 01/20/2021 1410   HDL 68 01/20/2021 1410   CHOLHDL 3.0 01/20/2021 1410   LDLCALC 112 (H) 01/20/2021 1410    Physical Exam:     VS:  BP 132/62 (BP Location: Left Arm, Patient Position: Sitting, Cuff Size: Normal)   Pulse 80   Ht 5\' 4"  (1.626 m)   Wt 136 lb (61.7 kg)   SpO2 96%   BMI 23.34 kg/m     Wt Readings from Last 3 Encounters:  06/02/21 136 lb (61.7 kg)  05/09/21 138 lb (62.6 kg)  04/18/21 138 lb 6.4 oz (62.8 kg)     GEN:  Well nourished, soft-spoken, appears weak. HEENT: Normal NECK: No JVD; No carotid bruits LYMPHATICS: No lymphadenopathy CARDIAC: RRR, no murmurs, rubs, gallops RESPIRATORY: Decreased breath sounds but otherwise clear ABDOMEN: Soft, non-tender, non-distended MUSCULOSKELETAL:  No edema; No deformity  SKIN: Warm and dry NEUROLOGIC:  Alert and oriented x 3 PSYCHIATRIC:  Normal affect   ASSESSMENT:    1. Coronary artery disease involving native coronary artery of native heart without angina pectoris   2. Mitral valve insufficiency, unspecified etiology   3. Smoking     PLAN:    In order of problems listed above:  CAD/coronary artery calcifications noted on noncontrast chest CT.  Denies chest pain.  Start aspirin 81 mg daily, continue Crestor.  LDL goal less than 70.  Dyspnea on exertion likely from pulmonary etiology. Mild to moderate MR.  Repeat echo 04/2021 showed mild to moderate MR which is stable from prior, EF 60 to 65%.  Plan to repeat echo in about 2 years approximately 04/2023. Patient is a current smoker.  Smoking cessation advised.  Patient has cut back smoking and is working out quitting.  Follow-up after echo in 1 year  This note was generated in part or whole with voice recognition software. Voice recognition is usually quite accurate but there are transcription errors that can and very often do occur. I apologize for any typographical errors that were not detected and corrected.  Medication Adjustments/Labs and Tests Ordered: Current medicines are reviewed at length with the patient today.  Concerns regarding medicines are outlined above.  Orders Placed This  Encounter  Procedures   EKG 12-Lead    Meds ordered this encounter  Medications   aspirin EC 81 MG tablet    Sig: Take 1 tablet (81 mg total) by mouth daily. Swallow whole.    Dispense:  90 tablet    Refill:  3     Patient Instructions  Medication Instructions:   START taking Aspirin 81 MG once a day.  *If you need a refill on your cardiac medications before your next appointment, please call your pharmacy*   Lab Work: None ordered If you have labs (blood work) drawn today and your tests are completely normal, you will receive your results only by: MyChart Message (if you  have MyChart) OR A paper copy in the mail If you have any lab test that is abnormal or we need to change your treatment, we will call you to review the results.   Testing/Procedures: None ordered   Follow-Up: At [facility], you and your health needs are our priority.  As part of our continuing mission to provide you with exceptional heart care, we have created designated Provider Care Teams.  These Care Teams include your primary Cardiologist (physician) and Advanced Practice Providers (APPs -  Physician Assistants and Nurse Practitioners) who all work together to provide you with the care you need, when you need it.  We recommend signing up for the patient portal called "MyChart".  Sign up information is provided on this After Visit Summary.  MyChart is used to connect with patients for Virtual Visits (Telemedicine).  Patients are able to view lab/test results, encounter notes, upcoming appointments, etc.  Non-urgent messages can be sent to your provider as well.   To learn more about what you can do with MyChart, go to [email or web address].    Your next appointment:   1 year(s)  The format for your next appointment:   In Person  Provider:   Debbe Odea, MD   Other Instructions    Signed, Debbe Odea, MD  06/02/2021 5:10 PM    [facility]

## 2021-07-14 ENCOUNTER — Encounter: Payer: Self-pay | Admitting: Nurse Practitioner

## 2021-07-14 ENCOUNTER — Ambulatory Visit (INDEPENDENT_AMBULATORY_CARE_PROVIDER_SITE_OTHER): Payer: Medicare Other | Admitting: Nurse Practitioner

## 2021-07-14 ENCOUNTER — Other Ambulatory Visit: Payer: Self-pay

## 2021-07-14 VITALS — BP 110/66 | Temp 97.5°F | Ht 64.49 in | Wt 134.0 lb

## 2021-07-14 DIAGNOSIS — I2510 Atherosclerotic heart disease of native coronary artery without angina pectoris: Secondary | ICD-10-CM

## 2021-07-14 DIAGNOSIS — I10 Essential (primary) hypertension: Secondary | ICD-10-CM | POA: Diagnosis not present

## 2021-07-14 DIAGNOSIS — R5383 Other fatigue: Secondary | ICD-10-CM | POA: Diagnosis not present

## 2021-07-14 NOTE — Progress Notes (Signed)
BP 110/66   Temp (!) 97.5 F (36.4 C)   Ht 5' 4.49" (1.638 m)   Wt 134 lb (60.8 kg)   SpO2 96%   BMI 22.65 kg/m    Subjective:    Patient ID: Ralph Leon, male    DOB: 1936-04-28, 85 y.o.   MRN: [id number]  HPI: Ralph Leon is a 85 y.o. male  Chief Complaint  Patient presents with   Hypertension   HYPERTENSION Hypertension status: controlled  Satisfied with current treatment? no Duration of hypertension: years BP monitoring frequency:  daily BP range: 130-140/80 BP medication side effects:  no Medication compliance: excellent compliance Previous BP meds:none Aspirin: yes Recurrent headaches: no Visual changes: no Palpitations: no Dyspnea: no Chest pain: no Lower extremity edema: no Dizzy/lightheaded: no   Patient states that he is tired all of the time.  Does not know why. He states nothing has changed.  Some days he does not want to get out of bed.    Relevant past medical, surgical, family and social history reviewed and updated as indicated. Interim medical history since our last visit reviewed. Allergies and medications reviewed and updated.  Review of Systems  Constitutional:  Positive for fatigue.  Eyes:  Negative for visual disturbance.  Respiratory:  Negative for shortness of breath.   Cardiovascular:  Negative for chest pain and leg swelling.  Neurological:  Negative for light-headedness and headaches.   Per HPI unless specifically indicated above     Objective:    BP 110/66   Temp (!) 97.5 F (36.4 C)   Ht 5' 4.49" (1.638 m)   Wt 134 lb (60.8 kg)   SpO2 96%   BMI 22.65 kg/m   Wt Readings from Last 3 Encounters:  07/14/21 134 lb (60.8 kg)  06/02/21 136 lb (61.7 kg)  05/09/21 138 lb (62.6 kg)    Physical Exam Vitals and nursing note reviewed.  Constitutional:      General: He is not in acute distress.    Appearance: Normal appearance. He is not ill-appearing, toxic-appearing or diaphoretic.  HENT:     Head: Normocephalic.      Right Ear: External ear normal.     Left Ear: External ear normal.     Nose: Nose normal. No congestion or rhinorrhea.     Mouth/Throat:     Mouth: Mucous membranes are moist.  Eyes:     General:        Right eye: No discharge.        Left eye: No discharge.     Extraocular Movements: Extraocular movements intact.     Conjunctiva/sclera: Conjunctivae normal.     Pupils: Pupils are equal, round, and reactive to light.  Cardiovascular:     Rate and Rhythm: Normal rate and regular rhythm.     Heart sounds: No murmur heard. Pulmonary:     Effort: Pulmonary effort is normal. No respiratory distress.     Breath sounds: Normal breath sounds. No wheezing, rhonchi or rales.  Abdominal:     General: Abdomen is flat. Bowel sounds are normal.  Musculoskeletal:     Cervical back: Normal range of motion and neck supple.  Skin:    General: Skin is warm and dry.     Capillary Refill: Capillary refill takes less than 2 seconds.  Neurological:     General: No focal deficit present.     Mental Status: He is alert and oriented to person, place, and time.  Psychiatric:  Mood and Affect: Mood normal.        Behavior: Behavior normal.        Thought Content: Thought content normal.        Judgment: Judgment normal.    Results for orders placed or performed in visit on 04/25/21  ECHOCARDIOGRAM COMPLETE  Result Value Ref Range   AR max vel 3.77 cm2   AV Peak grad 3.7 mmHg   Ao pk vel 0.96 m/s   S' Lateral 3.00 cm   Area-P 1/2 3.85 cm2   AV Area VTI 3.51 cm2   AV Mean grad 2.0 mmHg   Single Plane A4C EF 40.8 %   AV Area mean vel 3.30 cm2      Assessment & Plan:   Problem List Items Addressed This Visit       Cardiovascular and Mediastinum   Essential hypertension - Primary    Chronic.  Controlled.  Continue with current medication regimen.  Labs ordered today.  Return to clinic in 3 months for reevaluation.  Call sooner if concerns arise.        Other Visit Diagnoses      Other fatigue       Labs ordered today. Will make recommendations base don lab results.    Relevant Orders   Comp Met (CMET)   CBC w/Diff   TSH        Follow up plan: Return in about 3 months (around 10/14/2021) for BP Check.

## 2021-07-14 NOTE — Assessment & Plan Note (Signed)
Chronic.  Controlled.  Continue with current medication regimen.  Labs ordered today.  Return to clinic in 3 months for reevaluation.  Call sooner if concerns arise.   

## 2021-07-15 LAB — CBC WITH DIFFERENTIAL/PLATELET
Basophils Absolute: 0.1 10*3/uL (ref 0.0–0.2)
Basos: 1 %
EOS (ABSOLUTE): 0.4 10*3/uL (ref 0.0–0.4)
Eos: 5 %
Hematocrit: 42.6 % (ref 37.5–51.0)
Hemoglobin: 13.6 g/dL (ref 13.0–17.7)
Immature Grans (Abs): 0 10*3/uL (ref 0.0–0.1)
Immature Granulocytes: 0 %
Lymphocytes Absolute: 2 10*3/uL (ref 0.7–3.1)
Lymphs: 25 %
MCH: 27.9 pg (ref 26.6–33.0)
MCHC: 31.9 g/dL (ref 31.5–35.7)
MCV: 87 fL (ref 79–97)
Monocytes Absolute: 0.8 10*3/uL (ref 0.1–0.9)
Monocytes: 10 %
Neutrophils Absolute: 4.6 10*3/uL (ref 1.4–7.0)
Neutrophils: 59 %
Platelets: 203 10*3/uL (ref 150–450)
RBC: 4.88 x10E6/uL (ref 4.14–5.80)
RDW: 13.6 % (ref 11.6–15.4)
WBC: 7.8 10*3/uL (ref 3.4–10.8)

## 2021-07-15 LAB — COMPREHENSIVE METABOLIC PANEL
ALT: 9 IU/L (ref 0–44)
AST: 12 IU/L (ref 0–40)
Albumin/Globulin Ratio: 2.3 — ABNORMAL HIGH (ref 1.2–2.2)
Albumin: 4.5 g/dL (ref 3.6–4.6)
Alkaline Phosphatase: 73 IU/L (ref 44–121)
BUN/Creatinine Ratio: 16 (ref 10–24)
BUN: 15 mg/dL (ref 8–27)
Bilirubin Total: 0.6 mg/dL (ref 0.0–1.2)
CO2: 22 mmol/L (ref 20–29)
Calcium: 9.4 mg/dL (ref 8.6–10.2)
Chloride: 103 mmol/L (ref 96–106)
Creatinine, Ser: 0.95 mg/dL (ref 0.76–1.27)
Globulin, Total: 2 g/dL (ref 1.5–4.5)
Glucose: 95 mg/dL (ref 65–99)
Potassium: 4.7 mmol/L (ref 3.5–5.2)
Sodium: 142 mmol/L (ref 134–144)
Total Protein: 6.5 g/dL (ref 6.0–8.5)
eGFR: 79 mL/min/{1.73_m2} (ref >59)

## 2021-07-15 LAB — TSH: TSH: 2.03 u[IU]/mL (ref 0.450–4.500)

## 2021-07-15 NOTE — Progress Notes (Signed)
Please let patient know that his lab work looks good.  No reason for his symptoms.  Recommend he increase his water intake and make sure he is eating plenty of protein.

## 2021-10-15 ENCOUNTER — Ambulatory Visit: Payer: Medicare Other | Admitting: Nurse Practitioner

## 2021-10-24 ENCOUNTER — Encounter: Payer: Self-pay | Admitting: Nurse Practitioner

## 2021-10-24 ENCOUNTER — Ambulatory Visit (INDEPENDENT_AMBULATORY_CARE_PROVIDER_SITE_OTHER): Payer: Medicare Other | Admitting: Nurse Practitioner

## 2021-10-24 ENCOUNTER — Other Ambulatory Visit: Payer: Self-pay

## 2021-10-24 VITALS — BP 168/91 | HR 91 | Temp 97.7°F | Ht 64.0 in | Wt 134.6 lb

## 2021-10-24 DIAGNOSIS — I10 Essential (primary) hypertension: Secondary | ICD-10-CM

## 2021-10-24 DIAGNOSIS — R7301 Impaired fasting glucose: Secondary | ICD-10-CM | POA: Diagnosis not present

## 2021-10-24 DIAGNOSIS — R5383 Other fatigue: Secondary | ICD-10-CM

## 2021-10-24 DIAGNOSIS — R531 Weakness: Secondary | ICD-10-CM

## 2021-10-24 DIAGNOSIS — I2510 Atherosclerotic heart disease of native coronary artery without angina pectoris: Secondary | ICD-10-CM

## 2021-10-24 DIAGNOSIS — R4189 Other symptoms and signs involving cognitive functions and awareness: Secondary | ICD-10-CM

## 2021-10-24 DIAGNOSIS — E7800 Pure hypercholesterolemia, unspecified: Secondary | ICD-10-CM | POA: Diagnosis not present

## 2021-10-24 DIAGNOSIS — W19XXXD Unspecified fall, subsequent encounter: Secondary | ICD-10-CM

## 2021-10-24 DIAGNOSIS — Z8673 Personal history of transient ischemic attack (TIA), and cerebral infarction without residual deficits: Secondary | ICD-10-CM

## 2021-10-24 DIAGNOSIS — I700 Atherosclerosis of aorta: Secondary | ICD-10-CM

## 2021-10-24 MED ORDER — ROSUVASTATIN CALCIUM 10 MG PO TABS: 10.0000 mg | ORAL_TABLET | Freq: Every day | ORAL | 1 refills | Status: DC

## 2021-10-24 MED ORDER — LISINOPRIL 5 MG PO TABS: 5.0000 mg | ORAL_TABLET | Freq: Every day | ORAL | 0 refills | Status: DC

## 2021-10-24 NOTE — Assessment & Plan Note (Signed)
Patient had a fall due to hypotension and weakness earlier this year.  Will stop patient's crestor.  Labs ordered during visit.  MRI ordered to evaluate for possible dementia.  Follow up in 1 month.  Will make recommendations based on lab and imaging results.

## 2021-10-24 NOTE — Assessment & Plan Note (Signed)
Labs ordered today.  Will make recommendations based on lab results. ?

## 2021-10-24 NOTE — Assessment & Plan Note (Signed)
Labs ordered today.  Will stop crestor due to age and recent symptoms.

## 2021-10-24 NOTE — Assessment & Plan Note (Signed)
Chronic. Elevated at visit and at home. Will restart Lisinopril 5mg  daily.  Will go slow with patient's blood pressure due to recent history of hypotension and falls.  Follow up in 1 month for reevaluation.

## 2021-10-24 NOTE — Assessment & Plan Note (Signed)
Chronic. Controlled. He is taking 81mg  of ASA as recommended by the Cardiologist.  Will stop the Crestor due to patient's current symptoms of fatigue and weakness. Continue to follow up with Cardiology.

## 2021-10-24 NOTE — Progress Notes (Signed)
BP (!) 168/91 (BP Location: Left Arm, Cuff Size: Normal)   Pulse 91   Temp 97.7 F (36.5 C) (Oral)   Ht _0  (1.626 m)   Wt 134 lb 9.6 oz (61.1 kg)   SpO2 98%   BMI 23.10 kg/m    Subjective:    Patient ID: Ralph Leon, male    DOB: [birth year], 85 y.o.   MRN: [id number]  HPI: Ralph Leon is a 85 y.o. male  Chief Complaint  Patient presents with   Hyperlipidemia   Hypertension    HYPERTENSION Hypertension status: controlled  Satisfied with current treatment? no Duration of hypertension: years BP monitoring frequency:  daily BP range: 170/100 BP medication side effects:  no Medication compliance: excellent compliance Previous BP meds:none- was taken off medication after several falls. Has noticed blood pressure is increasing again.  Has a lot of fatigue and not a lot of energy.  Aspirin: yes Recurrent headaches: no Visual changes: no Palpitations: no Dyspnea: no Chest pain: no Lower extremity edema: no Dizzy/lightheaded: no  COPD COPD status: controlled Satisfied with current treatment?: yes Oxygen use: no Dyspnea frequency:  Cough frequency:  Rescue inhaler frequency:   Limitation of activity: no Productive cough:  Last Spirometry:  Pneumovax: Not up to Date Influenza: Not up to Date  Patient states that he is tired all of the time.  Does not know why. He states nothing has changed.  Some days he does not want to get out of bed. He has noticed a decline in his memory recently.  States his chin moves strangely in the mornings.  Is not having symptoms in office today.    Relevant past medical, surgical, family and social history reviewed and updated as indicated. Interim medical history since our last visit reviewed. Allergies and medications reviewed and updated.  Review of Systems  Constitutional:  Positive for fatigue.  Eyes:  Negative for visual disturbance.  Respiratory:  Negative for shortness of breath.   Cardiovascular:  Negative for chest  pain and leg swelling.  Neurological:  Positive for tremors and weakness. Negative for light-headedness and headaches.   Per HPI unless specifically indicated above     Objective:    BP (!) 168/91 (BP Location: Left Arm, Cuff Size: Normal)   Pulse 91   Temp 97.7 F (36.5 C) (Oral)   Ht _1  (1.626 m)   Wt 134 lb 9.6 oz (61.1 kg)   SpO2 98%   BMI 23.10 kg/m   Wt Readings from Last 3 Encounters:  10/24/21 134 lb 9.6 oz (61.1 kg)  07/14/21 134 lb (60.8 kg)  06/02/21 136 lb (61.7 kg)    Physical Exam Vitals and nursing note reviewed.  Constitutional:      General: He is not in acute distress.    Appearance: Normal appearance. He is not ill-appearing, toxic-appearing or diaphoretic.  HENT:     Head: Normocephalic.     Right Ear: External ear normal.     Left Ear: External ear normal.     Nose: Nose normal. No congestion or rhinorrhea.     Mouth/Throat:     Mouth: Mucous membranes are moist.  Eyes:     General:        Right eye: No discharge.        Left eye: No discharge.     Extraocular Movements: Extraocular movements intact.     Conjunctiva/sclera: Conjunctivae normal.     Pupils: Pupils are equal, round, and reactive to light.  Cardiovascular:     Rate and Rhythm: Normal rate and regular rhythm.     Heart sounds: No murmur heard. Pulmonary:     Effort: Pulmonary effort is normal. No respiratory distress.     Breath sounds: Normal breath sounds. No wheezing, rhonchi or rales.  Abdominal:     General: Abdomen is flat. Bowel sounds are normal.  Musculoskeletal:     Cervical back: Normal range of motion and neck supple.     Comments: Uses walker.   Skin:    General: Skin is warm and dry.     Capillary Refill: Capillary refill takes less than 2 seconds.  Neurological:     General: No focal deficit present.     Mental Status: He is alert and oriented to person, place, and time.  Psychiatric:        Mood and Affect: Mood normal.        Behavior: Behavior normal.         Thought Content: Thought content normal.        Judgment: Judgment normal.    Results for orders placed or performed in visit on 07/14/21  Comp Met (CMET)  Result Value Ref Range   Glucose 95 65 - 99 mg/dL   BUN 15 8 - 27 mg/dL   Creatinine, Ser 0.95 0.76 - 1.27 mg/dL   eGFR 79 >59 mL/min/1.73   BUN/Creatinine Ratio 16 10 - 24   Sodium 142 134 - 144 mmol/L   Potassium 4.7 3.5 - 5.2 mmol/L   Chloride 103 96 - 106 mmol/L   CO2 22 20 - 29 mmol/L   Calcium 9.4 8.6 - 10.2 mg/dL   Total Protein 6.5 6.0 - 8.5 g/dL   Albumin 4.5 3.6 - 4.6 g/dL   Globulin, Total 2.0 1.5 - 4.5 g/dL   Albumin/Globulin Ratio 2.3 (H) 1.2 - 2.2   Bilirubin Total 0.6 0.0 - 1.2 mg/dL   Alkaline Phosphatase 73 44 - 121 IU/L   AST 12 0 - 40 IU/L   ALT 9 0 - 44 IU/L  CBC w/Diff  Result Value Ref Range   WBC 7.8 3.4 - 10.8 x10E3/uL   RBC 4.88 4.14 - 5.80 x10E6/uL   Hemoglobin 13.6 13.0 - 17.7 g/dL   Hematocrit 42.6 37.5 - 51.0 %   MCV 87 79 - 97 fL   MCH 27.9 26.6 - 33.0 pg   MCHC 31.9 31.5 - 35.7 g/dL   RDW 13.6 11.6 - 15.4 %   Platelets 203 150 - 450 x10E3/uL   Neutrophils 59 Not Estab. %   Lymphs 25 Not Estab. %   Monocytes 10 Not Estab. %   Eos 5 Not Estab. %   Basos 1 Not Estab. %   Neutrophils Absolute 4.6 1.4 - 7.0 x10E3/uL   Lymphocytes Absolute 2.0 0.7 - 3.1 x10E3/uL   Monocytes Absolute 0.8 0.1 - 0.9 x10E3/uL   EOS (ABSOLUTE) 0.4 0.0 - 0.4 x10E3/uL   Basophils Absolute 0.1 0.0 - 0.2 x10E3/uL   Immature Granulocytes 0 Not Estab. %   Immature Grans (Abs) 0.0 0.0 - 0.1 x10E3/uL  TSH  Result Value Ref Range   TSH 2.030 0.450 - 4.500 uIU/mL      Assessment & Plan:   Problem List Items Addressed This Visit       Cardiovascular and Mediastinum   Essential hypertension    Chronic. Elevated at visit and at home. Will restart Lisinopril 75m daily.  Will go slow with patient's blood pressure due  to recent history of hypotension and falls.  Follow up in 1 month for reevaluation.        Relevant Medications   lisinopril (ZESTRIL) 5 MG tablet   Other Relevant Orders   Comp Met (CMET)   Aortic atherosclerosis ([place]) - Primary    Chronic. Controlled. He is taking 18m of ASA as recommended by the Cardiologist.  Will stop the Crestor due to patient's current symptoms of fatigue and weakness. Continue to follow up with Cardiology.       Relevant Medications   lisinopril (ZESTRIL) 5 MG tablet     Endocrine   IFG (impaired fasting glucose)    Labs ordered today. Will make recommendations based on lab results.       Relevant Orders   HgB A1c     Other   History of CVA (cerebrovascular accident)   Relevant Orders   [facility].  Will stop crestor due to age and recent symptoms.       Relevant Medications   lisinopril (ZESTRIL) 5 MG tablet   Other Relevant Orders   Lipid Profile   Fall    Patient had a fall due to hypotension and weakness earlier this year.  Will stop patient's crestor.  Labs ordered during visit.  MRI ordered to evaluate for possible dementia.  Follow up in 1 month.  Will make recommendations based on lab and imaging results.      Other Visit Diagnoses     Other fatigue       Labs ordered today. WIll make recommendations based on lab results.    Relevant Orders   CBC w/Diff   MR Brain W Wo Contrast   Cognitive change       Patient has noticed a decline in memory. Will order MRI of brain and Labs at visit today. Will make recommendations based on results.    Relevant Orders   RPR   Weakness       Patient has noticed a decline in memory. Will order MRI of brain and Labs at visit today. Will make recommendations based on results.    Relevant Orders   MR Brain W Wo Contrast   RPR        Follow up plan: Return in about 1 month (around 11/24/2021) for Follow up after MRI.

## 2021-10-25 LAB — LIPID PANEL
Chol/HDL Ratio: 3.2 ratio (ref 0.0–5.0)
Cholesterol, Total: 212 mg/dL — ABNORMAL HIGH (ref 100–199)
HDL: 67 mg/dL (ref >39)
LDL Chol Calc (NIH): 123 mg/dL — ABNORMAL HIGH (ref 0–99)
Triglycerides: 127 mg/dL (ref 0–149)
VLDL Cholesterol Cal: 22 mg/dL (ref 5–40)

## 2021-10-25 LAB — CBC WITH DIFFERENTIAL/PLATELET
Basophils Absolute: 0 10*3/uL (ref 0.0–0.2)
Basos: 0 %
EOS (ABSOLUTE): 0.3 10*3/uL (ref 0.0–0.4)
Eos: 3 %
Hematocrit: 43.5 % (ref 37.5–51.0)
Hemoglobin: 14.2 g/dL (ref 13.0–17.7)
Immature Grans (Abs): 0 10*3/uL (ref 0.0–0.1)
Immature Granulocytes: 0 %
Lymphocytes Absolute: 1.5 10*3/uL (ref 0.7–3.1)
Lymphs: 19 %
MCH: 28.6 pg (ref 26.6–33.0)
MCHC: 32.6 g/dL (ref 31.5–35.7)
MCV: 88 fL (ref 79–97)
Monocytes Absolute: 0.7 10*3/uL (ref 0.1–0.9)
Monocytes: 8 %
Neutrophils Absolute: 5.6 10*3/uL (ref 1.4–7.0)
Neutrophils: 70 %
Platelets: 219 10*3/uL (ref 150–450)
RBC: 4.97 x10E6/uL (ref 4.14–5.80)
RDW: 14 % (ref 11.6–15.4)
WBC: 8.1 10*3/uL (ref 3.4–10.8)

## 2021-10-25 LAB — COMPREHENSIVE METABOLIC PANEL
ALT: 5 IU/L (ref 0–44)
AST: 9 IU/L (ref 0–40)
Albumin/Globulin Ratio: 2 (ref 1.2–2.2)
Albumin: 4.3 g/dL (ref 3.6–4.6)
Alkaline Phosphatase: 78 IU/L (ref 44–121)
BUN/Creatinine Ratio: 16 (ref 10–24)
BUN: 16 mg/dL (ref 8–27)
Bilirubin Total: 0.5 mg/dL (ref 0.0–1.2)
CO2: 27 mmol/L (ref 20–29)
Calcium: 9.4 mg/dL (ref 8.6–10.2)
Chloride: 104 mmol/L (ref 96–106)
Creatinine, Ser: 0.98 mg/dL (ref 0.76–1.27)
Globulin, Total: 2.2 g/dL (ref 1.5–4.5)
Glucose: 89 mg/dL (ref 70–99)
Potassium: 4.6 mmol/L (ref 3.5–5.2)
Sodium: 145 mmol/L — ABNORMAL HIGH (ref 134–144)
Total Protein: 6.5 g/dL (ref 6.0–8.5)
eGFR: 77 mL/min/{1.73_m2} (ref >59)

## 2021-10-25 LAB — HEMOGLOBIN A1C
Est. average glucose Bld gHb Est-mCnc: 128 mg/dL
Hgb A1c MFr Bld: 6.1 % — ABNORMAL HIGH (ref 4.8–5.6)

## 2021-10-25 LAB — RPR: RPR Ser Ql: NONREACTIVE

## 2021-10-27 NOTE — Progress Notes (Signed)
Please let patient know that overall his lab work looks good.  No concerns at this time.  We will continue to monitor.  Please remind him that he needs to get the MRI of his head.  Please let me know if he has any questions.

## 2021-11-12 ENCOUNTER — Ambulatory Visit
Admission: RE | Admit: 2021-11-12 | Discharge: 2021-11-12 | Disposition: A | Discharge status: Home / Self Care | Payer: Medicare Other | Source: Ambulatory Visit | Attending: Nurse Practitioner | Admitting: Nurse Practitioner

## 2021-11-12 DIAGNOSIS — R5383 Other fatigue: Secondary | ICD-10-CM | POA: Diagnosis present

## 2021-11-12 DIAGNOSIS — R531 Weakness: Secondary | ICD-10-CM | POA: Diagnosis present

## 2021-11-12 DIAGNOSIS — Z8673 Personal history of transient ischemic attack (TIA), and cerebral infarction without residual deficits: Secondary | ICD-10-CM

## 2021-11-12 MED ORDER — GADOBUTROL 1 MMOL/ML IV SOLN
6.0000 mL | Freq: Once | INTRAVENOUS | Status: AC | PRN
  Administered 2021-11-12: 13:00:00 6 mL via INTRAVENOUS

## 2021-11-12 NOTE — Progress Notes (Signed)
Please call and let patient know that he has had a small stroke recently and a couple of old strokes were noted on his MRI. At this point, we want to prevent further strokes so he should continue with his aspirin daily.  He should continue with the home health physical therapy. I also recommend that he see a neurologist.  If he is okay with that, I will put in the referral.

## 2021-11-24 ENCOUNTER — Ambulatory Visit: Payer: Medicare Other | Admitting: Nurse Practitioner

## 2021-11-26 ENCOUNTER — Ambulatory Visit (INDEPENDENT_AMBULATORY_CARE_PROVIDER_SITE_OTHER): Payer: Medicare Other | Admitting: Nurse Practitioner

## 2021-11-26 ENCOUNTER — Encounter: Payer: Self-pay | Admitting: Nurse Practitioner

## 2021-11-26 ENCOUNTER — Ambulatory Visit (INDEPENDENT_AMBULATORY_CARE_PROVIDER_SITE_OTHER): Payer: Medicare Other | Admitting: *Deleted

## 2021-11-26 ENCOUNTER — Other Ambulatory Visit: Payer: Self-pay

## 2021-11-26 VITALS — BP 152/78 | HR 89 | Temp 97.6°F | Wt 132.6 lb

## 2021-11-26 DIAGNOSIS — I10 Essential (primary) hypertension: Secondary | ICD-10-CM

## 2021-11-26 DIAGNOSIS — I639 Cerebral infarction, unspecified: Secondary | ICD-10-CM | POA: Diagnosis not present

## 2021-11-26 DIAGNOSIS — Z0000 Encounter for general adult medical examination without abnormal findings: Secondary | ICD-10-CM | POA: Diagnosis not present

## 2021-11-26 MED ORDER — LISINOPRIL 10 MG PO TABS: 10.0000 mg | ORAL_TABLET | Freq: Every day | ORAL | 1 refills | Status: DC

## 2021-11-26 NOTE — Progress Notes (Signed)
BP (!) 152/78 (BP Location: Right Arm, Cuff Size: Normal)    Pulse 89    Temp 97.6 F (36.4 C) (Oral)    Wt 132 lb 9.6 oz (60.1 kg)    SpO2 92%    BMI 22.76 kg/m    Subjective:    Patient ID: Ralph Leon, male    DOB: October 15, 1936, 86 y.o.   MRN: [id number]  HPI: Ralph Leon is a 86 y.o. male  Chief Complaint  Patient presents with   Hypertension   Aortic Atherosclerosis    HYPERTENSION Hypertension status: controlled  Satisfied with current treatment? no Duration of hypertension: years BP monitoring frequency:  daily BP range: 170/100- hasn't improved since restarting the Lisinopril. BP medication side effects:  no Medication compliance: excellent compliance Previous BP meds:lisinopril Aspirin: yes Recurrent headaches: no Visual changes: no Palpitations: no Dyspnea: no Chest pain: no Lower extremity edema: no Dizzy/lightheaded: no   Patient states that he is tired all of the time.  Not much has changed since our last visit.  He is having home health.  He is working with physical therapy to get some strength back.    Relevant past medical, surgical, family and social history reviewed and updated as indicated. Interim medical history since our last visit reviewed. Allergies and medications reviewed and updated.  Review of Systems  Constitutional:  Positive for fatigue.  Eyes:  Negative for visual disturbance.  Respiratory:  Negative for shortness of breath.   Cardiovascular:  Negative for chest pain and leg swelling.  Neurological:  Positive for weakness. Negative for light-headedness and headaches.   Per HPI unless specifically indicated above     Objective:    BP (!) 152/78 (BP Location: Right Arm, Cuff Size: Normal)    Pulse 89    Temp 97.6 F (36.4 C) (Oral)    Wt 132 lb 9.6 oz (60.1 kg)    SpO2 92%    BMI 22.76 kg/m   Wt Readings from Last 3 Encounters:  11/26/21 132 lb 9.6 oz (60.1 kg)  10/24/21 134 lb 9.6 oz (61.1 kg)  07/14/21 134 lb (60.8 kg)     Physical Exam Vitals and nursing note reviewed.  Constitutional:      General: He is not in acute distress.    Appearance: Normal appearance. He is not ill-appearing, toxic-appearing or diaphoretic.  HENT:     Head: Normocephalic.     Right Ear: External ear normal.     Left Ear: External ear normal.     Nose: Nose normal. No congestion or rhinorrhea.     Mouth/Throat:     Mouth: Mucous membranes are moist.  Eyes:     General:        Right eye: No discharge.        Left eye: No discharge.     Extraocular Movements: Extraocular movements intact.     Conjunctiva/sclera: Conjunctivae normal.     Pupils: Pupils are equal, round, and reactive to light.  Cardiovascular:     Rate and Rhythm: Normal rate and regular rhythm.     Heart sounds: No murmur heard. Pulmonary:     Effort: Pulmonary effort is normal. No respiratory distress.     Breath sounds: Normal breath sounds. No wheezing, rhonchi or rales.  Abdominal:     General: Abdomen is flat. Bowel sounds are normal.  Musculoskeletal:     Cervical back: Normal range of motion and neck supple.     Comments: Uses walker.   Skin:  General: Skin is warm and dry.     Capillary Refill: Capillary refill takes less than 2 seconds.  Neurological:     General: No focal deficit present.     Mental Status: He is alert and oriented to person, place, and time.  Psychiatric:        Mood and Affect: Mood normal.        Behavior: Behavior normal.        Thought Content: Thought content normal.        Judgment: Judgment normal.    Results for orders placed or performed in visit on 10/24/21  Comp Met (CMET)  Result Value Ref Range   Glucose 89 70 - 99 mg/dL   BUN 16 8 - 27 mg/dL   Creatinine, Ser 0.98 0.76 - 1.27 mg/dL   eGFR 77 >59 mL/min/1.73   BUN/Creatinine Ratio 16 10 - 24   Sodium 145 (H) 134 - 144 mmol/L   Potassium 4.6 3.5 - 5.2 mmol/L   Chloride 104 96 - 106 mmol/L   CO2 27 20 - 29 mmol/L   Calcium 9.4 8.6 - 10.2 mg/dL    Total Protein 6.5 6.0 - 8.5 g/dL   Albumin 4.3 3.6 - 4.6 g/dL   Globulin, Total 2.2 1.5 - 4.5 g/dL   Albumin/Globulin Ratio 2.0 1.2 - 2.2   Bilirubin Total 0.5 0.0 - 1.2 mg/dL   Alkaline Phosphatase 78 44 - 121 IU/L   AST 9 0 - 40 IU/L   ALT 5 0 - 44 IU/L  HgB A1c  Result Value Ref Range   Hgb A1c MFr Bld 6.1 (H) 4.8 - 5.6 %   Est. average glucose Bld gHb Est-mCnc 128 mg/dL  Lipid Profile  Result Value Ref Range   Cholesterol, Total 212 (H) 100 - 199 mg/dL   Triglycerides 127 0 - 149 mg/dL   HDL 67 >39 mg/dL   VLDL Cholesterol Cal 22 5 - 40 mg/dL   LDL Chol Calc (NIH) 123 (H) 0 - 99 mg/dL   Chol/HDL Ratio 3.2 0.0 - 5.0 ratio  CBC w/Diff  Result Value Ref Range   WBC 8.1 3.4 - 10.8 x10E3/uL   RBC 4.97 4.14 - 5.80 x10E6/uL   Hemoglobin 14.2 13.0 - 17.7 g/dL   Hematocrit 43.5 37.5 - 51.0 %   MCV 88 79 - 97 fL   MCH 28.6 26.6 - 33.0 pg   MCHC 32.6 31.5 - 35.7 g/dL   RDW 14.0 11.6 - 15.4 %   Platelets 219 150 - 450 x10E3/uL   Neutrophils 70 Not Estab. %   Lymphs 19 Not Estab. %   Monocytes 8 Not Estab. %   Eos 3 Not Estab. %   Basos 0 Not Estab. %   Neutrophils Absolute 5.6 1.4 - 7.0 x10E3/uL   Lymphocytes Absolute 1.5 0.7 - 3.1 x10E3/uL   Monocytes Absolute 0.7 0.1 - 0.9 x10E3/uL   EOS (ABSOLUTE) 0.3 0.0 - 0.4 x10E3/uL   Basophils Absolute 0.0 0.0 - 0.2 x10E3/uL   Immature Granulocytes 0 Not Estab. %   Immature Grans (Abs) 0.0 0.0 - 0.1 x10E3/uL  RPR  Result Value Ref Range   RPR Ser Ql Non Reactive Non Reactive      Assessment & Plan:   Problem List Items Addressed This Visit       Cardiovascular and Mediastinum   Essential hypertension - Primary    Chronic. Elevated at home. Will increase Lisinopril to 60m daily. Discussed side effects and benefits. Discussed  symptoms of low blood pressure with patient during visit. Follow up in 2 months for reevaluation.      Relevant Medications   lisinopril (ZESTRIL) 10 MG tablet   Stroke [facility])    Reviewed imaging  with patient during patient today. Discussed referral to Neurology. Patient would like to hold off for now. Will continue with Aspirin daily. Continue with Home health and physical therapy. Follow up in 2 months.      Relevant Medications   lisinopril (ZESTRIL) 10 MG tablet     Follow up plan: Return in about 2 months (around 01/24/2022) for BP Check.

## 2021-11-26 NOTE — Assessment & Plan Note (Signed)
Chronic. Elevated at home. Will increase Lisinopril to 10mg  daily. Discussed side effects and benefits. Discussed symptoms of low blood pressure with patient during visit. Follow up in 2 months for reevaluation.

## 2021-11-26 NOTE — Assessment & Plan Note (Signed)
Reviewed imaging with patient during patient today. Discussed referral to Neurology. Patient would like to hold off for now. Will continue with Aspirin daily. Continue with Home health and physical therapy. Follow up in 2 months.

## 2021-11-26 NOTE — Patient Instructions (Signed)
Ralph Leon , Thank you for taking time to come for your Medicare Wellness Visit. I appreciate your ongoing commitment to your health goals. Please review the following plan we discussed and let me know if I can assist you in the future.   Screening recommendations/referrals: Colonoscopy: no longer indicated Recommended yearly ophthalmology/optometry visit for glaucoma screening and checkup Recommended yearly dental visit for hygiene and checkup  Vaccinations: Influenza vaccine: Education provided Pneumococcal vaccine: Education provided Tdap vaccine: Education provided Shingles vaccine: Education provided    Advanced directives: Education provided  Conditions/risks identified:   Next appointment: 11-26-2021 @ 3:20 [facility]  Preventive Care 84 Years and Older, Male Preventive care refers to lifestyle choices and visits with your health care provider that can promote health and wellness. What does preventive care include? A yearly physical exam. This is also called an annual well check. Dental exams once or twice a year. Routine eye exams. Ask your health care provider how often you should have your eyes checked. Personal lifestyle choices, including: Daily care of your teeth and gums. Regular physical activity. Eating a healthy diet. Avoiding tobacco and drug use. Limiting alcohol use. Practicing safe sex. Taking low d0ses of aspirin every day. Taking vitamin and mineral supplements as recommended by your health care provider. What happens during an annual well check? The services and screenings done by your health care provider during your annual well check will depend on your age, overall health, lifestyle risk factors, and family history of disease. Counseling  Your health care provider may ask you questions about your: Alcohol use. Tobacco use. Drug use. Emotional well-being. Home and relationship well-being. Sexual activity. Eating habits. History of falls. Memory  and ability to understand (cognition). Work and work Astronomer. Screening  You may have the following tests or measurements: Height, weight, and BMI. Blood pressure. Lipid and cholesterol levels. These may be checked every 5 years, or more frequently if you are over 19 years old. Skin check. Lung cancer screening. You may have this screening every year starting at age 84 if you have a 30-pack-year history of smoking and currently smoke or have quit within the past 15 years. Fecal occult blood test (FOBT) of the stool. You may have this test every year starting at age 84. Flexible sigmoidoscopy or colonoscopy. You may have a sigmoidoscopy every 5 years or a colonoscopy every 10 years starting at age 84. Prostate cancer screening. Recommendations will vary depending on your family history and other risks. Hepatitis C blood test. Hepatitis B blood test. Sexually transmitted disease (STD) testing. Diabetes screening. This is done by checking your blood sugar (glucose) after you have not eaten for a while (fasting). You may have this done every 1-3 years. Abdominal aortic aneurysm (AAA) screening. You may need this if you are a current or former smoker. Osteoporosis. You may be screened starting at age 84 if you are at high risk. Talk with your health care provider about your test results, treatment options, and if necessary, the need for more tests. Vaccines  Your health care provider may recommend certain vaccines, such as: Influenza vaccine. This is recommended every year. Tetanus, diphtheria, and acellular pertussis (Tdap, Td) vaccine. You may need a Td booster every 10 years. Zoster vaccine. You may need this after age 84. Pneumococcal 13-valent conjugate (PCV13) vaccine. One dose is recommended after age 84. Pneumococcal polysaccharide (PPSV23) vaccine. One dose is recommended after age 84. Talk to your health care provider about which screenings and vaccines you need and  how often you  need them. This information is not intended to replace advice given to you by your health care provider. Make sure you discuss any questions you have with your health care provider. Document Released: 12/06/2015 Document Revised: 07/29/2016 Document Reviewed: 09/10/2015 Elsevier Interactive Patient Education  2017 [employer]  Fall Prevention in the Home Falls can cause injuries. They can happen to people of all ages. There are many things you can do to make your home safe and to help prevent falls. What can I do on the outside of my home? Regularly fix the edges of walkways and driveways and fix any cracks. Remove anything that might make you trip as you walk through a door, such as a raised step or threshold. Trim any bushes or trees on the path to your home. Use bright outdoor lighting. Clear any walking paths of anything that might make someone trip, such as rocks or tools. Regularly check to see if handrails are loose or broken. Make sure that both sides of any steps have handrails. Any raised decks and porches should have guardrails on the edges. Have any leaves, snow, or ice cleared regularly. Use sand or salt on walking paths during winter. Clean up any spills in your garage right away. This includes oil or grease spills. What can I do in the bathroom? Use night lights. Install grab bars by the toilet and in the tub and shower. Do not use towel bars as grab bars. Use non-skid mats or decals in the tub or shower. If you need to sit down in the shower, use a plastic, non-slip stool. Keep the floor dry. Clean up any water that spills on the floor as soon as it happens. Remove soap buildup in the tub or shower regularly. Attach bath mats securely with double-sided non-slip rug tape. Do not have throw rugs and other things on the floor that can make you trip. What can I do in the bedroom? Use night lights. Make sure that you have a light by your bed that is easy to reach. Do not  use any sheets or blankets that are too big for your bed. They should not hang down onto the floor. Have a firm chair that has side arms. You can use this for support while you get dressed. Do not have throw rugs and other things on the floor that can make you trip. What can I do in the kitchen? Clean up any spills right away. Avoid walking on wet floors. Keep items that you use a lot in easy-to-reach places. If you need to reach something above you, use a strong step stool that has a grab bar. Keep electrical cords out of the way. Do not use floor polish or wax that makes floors slippery. If you must use wax, use non-skid floor wax. Do not have throw rugs and other things on the floor that can make you trip. What can I do with my stairs? Do not leave any items on the stairs. Make sure that there are handrails on both sides of the stairs and use them. Fix handrails that are broken or loose. Make sure that handrails are as long as the stairways. Check any carpeting to make sure that it is firmly attached to the stairs. Fix any carpet that is loose or worn. Avoid having throw rugs at the top or bottom of the stairs. If you do have throw rugs, attach them to the floor with carpet tape. Make sure that you have  a light switch at the top of the stairs and the bottom of the stairs. If you do not have them, ask someone to add them for you. What else can I do to help prevent falls? Wear shoes that: Do not have high heels. Have rubber bottoms. Are comfortable and fit you well. Are closed at the toe. Do not wear sandals. If you use a stepladder: Make sure that it is fully opened. Do not climb a closed stepladder. Make sure that both sides of the stepladder are locked into place. Ask someone to hold it for you, if possible. Clearly mark and make sure that you can see: Any grab bars or handrails. First and last steps. Where the edge of each step is. Use tools that help you move around (mobility  aids) if they are needed. These include: Canes. Walkers. Scooters. Crutches. Turn on the lights when you go into a dark area. Replace any light bulbs as soon as they burn out. Set up your furniture so you have a clear path. Avoid moving your furniture around. If any of your floors are uneven, fix them. If there are any pets around you, be aware of where they are. Review your medicines with your doctor. Some medicines can make you feel dizzy. This can increase your chance of falling. Ask your doctor what other things that you can do to help prevent falls. This information is not intended to replace advice given to you by your health care provider. Make sure you discuss any questions you have with your health care provider. Document Released: 09/05/2009 Document Revised: 04/16/2016 Document Reviewed: 12/14/2014 Elsevier Interactive Patient Education  2017 [employer].

## 2021-11-26 NOTE — Progress Notes (Signed)
Subjective:   Ralph Leon is a 86 y.o. male who presents for Medicare Annual/Subsequent preventive examination.  I connected with  Ralph Leon on 11/26/21 by a  telephone enabled telemedicine application and verified that I am speaking with the correct person using two identifiers.   I discussed the limitations of evaluation and management by telemedicine. The patient expressed understanding and agreed to proceed.  Patient location: home  Provider location: Tele-Health not in clinic    Review of Systems     Cardiac Risk Factors include: advanced age (>30men, >65 women);hypertension;male gender;sedentary lifestyle     Objective:    Today's Vitals   There is no height or weight on file to calculate BMI.  Advanced Directives 11/26/2021 11/25/2020  Does Patient Have a Medical Advance Directive? No No  Would patient like information on creating a medical advance directive? No - Patient declined -    Current Medications (verified) Outpatient Encounter Medications as of 11/26/2021  Medication Sig   aspirin EC 81 MG tablet Take 1 tablet (81 mg total) by mouth daily. Swallow whole.   lisinopril (ZESTRIL) 5 MG tablet Take 1 tablet (5 mg total) by mouth daily.   No facility-administered encounter medications on file as of 11/26/2021.    Allergies (verified) Patient has no known allergies.   History: Past Medical History:  Diagnosis Date   COPD (chronic obstructive pulmonary disease) ([place])    Hypertension    Stroke ([place])    No past surgical history on file. Family History  Problem Relation Age of Onset   Diabetes Mother    Heart attack Father    Social History   Socioeconomic History   Marital status: Single    Spouse name: Not on file   Number of children: Not on file   Years of education: Not on file   Highest education level: Not on file  Occupational History   Occupation: retired  Tobacco Use   Smoking status: Every Day    Packs/day: 0.50    Years: 64.00     Pack years: 32.00    Types: Cigarettes   Smokeless tobacco: Never   Tobacco comments:    5-6 a day. 03/06/2021  Vaping Use   Vaping Use: Never used  Substance and Sexual Activity   Alcohol use: Not Currently   Drug use: Not Currently   Sexual activity: Not Currently  Other Topics Concern   Not on file  Social History Narrative   Not on file   Social Determinants of Health   Financial Resource Strain: Low Risk    Difficulty of Paying Living Expenses: Not hard at all  Food Insecurity: No Food Insecurity   Worried About Charity fundraiser in the Last Year: Never true   Ran Out of Food in the Last Year: Never true  Transportation Needs: No Transportation Needs   Lack of Transportation (Medical): No   Lack of Transportation (Non-Medical): No  Physical Activity: Inactive   Days of Exercise per Week: 0 days   Minutes of Exercise per Session: 0 min  Stress: No Stress Concern Present   Feeling of Stress : Not at all  Social Connections: Unknown   Frequency of Communication with Friends and Family: Twice a week   Frequency of Social Gatherings with Friends and Family: Three times a week   Attends Religious Services: Never   Active Member of Clubs or Organizations: No   Attends Archivist Meetings: Never   Marital Status: Not on file  Tobacco Counseling Ready to quit: Not Answered Counseling given: Not Answered Tobacco comments: 5-6 a day. 03/06/2021   Clinical Intake:  Pre-visit preparation completed: Yes  Pain : No/denies pain     Nutritional Risks: None Diabetes: No  How often do you need to have someone help you when you read instructions, pamphlets, or other written materials from your doctor or pharmacy?: 1 - Never  Diabetic?no  Interpreter Needed?: No  Information entered by :: Leroy Kennedy LPN   Activities of Daily Living In your present state of health, do you have any difficulty performing the following activities: 11/26/2021  Hearing? Y   Vision? N  Difficulty concentrating or making decisions? N  Walking or climbing stairs? Y  Dressing or bathing? N  Doing errands, shopping? N  Preparing Food and eating ? N  Using the Toilet? N  In the past six months, have you accidently leaked urine? N  Do you have problems with loss of bowel control? N  Managing your Medications? N  Managing your Finances? N  Housekeeping or managing your Housekeeping? N  Some recent data might be hidden    Patient Care Team: Jon Billings, NP as PCP - General Kate Sable, MD as PCP - Cardiology (Cardiology)  Indicate any recent Medical Services you may have received from other than [facility] providers in the past year (date may be approximate).     Assessment:   This is a routine wellness examination for Ralph Leon.  Hearing/Vision screen Hearing Screening - Comments:: No hearing aids Some trouble hearing Vision Screening - Comments:: Not up to date No eye doctor  Dietary issues and exercise activities discussed: Current Exercise Habits: The patient does not participate in regular exercise at present, Intensity: Not Applicable, Exercise limited by: None identified   Goals Addressed             This Visit's Progress    Patient Stated       No goals       Depression Screen PHQ 2/9 Scores 11/26/2021 11/25/2020 02/19/2020  PHQ - 2 Score 0 0 1  PHQ- 9 Score - - 9    Fall Risk Fall Risk  11/25/2020 02/19/2020  Falls in the past year? 0 0  Number falls in past yr: - 0  Injury with Fall? - 0  Risk for fall due to : Impaired balance/gait;Medication side effect -  Follow up Falls evaluation completed;Education provided;Falls prevention discussed -    FALL RISK PREVENTION PERTAINING TO THE HOME:  Any stairs in or around the home? No  If so, are there any without handrails? No  Home free of loose throw rugs in walkways, pet beds, electrical cords, etc? Yes  Adequate lighting in your home to reduce risk of falls? Yes   ASSISTIVE  DEVICES UTILIZED TO PREVENT FALLS:  Life alert? No  Use of a cane, walker or w/c? Yes  Grab bars in the bathroom? Yes  Shower chair or bench in shower? Yes  Elevated toilet seat or a handicapped toilet? Yes   TIMED UP AND GO:  Was the test performed? No .    Cognitive Function:     6CIT Screen 11/26/2021 11/25/2020  What Year? 0 points 0 points  What month? 0 points 0 points  What time? 0 points 0 points  Count back from 20 0 points 2 points  Months in reverse 2 points 0 points  Repeat phrase 6 points 4 points  Total Score 8 6    Immunizations  There is no immunization history on file for this patient.  TDAP status: Due, Education has been provided regarding the importance of this vaccine. Advised may receive this vaccine at local pharmacy or Health Dept. Aware to provide a copy of the vaccination record if obtained from local pharmacy or Health Dept. Verbalized acceptance and understanding.  Flu Vaccine status: Declined, Education has been provided regarding the importance of this vaccine but patient still declined. Advised may receive this vaccine at local pharmacy or Health Dept. Aware to provide a copy of the vaccination record if obtained from local pharmacy or Health Dept. Verbalized acceptance and understanding.  Pneumococcal vaccine status: Declined,  Education has been provided regarding the importance of this vaccine but patient still declined. Advised may receive this vaccine at local pharmacy or Health Dept. Aware to provide a copy of the vaccination record if obtained from local pharmacy or Health Dept. Verbalized acceptance and understanding.   Covid-19 vaccine status: Declined, Education has been provided regarding the importance of this vaccine but patient still declined. Advised may receive this vaccine at local pharmacy or Health Dept.or vaccine clinic. Aware to provide a copy of the vaccination record if obtained from local pharmacy or Health Dept. Verbalized  acceptance and understanding.  Qualifies for Shingles Vaccine? Yes   Zostavax completed No   Shingrix Completed?: No.    Education has been provided regarding the importance of this vaccine. Patient has been advised to call insurance company to determine out of pocket expense if they have not yet received this vaccine. Advised may also receive vaccine at local pharmacy or Health Dept. Verbalized acceptance and understanding.  Screening Tests Health Maintenance  Topic Date Due   COVID-19 Vaccine (1) 12/12/2021 (Originally 08/19/1936)   Zoster Vaccines- Shingrix (1 of 2) 01/22/2022 (Originally 02/17/1988)   INFLUENZA VACCINE  02/20/2022 (Originally 06/23/2021)   Pneumonia Vaccine 72+ Years old (1 - PCV) 10/24/2022 (Originally 02/17/1942)   TETANUS/TDAP  11/26/2022 (Originally 02/16/1955)   HPV VACCINES  Aged Out    Health Maintenance  There are no preventive care reminders to display for this patient.   Colorectal cancer screening: No longer required.   Lung Cancer Screening: (Low Dose CT Chest recommended if Age 80-80 years, 30 pack-year currently smoking OR have quit w/in 15years.) qualify.   Lung Cancer Screening Referral:   Additional Screening:  Hepatitis C Screening: does not qualify;   Vision Screening: Recommended annual ophthalmology exams for early detection of glaucoma and other disorders of the eye. Is the patient up to date with their annual eye exam?  No  Who is the provider or what is the name of the office in which the patient attends annual eye exams?  If pt is not established with a provider, would they like to be referred to a provider to establish care? No .   Dental Screening: Recommended annual dental exams for proper oral hygiene  Community Resource Referral / Chronic Care Management: CRR required this visit?  No   CCM required this visit?  No      Plan:     I have personally reviewed and noted the following in the patients chart:   Medical and  social history Use of alcohol, tobacco or illicit drugs  Current medications and supplements including opioid prescriptions. Patient is not currently taking opioid prescriptions. Functional ability and status Nutritional status Physical activity Advanced directives List of other physicians Hospitalizations, surgeries, and ER visits in previous 12 months Vitals Screenings to include cognitive, depression, and  falls Referrals and appointments  In addition, I have reviewed and discussed with patient certain preventive protocols, quality metrics, and best practice recommendations. A written personalized care plan for preventive services as well as general preventive health recommendations were provided to patient.     Leroy Kennedy, LPN   [phone]   Nurse Notes:

## 2022-01-26 ENCOUNTER — Encounter: Payer: Self-pay | Admitting: Nurse Practitioner

## 2022-01-26 ENCOUNTER — Other Ambulatory Visit: Payer: Self-pay

## 2022-01-26 ENCOUNTER — Ambulatory Visit (INDEPENDENT_AMBULATORY_CARE_PROVIDER_SITE_OTHER): Payer: Medicare Other | Admitting: Nurse Practitioner

## 2022-01-26 VITALS — BP 128/63 | HR 69 | Temp 97.9°F | Wt 133.8 lb

## 2022-01-26 DIAGNOSIS — J449 Chronic obstructive pulmonary disease, unspecified: Secondary | ICD-10-CM | POA: Diagnosis not present

## 2022-01-26 DIAGNOSIS — E7800 Pure hypercholesterolemia, unspecified: Secondary | ICD-10-CM

## 2022-01-26 DIAGNOSIS — I10 Essential (primary) hypertension: Secondary | ICD-10-CM | POA: Diagnosis not present

## 2022-01-26 DIAGNOSIS — I639 Cerebral infarction, unspecified: Secondary | ICD-10-CM

## 2022-01-26 DIAGNOSIS — I700 Atherosclerosis of aorta: Secondary | ICD-10-CM

## 2022-01-26 DIAGNOSIS — R7301 Impaired fasting glucose: Secondary | ICD-10-CM

## 2022-01-26 DIAGNOSIS — E559 Vitamin D deficiency, unspecified: Secondary | ICD-10-CM

## 2022-01-26 MED ORDER — LISINOPRIL 20 MG PO TABS: 20.0000 mg | ORAL_TABLET | Freq: Every day | ORAL | 1 refills | Status: DC

## 2022-01-26 NOTE — Assessment & Plan Note (Signed)
Chronic.  Controlled without medication.  Labs ordered today.  Return to clinic in 3 months for reevaluation.  Call sooner if concerns arise.  ? ?

## 2022-01-26 NOTE — Assessment & Plan Note (Signed)
Labs ordered today.  Will make recommendations based on lab results. ?

## 2022-01-26 NOTE — Assessment & Plan Note (Signed)
Chronic.  Not well controlled.  Will increase Lisinopril to 20mg  daily.  Labs ordered today.  Return to clinic in 3 months for reevaluation.  Call sooner if concerns arise.  ? ?

## 2022-01-26 NOTE — Assessment & Plan Note (Addendum)
Chronic.  Controlled.  Continue with current medication regimen.  Labs ordered today.  Return to clinic in 3 months for reevaluation.  Call sooner if concerns arise.   

## 2022-01-26 NOTE — Progress Notes (Signed)
? ?BP 128/63 (BP Location: Left Arm, Cuff Size: Normal)   Pulse 69   Temp 97.9 ?F (36.6 ?C) (Oral)   Wt 133 lb 12.8 oz (60.7 kg)   SpO2 96%   BMI 22.97 kg/m?   ? ?Subjective:  ? ? Patient ID: Ralph Leon, male    DOB: November 02, 1936, 86 y.o.   MRN: [id number] ? ?HPI: ?Ralph Leon is a 86 y.o. male ? ?Chief Complaint  ?Patient presents with  ? Hypertension  ? ? ?HYPERTENSION ?Hypertension status: controlled  ?Satisfied with current treatment? no ?Duration of hypertension: years ?BP monitoring frequency:  daily ?BP range: 170/90 ?BP medication side effects:  no ?Medication compliance: excellent compliance ?Previous BP meds:lisinopril ?Aspirin: yes ?Recurrent headaches: no ?Visual changes: no ?Palpitations: no ?Dyspnea: no ?Chest pain: no ?Lower extremity edema: no ?Dizzy/lightheaded: no ? ? ?Patient finished physical therapy with home health.  He states it did help him some.  He is still having a lot of fatigue.  He hasn't fallen anymore.  He feels a little bit stronger than he was before.  Patient reports a vitamin D deficiency in the past.  ? ? ?Relevant past medical, surgical, family and social history reviewed and updated as indicated. Interim medical history since our last visit reviewed. ?Allergies and medications reviewed and updated. ? ?Review of Systems  ?Constitutional:  Positive for fatigue.  ?Eyes:  Negative for visual disturbance.  ?Respiratory:  Negative for shortness of breath.   ?Cardiovascular:  Negative for chest pain and leg swelling.  ?Neurological:  Negative for weakness, light-headedness and headaches.  ? ?Per HPI unless specifically indicated above ? ?   ?Objective:  ?  ?BP 128/63 (BP Location: Left Arm, Cuff Size: Normal)   Pulse 69   Temp 97.9 ?F (36.6 ?C) (Oral)   Wt 133 lb 12.8 oz (60.7 kg)   SpO2 96%   BMI 22.97 kg/m?   ?Wt Readings from Last 3 Encounters:  ?01/26/22 133 lb 12.8 oz (60.7 kg)  ?11/26/21 132 lb 9.6 oz (60.1 kg)  ?10/24/21 134 lb 9.6 oz (61.1 kg)  ?  ?Physical  Exam ?Vitals and nursing note reviewed.  ?Constitutional:   ?   General: He is not in acute distress. ?   Appearance: Normal appearance. He is not ill-appearing, toxic-appearing or diaphoretic.  ?HENT:  ?   Head: Normocephalic.  ?   Right Ear: External ear normal.  ?   Left Ear: External ear normal.  ?   Nose: Nose normal. No congestion or rhinorrhea.  ?   Mouth/Throat:  ?   Mouth: Mucous membranes are moist.  ?Eyes:  ?   General:     ?   Right eye: No discharge.     ?   Left eye: No discharge.  ?   Extraocular Movements: Extraocular movements intact.  ?   Conjunctiva/sclera: Conjunctivae normal.  ?   Pupils: Pupils are equal, round, and reactive to light.  ?Cardiovascular:  ?   Rate and Rhythm: Normal rate and regular rhythm.  ?   Heart sounds: No murmur heard. ?Pulmonary:  ?   Effort: Pulmonary effort is normal. No respiratory distress.  ?   Breath sounds: Normal breath sounds. No wheezing, rhonchi or rales.  ?Abdominal:  ?   General: Abdomen is flat. Bowel sounds are normal.  ?Musculoskeletal:  ?   Cervical back: Normal range of motion and neck supple.  ?   Comments: Uses walker.   ?Skin: ?   General: Skin is warm and dry.  ?  Capillary Refill: Capillary refill takes less than 2 seconds.  ?Neurological:  ?   General: No focal deficit present.  ?   Mental Status: He is alert and oriented to person, place, and time.  ?Psychiatric:     ?   Mood and Affect: Mood normal.     ?   Behavior: Behavior normal.     ?   Thought Content: Thought content normal.     ?   Judgment: Judgment normal.  ? ? ?Results for orders placed or performed in visit on 10/24/21  ?Comp Met (CMET)  ?Result Value Ref Range  ? Glucose 89 70 - 99 mg/dL  ? BUN 16 8 - 27 mg/dL  ? Creatinine, Ser 0.98 0.76 - 1.27 mg/dL  ? eGFR 77 >59 mL/min/1.73  ? BUN/Creatinine Ratio 16 10 - 24  ? Sodium 145 (H) 134 - 144 mmol/L  ? Potassium 4.6 3.5 - 5.2 mmol/L  ? Chloride 104 96 - 106 mmol/L  ? CO2 27 20 - 29 mmol/L  ? Calcium 9.4 8.6 - 10.2 mg/dL  ? Total  Protein 6.5 6.0 - 8.5 g/dL  ? Albumin 4.3 3.6 - 4.6 g/dL  ? Globulin, Total 2.2 1.5 - 4.5 g/dL  ? Albumin/Globulin Ratio 2.0 1.2 - 2.2  ? Bilirubin Total 0.5 0.0 - 1.2 mg/dL  ? Alkaline Phosphatase 78 44 - 121 IU/L  ? AST 9 0 - 40 IU/L  ? ALT 5 0 - 44 IU/L  ?HgB A1c  ?Result Value Ref Range  ? Hgb A1c MFr Bld 6.1 (H) 4.8 - 5.6 %  ? Est. average glucose Bld gHb Est-mCnc 128 mg/dL  ?Lipid Profile  ?Result Value Ref Range  ? Cholesterol, Total 212 (H) 100 - 199 mg/dL  ? Triglycerides 127 0 - 149 mg/dL  ? HDL 67 >39 mg/dL  ? VLDL Cholesterol Cal 22 5 - 40 mg/dL  ? LDL Chol Calc (NIH) 123 (H) 0 - 99 mg/dL  ? Chol/HDL Ratio 3.2 0.0 - 5.0 ratio  ?CBC w/Diff  ?Result Value Ref Range  ? WBC 8.1 3.4 - 10.8 x10E3/uL  ? RBC 4.97 4.14 - 5.80 x10E6/uL  ? Hemoglobin 14.2 13.0 - 17.7 g/dL  ? Hematocrit 43.5 37.5 - 51.0 %  ? MCV 88 79 - 97 fL  ? MCH 28.6 26.6 - 33.0 pg  ? MCHC 32.6 31.5 - 35.7 g/dL  ? RDW 14.0 11.6 - 15.4 %  ? Platelets 219 150 - 450 x10E3/uL  ? Neutrophils 70 Not Estab. %  ? Lymphs 19 Not Estab. %  ? Monocytes 8 Not Estab. %  ? Eos 3 Not Estab. %  ? Basos 0 Not Estab. %  ? Neutrophils Absolute 5.6 1.4 - 7.0 x10E3/uL  ? Lymphocytes Absolute 1.5 0.7 - 3.1 x10E3/uL  ? Monocytes Absolute 0.7 0.1 - 0.9 x10E3/uL  ? EOS (ABSOLUTE) 0.3 0.0 - 0.4 x10E3/uL  ? Basophils Absolute 0.0 0.0 - 0.2 x10E3/uL  ? Immature Granulocytes 0 Not Estab. %  ? Immature Grans (Abs) 0.0 0.0 - 0.1 x10E3/uL  ?RPR  ?Result Value Ref Range  ? RPR Ser Ql Non Reactive Non Reactive  ? ?   ?Assessment & Plan:  ? ?Problem List Items Addressed This Visit   ? ?  ? Cardiovascular and Mediastinum  ? Essential hypertension - Primary  ?  Chronic.  Not well controlled.  Will increase Lisinopril to 60m daily.  Labs ordered today.  Return to clinic in 3 months for reevaluation.  Call sooner if concerns arise.  ? ?  ?  ? Relevant Medications  ? lisinopril (ZESTRIL) 20 MG tablet  ? Other Relevant Orders  ? Comp Met (CMET)  ? Aortic atherosclerosis ([place])  ?   Chronic.  Controlled without medication.  Labs ordered today.  Return to clinic in 3 months for reevaluation.  Call sooner if concerns arise.  ? ?  ?  ? Relevant Medications  ? lisinopril (ZESTRIL) 20 MG tablet  ? Other Relevant Orders  ? Comp Met (CMET)  ?  ? Respiratory  ? COPD (chronic obstructive pulmonary disease) ([place])  ?  Chronic.  Controlled.  Continue with current medication regimen.  Labs ordered today.  Return to clinic in 3 months for reevaluation.  Call sooner if concerns arise.  ? ?  ?  ? Relevant Orders  ? Comp Met (CMET)  ?  ? Endocrine  ? IFG (impaired fasting glucose)  ?  Labs ordered today. Will make recommendations based on lab results. ?  ?  ? Relevant Orders  ? Comp Met (CMET)  ? HgB A1c  ?  ? Other  ? Hypercholesteremia  ?  Chronic.  Controlled without medication. Labs ordered today.  Return to clinic in 3 months for reevaluation.  Call sooner if concerns arise.  ? ?  ?  ? Relevant Medications  ? lisinopril (ZESTRIL) 20 MG tablet  ? ?Other Visit Diagnoses   ? ? COPD with chronic bronchitis and emphysema ([place])   (Chronic)    ? Relevant Orders  ? CBC w/Diff  ? Vitamin D deficiency      ? Relevant Orders  ? Vitamin D (25 hydroxy)  ? ?  ?  ? ?Follow up plan: ?Return in about 3 months (around 04/28/2022). ? ? ? ? ? ? ?

## 2022-01-27 LAB — CBC WITH DIFFERENTIAL/PLATELET
Basophils Absolute: 0 10*3/uL (ref 0.0–0.2)
Basos: 1 %
EOS (ABSOLUTE): 0.2 10*3/uL (ref 0.0–0.4)
Eos: 3 %
Hematocrit: 44.5 % (ref 37.5–51.0)
Hemoglobin: 14.2 g/dL (ref 13.0–17.7)
Immature Grans (Abs): 0 10*3/uL (ref 0.0–0.1)
Immature Granulocytes: 0 %
Lymphocytes Absolute: 1.6 10*3/uL (ref 0.7–3.1)
Lymphs: 20 %
MCH: 28.3 pg (ref 26.6–33.0)
MCHC: 31.9 g/dL (ref 31.5–35.7)
MCV: 89 fL (ref 79–97)
Monocytes Absolute: 0.6 10*3/uL (ref 0.1–0.9)
Monocytes: 8 %
Neutrophils Absolute: 5.2 10*3/uL (ref 1.4–7.0)
Neutrophils: 68 %
Platelets: 222 10*3/uL (ref 150–450)
RBC: 5.01 x10E6/uL (ref 4.14–5.80)
RDW: 14 % (ref 11.6–15.4)
WBC: 7.7 10*3/uL (ref 3.4–10.8)

## 2022-01-27 LAB — COMPREHENSIVE METABOLIC PANEL
ALT: 7 IU/L (ref 0–44)
AST: 10 IU/L (ref 0–40)
Albumin/Globulin Ratio: 1.9 (ref 1.2–2.2)
Albumin: 4.5 g/dL (ref 3.6–4.6)
Alkaline Phosphatase: 80 IU/L (ref 44–121)
BUN/Creatinine Ratio: 19 (ref 10–24)
BUN: 19 mg/dL (ref 8–27)
Bilirubin Total: 0.4 mg/dL (ref 0.0–1.2)
CO2: 25 mmol/L (ref 20–29)
Calcium: 9.6 mg/dL (ref 8.6–10.2)
Chloride: 101 mmol/L (ref 96–106)
Creatinine, Ser: 1.02 mg/dL (ref 0.76–1.27)
Globulin, Total: 2.4 g/dL (ref 1.5–4.5)
Glucose: 120 mg/dL — ABNORMAL HIGH (ref 70–99)
Potassium: 4.5 mmol/L (ref 3.5–5.2)
Sodium: 142 mmol/L (ref 134–144)
Total Protein: 6.9 g/dL (ref 6.0–8.5)
eGFR: 73 mL/min/{1.73_m2} (ref >59)

## 2022-01-27 LAB — VITAMIN D 25 HYDROXY (VIT D DEFICIENCY, FRACTURES): Vit D, 25-Hydroxy: 29 ng/mL — ABNORMAL LOW (ref 30.0–100.0)

## 2022-01-27 LAB — HEMOGLOBIN A1C
Est. average glucose Bld gHb Est-mCnc: 126 mg/dL
Hgb A1c MFr Bld: 6 % — ABNORMAL HIGH (ref 4.8–5.6)

## 2022-01-27 NOTE — Progress Notes (Signed)
Please let patient know that his lab work looks good.  His vitamin D is low and could be contributing to his fatigue.  I recommend he start Vitamin D 1000 IU daily to see if this helps his symptoms.  Otherwise his blood work looks good.  No concerns at this time.

## 2022-04-27 ENCOUNTER — Encounter: Payer: Self-pay | Admitting: Nurse Practitioner

## 2022-04-27 ENCOUNTER — Ambulatory Visit (INDEPENDENT_AMBULATORY_CARE_PROVIDER_SITE_OTHER): Payer: Medicare Other | Admitting: Nurse Practitioner

## 2022-04-27 VITALS — BP 134/78 | HR 81 | Temp 98.2°F | Ht 63.9 in | Wt 134.2 lb

## 2022-04-27 DIAGNOSIS — J449 Chronic obstructive pulmonary disease, unspecified: Secondary | ICD-10-CM

## 2022-04-27 DIAGNOSIS — I700 Atherosclerosis of aorta: Secondary | ICD-10-CM

## 2022-04-27 DIAGNOSIS — Z8673 Personal history of transient ischemic attack (TIA), and cerebral infarction without residual deficits: Secondary | ICD-10-CM

## 2022-04-27 DIAGNOSIS — R7301 Impaired fasting glucose: Secondary | ICD-10-CM

## 2022-04-27 DIAGNOSIS — I10 Essential (primary) hypertension: Secondary | ICD-10-CM

## 2022-04-27 DIAGNOSIS — E7800 Pure hypercholesterolemia, unspecified: Secondary | ICD-10-CM

## 2022-04-27 DIAGNOSIS — I639 Cerebral infarction, unspecified: Secondary | ICD-10-CM

## 2022-04-27 DIAGNOSIS — R630 Anorexia: Secondary | ICD-10-CM

## 2022-04-27 MED ORDER — MIRTAZAPINE 15 MG PO TABS: 15.0000 mg | ORAL_TABLET | Freq: Every day | ORAL | 0 refills | Status: DC

## 2022-04-27 NOTE — Assessment & Plan Note (Signed)
Chronic.  Controlled.  Continue with current medication regimen of Lisinpril 20mg  daily.  Labs ordered today.  Return to clinic in 3 months for reevaluation.  Call sooner if concerns arise.

## 2022-04-27 NOTE — Assessment & Plan Note (Signed)
Chronic.  Controlled without medication.  Labs ordered today.  Return to clinic in 3 months for reevaluation.  Call sooner if concerns arise.  ? ?

## 2022-04-27 NOTE — Assessment & Plan Note (Signed)
Labs ordered today.  Will make recommendations based on lab results. ?

## 2022-04-27 NOTE — Progress Notes (Signed)
BP 134/78   Pulse 81   Temp 98.2 F (36.8 C) (Oral)   Ht 5' 3.9" (1.623 m)   Wt 134 lb 3.2 oz (60.9 kg)   SpO2 97%   BMI 23.11 kg/m    Subjective:    Patient ID: Ralph Leon, male    DOB: July 17, 1936, 86 y.o.   MRN: [id number]  HPI: Ralph Leon is a 86 y.o. male  Chief Complaint  Patient presents with   Hypertension   Hyperlipidemia   IFG    HYPERTENSION Hypertension status: controlled  Satisfied with current treatment? no Duration of hypertension: years BP monitoring frequency:  daily BP range: 120-130/80 BP medication side effects:  no Medication compliance: excellent compliance Previous BP meds:lisinopril Aspirin: yes Recurrent headaches: no Visual changes: no Palpitations: no Dyspnea: no Chest pain: no Lower extremity edema: no Dizzy/lightheaded: no    He states it did help him some.  He is still having a lot of fatigue.  He hasn't fallen anymore.  He feels a little bit stronger than he was before.  Patient states he has trouble eating at times.  He feels like when he starts eating he doesn't feel hungry anymore.     Relevant past medical, surgical, family and social history reviewed and updated as indicated. Interim medical history since our last visit reviewed. Allergies and medications reviewed and updated.  Review of Systems  Constitutional:  Positive for appetite change and fatigue.  Eyes:  Negative for visual disturbance.  Respiratory:  Negative for shortness of breath.   Cardiovascular:  Negative for chest pain and leg swelling.  Neurological:  Negative for weakness, light-headedness and headaches.   Per HPI unless specifically indicated above     Objective:    BP 134/78   Pulse 81   Temp 98.2 F (36.8 C) (Oral)   Ht 5' 3.9" (1.623 m)   Wt 134 lb 3.2 oz (60.9 kg)   SpO2 97%   BMI 23.11 kg/m   Wt Readings from Last 3 Encounters:  04/27/22 134 lb 3.2 oz (60.9 kg)  01/26/22 133 lb 12.8 oz (60.7 kg)  11/26/21 132 lb 9.6 oz (60.1  kg)    Physical Exam Vitals and nursing note reviewed.  Constitutional:      General: He is not in acute distress.    Appearance: Normal appearance. He is not ill-appearing, toxic-appearing or diaphoretic.  HENT:     Head: Normocephalic.     Right Ear: External ear normal.     Left Ear: External ear normal.     Nose: Nose normal. No congestion or rhinorrhea.     Mouth/Throat:     Mouth: Mucous membranes are moist.  Eyes:     General:        Right eye: No discharge.        Left eye: No discharge.     Extraocular Movements: Extraocular movements intact.     Conjunctiva/sclera: Conjunctivae normal.     Pupils: Pupils are equal, round, and reactive to light.  Cardiovascular:     Rate and Rhythm: Normal rate and regular rhythm.     Heart sounds: No murmur heard. Pulmonary:     Effort: Pulmonary effort is normal. No respiratory distress.     Breath sounds: Normal breath sounds. No wheezing, rhonchi or rales.  Abdominal:     General: Abdomen is flat. Bowel sounds are normal.  Musculoskeletal:     Cervical back: Normal range of motion and neck supple.  Comments: Uses walker.   Skin:    General: Skin is warm and dry.     Capillary Refill: Capillary refill takes less than 2 seconds.  Neurological:     General: No focal deficit present.     Mental Status: He is alert and oriented to person, place, and time.  Psychiatric:        Mood and Affect: Mood normal.        Behavior: Behavior normal.        Thought Content: Thought content normal.        Judgment: Judgment normal.    Results for orders placed or performed in visit on 01/26/22  Comp Met (CMET)  Result Value Ref Range   Glucose 120 (H) 70 - 99 mg/dL   BUN 19 8 - 27 mg/dL   Creatinine, Ser 1.02 0.76 - 1.27 mg/dL   eGFR 73 >59 mL/min/1.73   BUN/Creatinine Ratio 19 10 - 24   Sodium 142 134 - 144 mmol/L   Potassium 4.5 3.5 - 5.2 mmol/L   Chloride 101 96 - 106 mmol/L   CO2 25 20 - 29 mmol/L   Calcium 9.6 8.6 - 10.2  mg/dL   Total Protein 6.9 6.0 - 8.5 g/dL   Albumin 4.5 3.6 - 4.6 g/dL   Globulin, Total 2.4 1.5 - 4.5 g/dL   Albumin/Globulin Ratio 1.9 1.2 - 2.2   Bilirubin Total 0.4 0.0 - 1.2 mg/dL   Alkaline Phosphatase 80 44 - 121 IU/L   AST 10 0 - 40 IU/L   ALT 7 0 - 44 IU/L  HgB A1c  Result Value Ref Range   Hgb A1c MFr Bld 6.0 (H) 4.8 - 5.6 %   Est. average glucose Bld gHb Est-mCnc 126 mg/dL  CBC w/Diff  Result Value Ref Range   WBC 7.7 3.4 - 10.8 x10E3/uL   RBC 5.01 4.14 - 5.80 x10E6/uL   Hemoglobin 14.2 13.0 - 17.7 g/dL   Hematocrit 44.5 37.5 - 51.0 %   MCV 89 79 - 97 fL   MCH 28.3 26.6 - 33.0 pg   MCHC 31.9 31.5 - 35.7 g/dL   RDW 14.0 11.6 - 15.4 %   Platelets 222 150 - 450 x10E3/uL   Neutrophils 68 Not Estab. %   Lymphs 20 Not Estab. %   Monocytes 8 Not Estab. %   Eos 3 Not Estab. %   Basos 1 Not Estab. %   Neutrophils Absolute 5.2 1.4 - 7.0 x10E3/uL   Lymphocytes Absolute 1.6 0.7 - 3.1 x10E3/uL   Monocytes Absolute 0.6 0.1 - 0.9 x10E3/uL   EOS (ABSOLUTE) 0.2 0.0 - 0.4 x10E3/uL   Basophils Absolute 0.0 0.0 - 0.2 x10E3/uL   Immature Granulocytes 0 Not Estab. %   Immature Grans (Abs) 0.0 0.0 - 0.1 x10E3/uL  Vitamin D (25 hydroxy)  Result Value Ref Range   Vit D, 25-Hydroxy 29.0 (L) 30.0 - 100.0 ng/mL      Assessment & Plan:   Problem List Items Addressed This Visit       Cardiovascular and Mediastinum   Essential hypertension - Primary    Chronic.  Controlled.  Continue with current medication regimen of Lisinpril 31m daily.  Labs ordered today.  Return to clinic in 3 months for reevaluation.  Call sooner if concerns arise.         Relevant Orders   Comp Met (CMET)   Aortic atherosclerosis ([facility])    Chronic.  Controlled without medication.  Labs ordered today.  Return to clinic in 3 months for reevaluation.  Call sooner if concerns arise.           Respiratory   COPD (chronic obstructive pulmonary disease) ([facility])    Chronic.  Controlled.  Continue with current  medication regimen.  Labs ordered today.  Return to clinic in 3 months for reevaluation.  Call sooner if concerns arise.           Endocrine   IFG (impaired fasting glucose)    Labs ordered today. Will make recommendations based on lab results.        Relevant Orders   HgB A1c     Other   History of CVA (cerebrovascular accident)    Patient continues to decline referral to Neurology.  Feels like he is doing okay with out it.       Hypercholesteremia    Chronic.  Controlled without medication. Labs ordered today.  Return to clinic in 3 months for reevaluation.  Call sooner if concerns arise.         Relevant Orders   Lipid Profile   Other Visit Diagnoses     Decreased appetite       Will prescribe mirtazepine to see if appetite increases. Hopefully eating more will give patient more energy.  Follow up in 2 months.         Follow up plan: Return in about 2 months (around 06/27/2022) for HTN, HLD, DM2 FU.

## 2022-04-27 NOTE — Assessment & Plan Note (Signed)
Chronic.  Controlled.  Continue with current medication regimen.  Labs ordered today.  Return to clinic in 3 months for reevaluation.  Call sooner if concerns arise.   

## 2022-04-27 NOTE — Assessment & Plan Note (Signed)
Patient continues to decline referral to Neurology.  Feels like he is doing okay with out it.

## 2022-04-28 LAB — COMPREHENSIVE METABOLIC PANEL
ALT: 6 IU/L (ref 0–44)
AST: 8 IU/L (ref 0–40)
Albumin/Globulin Ratio: 1.8 (ref 1.2–2.2)
Albumin: 4.2 g/dL (ref 3.6–4.6)
Alkaline Phosphatase: 75 IU/L (ref 44–121)
BUN/Creatinine Ratio: 15 (ref 10–24)
BUN: 17 mg/dL (ref 8–27)
Bilirubin Total: 0.4 mg/dL (ref 0.0–1.2)
CO2: 26 mmol/L (ref 20–29)
Calcium: 9.2 mg/dL (ref 8.6–10.2)
Chloride: 104 mmol/L (ref 96–106)
Creatinine, Ser: 1.1 mg/dL (ref 0.76–1.27)
Globulin, Total: 2.4 g/dL (ref 1.5–4.5)
Glucose: 93 mg/dL (ref 70–99)
Potassium: 4.5 mmol/L (ref 3.5–5.2)
Sodium: 142 mmol/L (ref 134–144)
Total Protein: 6.6 g/dL (ref 6.0–8.5)
eGFR: 66 mL/min/{1.73_m2} (ref >59)

## 2022-04-28 LAB — LIPID PANEL
Chol/HDL Ratio: 3.8 ratio (ref 0.0–5.0)
Cholesterol, Total: 241 mg/dL — ABNORMAL HIGH (ref 100–199)
HDL: 64 mg/dL (ref >39)
LDL Chol Calc (NIH): 155 mg/dL — ABNORMAL HIGH (ref 0–99)
Triglycerides: 127 mg/dL (ref 0–149)
VLDL Cholesterol Cal: 22 mg/dL (ref 5–40)

## 2022-04-28 LAB — HEMOGLOBIN A1C
Est. average glucose Bld gHb Est-mCnc: 126 mg/dL
Hgb A1c MFr Bld: 6 % — ABNORMAL HIGH (ref 4.8–5.6)

## 2022-04-28 NOTE — Progress Notes (Signed)
Please let patient know that his lab work looks good.  Continue with current medication regimen.  No further concerns at this time.  Follow up as discussed.

## 2022-06-29 ENCOUNTER — Ambulatory Visit (INDEPENDENT_AMBULATORY_CARE_PROVIDER_SITE_OTHER): Payer: Medicare Other | Admitting: Nurse Practitioner

## 2022-06-29 ENCOUNTER — Encounter: Payer: Self-pay | Admitting: Nurse Practitioner

## 2022-06-29 VITALS — BP 152/89 | HR 86 | Temp 98.4°F | Ht 63.9 in | Wt 135.7 lb

## 2022-06-29 DIAGNOSIS — I10 Essential (primary) hypertension: Secondary | ICD-10-CM

## 2022-06-29 DIAGNOSIS — I700 Atherosclerosis of aorta: Secondary | ICD-10-CM

## 2022-06-29 DIAGNOSIS — R5383 Other fatigue: Secondary | ICD-10-CM | POA: Diagnosis not present

## 2022-06-29 DIAGNOSIS — J449 Chronic obstructive pulmonary disease, unspecified: Secondary | ICD-10-CM | POA: Diagnosis not present

## 2022-06-29 DIAGNOSIS — E7800 Pure hypercholesterolemia, unspecified: Secondary | ICD-10-CM

## 2022-06-29 DIAGNOSIS — I2510 Atherosclerotic heart disease of native coronary artery without angina pectoris: Secondary | ICD-10-CM

## 2022-06-29 MED ORDER — CYANOCOBALAMIN 1000 MCG/ML IJ SOLN
1000.0000 ug | INTRAMUSCULAR | Status: AC
  Administered 2022-07-13: 1000 ug via INTRAMUSCULAR

## 2022-06-29 MED ORDER — CYANOCOBALAMIN 1000 MCG/ML IJ SOLN
1000.0000 ug | Freq: Once | INTRAMUSCULAR | Status: AC
  Administered 2022-06-29: 1000 ug via INTRAMUSCULAR

## 2022-06-29 NOTE — Assessment & Plan Note (Signed)
Chronic.  Controlled without medication.  Return to clinic in 2 months for reevaluation.  Call sooner if concerns arise.

## 2022-06-29 NOTE — Assessment & Plan Note (Addendum)
Chronic.  Controlled.  Continue with current medication regimen of Lisinpril 20mg  daily.  Return to clinic in 2 months for reevaluation.  Call sooner if concerns arise.

## 2022-06-29 NOTE — Assessment & Plan Note (Signed)
Chronic.  Controlled.  Continue with current medication regimen.  Return to clinic in 3 months for reevaluation.  Call sooner if concerns arise.  

## 2022-06-29 NOTE — Assessment & Plan Note (Signed)
Chronic.  Controlled without medication.  Return to clinic in 3 months for reevaluation.  Call sooner if concerns arise.   

## 2022-06-29 NOTE — Progress Notes (Signed)
BP (!) 152/89   Pulse 86   Temp 98.4 F (36.9 C) (Oral)   Ht 5' 3.9" (1.623 m)   Wt 135 lb 11.2 oz (61.6 kg)   SpO2 98%   BMI 23.37 kg/m    Subjective:    Patient ID: Ralph Leon, male    DOB: 06/09/1936, 86 y.o.   MRN: [id number]  HPI: Ralph Leon is a 86 y.o. male  Chief Complaint  Patient presents with   Hypertension   Hyperlipidemia   Diabetes   Fatigue    Seems like it gets worse every day, has felt fatigued for a while    HYPERTENSION Hypertension status: controlled  Satisfied with current treatment? no Duration of hypertension: years BP monitoring frequency:  daily BP range: 170/100 BP medication side effects:  no Medication compliance: excellent compliance Previous BP meds:none- was taken off medication after several falls. Has noticed blood pressure is increasing again.  Has a lot of fatigue and not a lot of energy.  Aspirin: yes Recurrent headaches: no Visual changes: no Palpitations: no Dyspnea: no Chest pain: no Lower extremity edema: no Dizzy/lightheaded: no  COPD COPD status: controlled Satisfied with current treatment?: yes Oxygen use: no Dyspnea frequency:  Cough frequency:  Rescue inhaler frequency:   Limitation of activity: no Productive cough:  Last Spirometry:  Pneumovax: Not up to Date Influenza: Not up to Date  Patient states that he is tired all of the time.  Does not know why. He states nothing has changed.  He has trouble getting to sleep and then not sure how much he sleeps at night.  He has trouble getting out of bed when it is time.  Patient states he is not taking the Mirtazipine because it was making him feel sick to his stomach.  He didn't notice a difference in his sleep or appetite when he was taking it. Made him feels nauseous.    Relevant past medical, surgical, family and social history reviewed and updated as indicated. Interim medical history since our last visit reviewed. Allergies and medications reviewed and  updated.  Review of Systems  Constitutional:  Positive for fatigue.  Eyes:  Negative for visual disturbance.  Respiratory:  Negative for shortness of breath.   Cardiovascular:  Negative for chest pain and leg swelling.  Neurological:  Positive for tremors and weakness. Negative for light-headedness and headaches.    Per HPI unless specifically indicated above     Objective:    BP (!) 152/89   Pulse 86   Temp 98.4 F (36.9 C) (Oral)   Ht 5' 3.9" (1.623 m)   Wt 135 lb 11.2 oz (61.6 kg)   SpO2 98%   BMI 23.37 kg/m   Wt Readings from Last 3 Encounters:  06/29/22 135 lb 11.2 oz (61.6 kg)  04/27/22 134 lb 3.2 oz (60.9 kg)  01/26/22 133 lb 12.8 oz (60.7 kg)    Physical Exam Vitals and nursing note reviewed.  Constitutional:      General: He is not in acute distress.    Appearance: Normal appearance. He is not ill-appearing, toxic-appearing or diaphoretic.  HENT:     Head: Normocephalic.     Right Ear: External ear normal.     Left Ear: External ear normal.     Nose: Nose normal. No congestion or rhinorrhea.     Mouth/Throat:     Mouth: Mucous membranes are moist.  Eyes:     General:        Right eye:  No discharge.        Left eye: No discharge.     Extraocular Movements: Extraocular movements intact.     Conjunctiva/sclera: Conjunctivae normal.     Pupils: Pupils are equal, round, and reactive to light.  Cardiovascular:     Rate and Rhythm: Normal rate and regular rhythm.     Heart sounds: No murmur heard. Pulmonary:     Effort: Pulmonary effort is normal. No respiratory distress.     Breath sounds: Normal breath sounds. No wheezing, rhonchi or rales.  Abdominal:     General: Abdomen is flat. Bowel sounds are normal.  Musculoskeletal:     Cervical back: Normal range of motion and neck supple.     Comments: Uses walker.   Skin:    General: Skin is warm and dry.     Capillary Refill: Capillary refill takes less than 2 seconds.  Neurological:     General: No focal  deficit present.     Mental Status: He is alert and oriented to person, place, and time.  Psychiatric:        Mood and Affect: Mood normal.        Behavior: Behavior normal.        Thought Content: Thought content normal.        Judgment: Judgment normal.     Results for orders placed or performed in visit on 04/27/22  Comp Met (CMET)  Result Value Ref Range   Glucose 93 70 - 99 mg/dL   BUN 17 8 - 27 mg/dL   Creatinine, Ser 1.10 0.76 - 1.27 mg/dL   eGFR 66 >59 mL/min/1.73   BUN/Creatinine Ratio 15 10 - 24   Sodium 142 134 - 144 mmol/L   Potassium 4.5 3.5 - 5.2 mmol/L   Chloride 104 96 - 106 mmol/L   CO2 26 20 - 29 mmol/L   Calcium 9.2 8.6 - 10.2 mg/dL   Total Protein 6.6 6.0 - 8.5 g/dL   Albumin 4.2 3.6 - 4.6 g/dL   Globulin, Total 2.4 1.5 - 4.5 g/dL   Albumin/Globulin Ratio 1.8 1.2 - 2.2   Bilirubin Total 0.4 0.0 - 1.2 mg/dL   Alkaline Phosphatase 75 44 - 121 IU/L   AST 8 0 - 40 IU/L   ALT 6 0 - 44 IU/L  Lipid Profile  Result Value Ref Range   Cholesterol, Total 241 (H) 100 - 199 mg/dL   Triglycerides 127 0 - 149 mg/dL   HDL 64 >39 mg/dL   VLDL Cholesterol Cal 22 5 - 40 mg/dL   LDL Chol Calc (NIH) 155 (H) 0 - 99 mg/dL   Chol/HDL Ratio 3.8 0.0 - 5.0 ratio  HgB A1c  Result Value Ref Range   Hgb A1c MFr Bld 6.0 (H) 4.8 - 5.6 %   Est. average glucose Bld gHb Est-mCnc 126 mg/dL      Assessment & Plan:   Problem List Items Addressed This Visit       Cardiovascular and Mediastinum   Essential hypertension - Primary    Chronic.  Controlled.  Continue with current medication regimen of Lisinpril 61m daily.  Return to clinic in 2 months for reevaluation.  Call sooner if concerns arise.        Coronary artery disease    Chronic.  Controlled without medication.  Return to clinic in 2 months for reevaluation.  Call sooner if concerns arise.        Aortic atherosclerosis ([place]     Respiratory  COPD (chronic obstructive pulmonary disease) ([facility])    Chronic.   Controlled.  Continue with current medication regimen.  Return to clinic in 3 months for reevaluation.  Call sooner if concerns arise.          Other   Hypercholesteremia    Chronic.  Controlled without medication.  Return to clinic in 3 months for reevaluation.  Call sooner if concerns arise.        Other Visit Diagnoses     Other fatigue       Will give B12 injections every 2 weeks for 2 months. Follow up in 2 months to see if symptoms are improved.   Relevant Medications   cyanocobalamin (VITAMIN B12) injection 1,000 mcg   cyanocobalamin (VITAMIN B12) injection 1,000 mcg (Start on 07/13/2022 12:00 AM)        Follow up plan: Return in about 2 months (around 08/29/2022) for Fatigue.

## 2022-07-13 ENCOUNTER — Ambulatory Visit (INDEPENDENT_AMBULATORY_CARE_PROVIDER_SITE_OTHER): Payer: Medicare Other

## 2022-07-13 DIAGNOSIS — R5383 Other fatigue: Secondary | ICD-10-CM

## 2022-07-13 DIAGNOSIS — R5382 Chronic fatigue, unspecified: Secondary | ICD-10-CM | POA: Diagnosis not present

## 2022-07-30 ENCOUNTER — Other Ambulatory Visit: Payer: Self-pay | Admitting: Nurse Practitioner

## 2022-07-31 NOTE — Telephone Encounter (Signed)
Requested Prescriptions  Pending Prescriptions Disp Refills  . lisinopril (ZESTRIL) 20 MG tablet [Pharmacy Med Name: Lisinopril 20 MG Oral Tablet] 90 tablet 0    Sig: Take 1 tablet by mouth once daily     Cardiovascular:  ACE Inhibitors Failed - 07/30/2022 12:13 PM      Failed - Last BP in normal range    BP Readings from Last 1 Encounters:  06/29/22 (!) 152/89         Passed - Cr in normal range and within 180 days    Creatinine, Ser  Date Value Ref Range Status  04/27/2022 1.10 0.76 - 1.27 mg/dL Final         Passed - K in normal range and within 180 days    Potassium  Date Value Ref Range Status  04/27/2022 4.5 3.5 - 5.2 mmol/L Final         Passed - Patient is not pregnant      Passed - Valid encounter within last 6 months    Recent Outpatient Visits          1 month ago Essential hypertension   [facility] Larae Grooms, NP   3 months ago Essential hypertension   [facility] Larae Grooms, NP   6 months ago Essential hypertension   [facility] Larae Grooms, NP   8 months ago Essential hypertension   [facility] Larae Grooms, NP   9 months ago Aortic atherosclerosis (H[facility])   [facility] Larae Grooms, NP      Future Appointments            In 1 week Agbor-Etang, Arlys John, MD [facility] [place]   In 1 month Larae Grooms, NP [facility]

## 2022-08-07 ENCOUNTER — Ambulatory Visit: Payer: Medicare Other | Admitting: Cardiology

## 2022-08-31 ENCOUNTER — Encounter: Payer: Self-pay | Admitting: Nurse Practitioner

## 2022-08-31 ENCOUNTER — Ambulatory Visit (INDEPENDENT_AMBULATORY_CARE_PROVIDER_SITE_OTHER): Payer: Medicare Other | Admitting: Nurse Practitioner

## 2022-08-31 VITALS — BP 150/88 | HR 88 | Temp 97.8°F | Wt 138.8 lb

## 2022-08-31 DIAGNOSIS — I2510 Atherosclerotic heart disease of native coronary artery without angina pectoris: Secondary | ICD-10-CM

## 2022-08-31 DIAGNOSIS — R5383 Other fatigue: Secondary | ICD-10-CM | POA: Insufficient documentation

## 2022-08-31 MED ORDER — MIRTAZAPINE 7.5 MG PO TABS: 7.5000 mg | ORAL_TABLET | Freq: Every day | ORAL | 1 refills | Status: DC

## 2022-08-31 NOTE — Progress Notes (Signed)
BP (!) 150/88   Pulse 88   Temp 97.8 F (36.6 C) (Oral)   Wt 138 lb 12.8 oz (63 kg)   SpO2 98%   BMI 23.90 kg/m    Subjective:    Patient ID: Ralph Leon, male    DOB: 07-02-1936, 86 y.o.   MRN: [id number]  HPI: [name] is a 86 y.o. male  Chief Complaint  Patient presents with   Fatigue         Patient states that he is tired all of the time.  States the B12 shots didn't really help him with his fatigue.  States he eats a good breakfast in the morning but doesn't eat much throughout the day.  States he doesn't sleep well and has trouble getting up in the mornings.    Relevant past medical, surgical, family and social history reviewed and updated as indicated. Interim medical history since our last visit reviewed. Allergies and medications reviewed and updated.  Review of Systems  Constitutional:  Positive for fatigue.  Eyes:  Negative for visual disturbance.  Respiratory:  Negative for shortness of breath.   Cardiovascular:  Negative for chest pain and leg swelling.  Neurological:  Positive for weakness. Negative for light-headedness and headaches.    Per HPI unless specifically indicated above     Objective:    BP (!) 150/88   Pulse 88   Temp 97.8 F (36.6 C) (Oral)   Wt 138 lb 12.8 oz (63 kg)   SpO2 98%   BMI 23.90 kg/m   Wt Readings from Last 3 Encounters:  08/31/22 138 lb 12.8 oz (63 kg)  06/29/22 135 lb 11.2 oz (61.6 kg)  04/27/22 134 lb 3.2 oz (60.9 kg)    Physical Exam Vitals and nursing note reviewed.  Constitutional:      General: He is not in acute distress.    Appearance: Normal appearance. He is not ill-appearing, toxic-appearing or diaphoretic.  HENT:     Head: Normocephalic.     Right Ear: External ear normal.     Left Ear: External ear normal.     Nose: Nose normal. No congestion or rhinorrhea.     Mouth/Throat:     Mouth: Mucous membranes are moist.  Eyes:     General:        Right eye: No discharge.        Left eye: No  discharge.     Extraocular Movements: Extraocular movements intact.     Conjunctiva/sclera: Conjunctivae normal.     Pupils: Pupils are equal, round, and reactive to light.  Cardiovascular:     Rate and Rhythm: Normal rate and regular rhythm.     Heart sounds: No murmur heard. Pulmonary:     Effort: Pulmonary effort is normal. No respiratory distress.     Breath sounds: Normal breath sounds. No wheezing, rhonchi or rales.  Abdominal:     General: Abdomen is flat. Bowel sounds are normal.  Musculoskeletal:     Cervical back: Normal range of motion and neck supple.     Comments: Uses walker.   Skin:    General: Skin is warm and dry.     Capillary Refill: Capillary refill takes less than 2 seconds.  Neurological:     General: No focal deficit present.     Mental Status: He is alert and oriented to person, place, and time.  Psychiatric:        Mood and Affect: Mood normal.  Behavior: Behavior normal.        Thought Content: Thought content normal.        Judgment: Judgment normal.     Results for orders placed or performed in visit on 04/27/22  Comp Met (CMET)  Result Value Ref Range   Glucose 93 70 - 99 mg/dL   BUN 17 8 - 27 mg/dL   Creatinine, Ser 1.10 0.76 - 1.27 mg/dL   eGFR 66 >59 mL/min/1.73   BUN/Creatinine Ratio 15 10 - 24   Sodium 142 134 - 144 mmol/L   Potassium 4.5 3.5 - 5.2 mmol/L   Chloride 104 96 - 106 mmol/L   CO2 26 20 - 29 mmol/L   Calcium 9.2 8.6 - 10.2 mg/dL   Total Protein 6.6 6.0 - 8.5 g/dL   Albumin 4.2 3.6 - 4.6 g/dL   Globulin, Total 2.4 1.5 - 4.5 g/dL   Albumin/Globulin Ratio 1.8 1.2 - 2.2   Bilirubin Total 0.4 0.0 - 1.2 mg/dL   Alkaline Phosphatase 75 44 - 121 IU/L   AST 8 0 - 40 IU/L   ALT 6 0 - 44 IU/L  Lipid Profile  Result Value Ref Range   Cholesterol, Total 241 (H) 100 - 199 mg/dL   Triglycerides 127 0 - 149 mg/dL   HDL 64 >39 mg/dL   VLDL Cholesterol Cal 22 5 - 40 mg/dL   LDL Chol Calc (NIH) 155 (H) 0 - 99 mg/dL   Chol/HDL  Ratio 3.8 0.0 - 5.0 ratio  HgB A1c  Result Value Ref Range   Hgb A1c MFr Bld 6.0 (H) 4.8 - 5.6 %   Est. average glucose Bld gHb Est-mCnc 126 mg/dL      Assessment & Plan:   Problem List Items Addressed This Visit       Other   Other fatigue - Primary    Chronic. Ongoing issues x several months. B12 injections did not improve symptoms.  Will start Mirtazipine 7.37m daily. Side effects and benefits of medication discussed during visit.  Follow up in 2 months.  Call sooner if concerns arise.         Follow up plan: Return in about 2 months (around 10/31/2022) for HTN, HLD, DM2 FU.

## 2022-08-31 NOTE — Assessment & Plan Note (Signed)
Chronic. Ongoing issues x several months. B12 injections did not improve symptoms.  Will start Mirtazipine 7.5mg  daily. Side effects and benefits of medication discussed during visit.  Follow up in 2 months.  Call sooner if concerns arise.

## 2022-09-21 ENCOUNTER — Ambulatory Visit: Payer: Medicare Other | Attending: Cardiology | Admitting: Cardiology

## 2022-09-21 ENCOUNTER — Encounter: Payer: Self-pay | Admitting: Cardiology

## 2022-09-21 VITALS — BP 130/78 | HR 84 | Ht 64.0 in | Wt 137.8 lb

## 2022-09-21 DIAGNOSIS — I340 Nonrheumatic mitral (valve) insufficiency: Secondary | ICD-10-CM | POA: Diagnosis present

## 2022-09-21 DIAGNOSIS — E7800 Pure hypercholesterolemia, unspecified: Secondary | ICD-10-CM | POA: Diagnosis not present

## 2022-09-21 DIAGNOSIS — F17200 Nicotine dependence, unspecified, uncomplicated: Secondary | ICD-10-CM | POA: Insufficient documentation

## 2022-09-21 DIAGNOSIS — I2510 Atherosclerotic heart disease of native coronary artery without angina pectoris: Secondary | ICD-10-CM | POA: Insufficient documentation

## 2022-09-21 MED ORDER — ATORVASTATIN CALCIUM 40 MG PO TABS: 40.0000 mg | ORAL_TABLET | Freq: Every day | ORAL | 5 refills | Status: DC

## 2022-09-21 NOTE — Progress Notes (Signed)
Cardiology Office Note:    Date:  09/21/2022   ID:  Ralph Leon, DOB 29-Dec-1935, MRN [id number]  PCP:  Larae Grooms, NP  Cardiologist:  Debbe Odea, MD  Electrophysiologist:  None   Referring MD: Larae Grooms, NP   Chief Complaint  Patient presents with   Follow-up    Annual f/u, no new cardiac concerns     History of Present Illness:    Ralph Leon is a 86 y.o. male with a hx of CAD (3V coronary calcification on chest CT ), hyperlipidemia, hypertension, mild to moderate MR, stroke, COPD, current smoker x50+ years who presents for follow-up.    States doing okay, still smokes, is working on quitting.  Started on lisinopril for BP control, blood pressure appears adequately controlled.  Does not take cholesterol medication, unsure why, states he was stopped by primary provider.  Denies chest pain, has occasional shortness of breath.   Prior notes Echo 04/2021 EF 60 to 65%, mild to moderate MR Echo 04/2020, EF 55 to 60%, mild to moderate MR Chest CT 02/2020 three-vessel coronary calcifications Lexiscan Myoview 02/2020, no evidence for ischemia, low risk scan    Past Medical History:  Diagnosis Date   COPD (chronic obstructive pulmonary disease) (HCC)    Hypertension    Stroke [facility])     History reviewed. No pertinent surgical history.  Current Medications: Current Meds  Medication Sig   aspirin EC 81 MG tablet Take 1 tablet (81 mg total) by mouth daily. Swallow whole.   atorvastatin (LIPITOR) 40 MG tablet Take 1 tablet (40 mg total) by mouth daily.   lisinopril (ZESTRIL) 20 MG tablet Take 1 tablet by mouth once daily     Allergies:   Patient has no known allergies.   Social History   Socioeconomic History   Marital status: Single    Spouse name: Not on file   Number of children: Not on file   Years of education: Not on file   Highest education level: Not on file  Occupational History   Occupation: retired  Tobacco Use   Smoking status:  Every Day    Packs/day: 0.25    Years: 64.00    Total pack years: 16.00    Types: Cigarettes   Smokeless tobacco: Never   Tobacco comments:    5-6 a day. 03/06/2021  Vaping Use   Vaping Use: Never used  Substance and Sexual Activity   Alcohol use: Not Currently   Drug use: Not Currently   Sexual activity: Not Currently  Other Topics Concern   Not on file  Social History Narrative   Not on file   Social Determinants of Health   Financial Resource Strain: Low Risk  (11/26/2021)   Overall Financial Resource Strain (CARDIA)    Difficulty of Paying Living Expenses: Not hard at all  Food Insecurity: No Food Insecurity (11/26/2021)   Hunger Vital Sign    Worried About Running Out of Food in the Last Year: Never true    Ran Out of Food in the Last Year: Never true  Transportation Needs: No Transportation Needs (11/26/2021)   PRAPARE - Administrator, Civil Service (Medical): No    Lack of Transportation (Non-Medical): No  Physical Activity: Inactive (11/26/2021)   Exercise Vital Sign    Days of Exercise per Week: 0 days    Minutes of Exercise per Session: 0 min  Stress: No Stress Concern Present (11/26/2021)   [employer] of Occupational Health - Occupational Stress Questionnaire  Feeling of Stress : Not at all  Social Connections: Unknown (11/26/2021)   Social Connection and Isolation Panel [NHANES]    Frequency of Communication with Friends and Family: Twice a week    Frequency of Social Gatherings with Friends and Family: Three times a week    Attends Religious Services: Never    Active Member of Clubs or Organizations: No    Attends Archivist Meetings: Never    Marital Status: Not on file     Family History: The patient's family history includes Diabetes in his mother; Heart attack in his father.  ROS:   Please see the history of present illness.     All other systems reviewed and are negative.  EKGs/Labs/Other Studies Reviewed:    The following  studies were reviewed today:   EKG:  EKG is  ordered today.  The ekg ordered today demonstrates normal sinus rhythm  Recent Labs: 01/26/2022: Hemoglobin 14.2; Platelets 222 04/27/2022: ALT 6; BUN 17; Creatinine, Ser 1.10; Potassium 4.5; Sodium 142  Recent Lipid Panel    Component Value Date/Time   CHOL 241 (H) 04/27/2022 1422   TRIG 127 04/27/2022 1422   HDL 64 04/27/2022 1422   CHOLHDL 3.8 04/27/2022 1422   LDLCALC 155 (H) 04/27/2022 1422    Physical Exam:    VS:  BP 130/78 (BP Location: Left Arm, Patient Position: Sitting, Cuff Size: Normal)   Pulse 84   Ht 5\' 4"  (1.626 m)   Wt 137 lb 12.8 oz (62.5 kg)   SpO2 95%   BMI 23.65 kg/m     Wt Readings from Last 3 Encounters:  09/21/22 137 lb 12.8 oz (62.5 kg)  08/31/22 138 lb 12.8 oz (63 kg)  06/29/22 135 lb 11.2 oz (61.6 kg)     GEN:  Well nourished, soft-spoken, appears weak. HEENT: Normal NECK: No JVD; No carotid bruits CARDIAC: RRR, no murmurs, rubs, gallops RESPIRATORY: Decreased breath sounds but otherwise clear ABDOMEN: Soft, non-tender, non-distended MUSCULOSKELETAL:  No edema; No deformity  SKIN: Warm and dry NEUROLOGIC:  Alert and oriented x 3 PSYCHIATRIC:  Normal affect   ASSESSMENT:    1. Coronary artery disease involving native coronary artery of native heart without angina pectoris   2. Mitral valve insufficiency, unspecified etiology   3. Pure hypercholesterolemia   4. Smoking    PLAN:    In order of problems listed above:  CAD/coronary artery calcifications noted on noncontrast chest CT.  Denies chest pain.  LDL not at goal.  Start Lipitor 40 mg daily.  Continue aspirin 81 mg daily. Mild to moderate MR. Last echo 04/2021 showed mild to moderate MR which is stable from prior, EF 60 to 65%.  Plan to repeat echo in 1 year. Hyperlipidemia, start Lipitor 40 mg daily.  Has a history of stroke. Patient is a current smoker.  Smoking cessation advised.    Follow-up in 1 year  This note was generated in  part or whole with voice recognition software. Voice recognition is usually quite accurate but there are transcription errors that can and very often do occur. I apologize for any typographical errors that were not detected and corrected.  Medication Adjustments/Labs and Tests Ordered: Current medicines are reviewed at length with the patient today.  Concerns regarding medicines are outlined above.  Orders Placed This Encounter  Procedures   EKG 12-Lead   ECHOCARDIOGRAM COMPLETE    Meds ordered this encounter  Medications   atorvastatin (LIPITOR) 40 MG tablet    Sig:  Take 1 tablet (40 mg total) by mouth daily.    Dispense:  30 tablet    Refill:  5     Patient Instructions  Medication Instructions:   Your physician has recommended you make the following change in your medication:    START taking Atorvastatin (Lipitor) 40 MG once a day.   *If you need a refill on your cardiac medications before your next appointment, please call your pharmacy*   Testing/Procedures:  Your physician has requested that you have an echocardiogram in 1 year. Echocardiography is a painless test that uses sound waves to create images of your heart. It provides your doctor with information about the size and shape of your heart and how well your heart's chambers and valves are working. This procedure takes approximately one hour. There are no restrictions for this procedure. Please do NOT wear cologne, perfume, aftershave, or lotions (deodorant is allowed). Please arrive 15 minutes prior to your appointment time.    Follow-Up: At [facility], you and your health needs are our priority.  As part of our continuing mission to provide you with exceptional heart care, we have created designated Provider Care Teams.  These Care Teams include your primary Cardiologist (physician) and Advanced Practice Providers (APPs -  Physician Assistants and Nurse Practitioners) who all work together to provide you  with the care you need, when you need it.  We recommend signing up for the patient portal called "MyChart".  Sign up information is provided on this After Visit Summary.  MyChart is used to connect with patients for Virtual Visits (Telemedicine).  Patients are able to view lab/test results, encounter notes, upcoming appointments, etc.  Non-urgent messages can be sent to your provider as well.   To learn more about what you can do with MyChart, go to [email or web address].    Your next appointment:   1 year(s)  The format for your next appointment:   In Person  Provider:   You may see Debbe Odea, MD or one of the following Advanced Practice Providers on your designated Care Team:   Nicolasa Ducking, NP Eula Listen, PA-C Cadence Fransico Michael, PA-C Charlsie Quest, NP    Other Instructions   Important Information About Sugar         Signed, Debbe Odea, MD  09/21/2022 3:23 PM    [facility]

## 2022-09-21 NOTE — Patient Instructions (Signed)
Medication Instructions:   Your physician has recommended you make the following change in your medication:    START taking Atorvastatin (Lipitor) 40 MG once a day.   *If you need a refill on your cardiac medications before your next appointment, please call your pharmacy*   Testing/Procedures:  Your physician has requested that you have an echocardiogram in 1 year. Echocardiography is a painless test that uses sound waves to create images of your heart. It provides your doctor with information about the size and shape of your heart and how well your heart's chambers and valves are working. This procedure takes approximately one hour. There are no restrictions for this procedure. Please do NOT wear cologne, perfume, aftershave, or lotions (deodorant is allowed). Please arrive 15 minutes prior to your appointment time.    Follow-Up: At [facility], you and your health needs are our priority.  As part of our continuing mission to provide you with exceptional heart care, we have created designated Provider Care Teams.  These Care Teams include your primary Cardiologist (physician) and Advanced Practice Providers (APPs -  Physician Assistants and Nurse Practitioners) who all work together to provide you with the care you need, when you need it.  We recommend signing up for the patient portal called "MyChart".  Sign up information is provided on this After Visit Summary.  MyChart is used to connect with patients for Virtual Visits (Telemedicine).  Patients are able to view lab/test results, encounter notes, upcoming appointments, etc.  Non-urgent messages can be sent to your provider as well.   To learn more about what you can do with MyChart, go to [email or web address].    Your next appointment:   1 year(s)  The format for your next appointment:   In Person  Provider:   You may see Kate Sable, MD or one of the following Advanced Practice Providers on your designated  Care Team:   Murray Hodgkins, NP Christell Faith, PA-C Cadence Kathlen Mody, PA-C Gerrie Nordmann, NP    Other Instructions   Important Information About Sugar

## 2022-10-26 ENCOUNTER — Other Ambulatory Visit: Payer: Self-pay | Admitting: Nurse Practitioner

## 2022-10-27 NOTE — Telephone Encounter (Signed)
Requested medication (s) are due for refill today: yes  Requested medication (s) are on the active medication list: yes  Last refill:  07/31/22 #90  Future visit scheduled: yes  Notes to clinic:  overdue lab work   Requested Prescriptions  Pending Prescriptions Disp Refills   lisinopril (ZESTRIL) 20 MG tablet [Pharmacy Med Name: Lisinopril 20 MG Oral Tablet] 90 tablet 0    Sig: Take 1 tablet by mouth once daily     Cardiovascular:  ACE Inhibitors Failed - 10/26/2022  1:37 PM      Failed - Cr in normal range and within 180 days    Creatinine, Ser  Date Value Ref Range Status  04/27/2022 1.10 0.76 - 1.27 mg/dL Final         Failed - K in normal range and within 180 days    Potassium  Date Value Ref Range Status  04/27/2022 4.5 3.5 - 5.2 mmol/L Final         Passed - Patient is not pregnant      Passed - Last BP in normal range    BP Readings from Last 1 Encounters:  09/21/22 130/78         Passed - Valid encounter within last 6 months    Recent Outpatient Visits           1 month ago Other fatigue   [facility] Larae Grooms, NP   4 months ago Essential hypertension   [facility] Larae Grooms, NP   6 months ago Essential hypertension   [facility] Larae Grooms, NP   9 months ago Essential hypertension   [facility] Larae Grooms, NP   11 months ago Essential hypertension   [facility] Larae Grooms, NP       Future Appointments             In 6 days Larae Grooms, NP [facility]

## 2022-11-02 ENCOUNTER — Ambulatory Visit (INDEPENDENT_AMBULATORY_CARE_PROVIDER_SITE_OTHER): Payer: Medicare Other | Admitting: Nurse Practitioner

## 2022-11-02 ENCOUNTER — Encounter: Payer: Self-pay | Admitting: Nurse Practitioner

## 2022-11-02 VITALS — BP 130/72 | HR 80 | Temp 98.0°F | Wt 141.7 lb

## 2022-11-02 DIAGNOSIS — I2510 Atherosclerotic heart disease of native coronary artery without angina pectoris: Secondary | ICD-10-CM

## 2022-11-02 DIAGNOSIS — R5383 Other fatigue: Secondary | ICD-10-CM

## 2022-11-02 DIAGNOSIS — J449 Chronic obstructive pulmonary disease, unspecified: Secondary | ICD-10-CM | POA: Diagnosis not present

## 2022-11-02 DIAGNOSIS — R7301 Impaired fasting glucose: Secondary | ICD-10-CM | POA: Diagnosis not present

## 2022-11-02 DIAGNOSIS — I700 Atherosclerosis of aorta: Secondary | ICD-10-CM

## 2022-11-02 DIAGNOSIS — E559 Vitamin D deficiency, unspecified: Secondary | ICD-10-CM | POA: Insufficient documentation

## 2022-11-02 DIAGNOSIS — I10 Essential (primary) hypertension: Secondary | ICD-10-CM

## 2022-11-02 DIAGNOSIS — E7800 Pure hypercholesterolemia, unspecified: Secondary | ICD-10-CM

## 2022-11-02 NOTE — Assessment & Plan Note (Signed)
Chronic.  Continue with Atorvastatin and ASA.  Return to clinic in 3 months for reevaluation.  Call sooner if concerns arise.

## 2022-11-02 NOTE — Assessment & Plan Note (Signed)
Chronic.  Controlled.  Continue with current medication regimen of Lisinpril 20mg  daily.  Return to clinic in 3 months for reevaluation.  Call sooner if concerns arise.

## 2022-11-02 NOTE — Assessment & Plan Note (Signed)
Chronic.  Continue with ASA and Atorvastatin.  Labs ordered today.  Return to clinic in 3 months for reevaluation.  Call sooner if concerns arise.

## 2022-11-02 NOTE — Assessment & Plan Note (Signed)
Chronic. Ongoing issues x several months. B12 injections did not improve symptoms.  Mirtazipine did not improve symptoms.  Patient declines wanting to see specialist.  Will recheck vitamin D at visit today.  Follow up in 3 months.  Call sooner if concerns arise.

## 2022-11-02 NOTE — Assessment & Plan Note (Signed)
Labs ordered at visit today.  Will make recommendations based on lab results.   

## 2022-11-02 NOTE — Assessment & Plan Note (Signed)
Chronic.  Controlled.  Continue with current medication regimen of Atorvastatin.  Labs ordered today.  Return to clinic in 3 months for reevaluation.  Call sooner if concerns arise.   

## 2022-11-02 NOTE — Progress Notes (Signed)
BP 130/72   Pulse 80   Temp 98 F (36.7 C) (Oral)   Wt 141 lb 11.2 oz (64.3 kg)   SpO2 98%   BMI 24.32 kg/m    Subjective:    Patient ID: Ralph Leon, male    DOB: 1936/04/05, 86 y.o.   MRN: [id number]  HPI: Ralph Leon is a 86 y.o. male  Chief Complaint  Patient presents with   Diabetes   Hyperlipidemia   Hypertension    HYPERTENSION Hypertension status: controlled  Satisfied with current treatment? no Duration of hypertension: years BP monitoring frequency:  daily BP range: 130/72 BP medication side effects:  no Medication compliance: excellent compliance Previous BP meds:none Aspirin: yes Recurrent headaches: no Visual changes: no Palpitations: no Dyspnea: no Chest pain: no Lower extremity edema: no Dizzy/lightheaded: no  COPD COPD status: controlled Satisfied with current treatment?: yes Oxygen use: no Dyspnea frequency:  Cough frequency:  Rescue inhaler frequency:   Limitation of activity: no Productive cough:  Last Spirometry:  Pneumovax: Not up to Date Influenza: Not up to Date  Patient states that he is tired all of the time.  He states he also keeps forgetting little things.  Does not know why. He isn't taking the Mirtazipine for sleep.  He states nothing has changed.  He has trouble getting to sleep and then not sure how much he sleeps at night.  He has trouble getting out of bed when it is time.    DEPRESSION Patient states he doesn't feel down or depressed.  States he has never been on medication.  He doesn't feel like he needs to be on medication.  Denies SI.  [facility] Office Visit from 11/02/2022 in [place]  PHQ-9 Total Score 11         11/02/2022    2:04 PM 08/31/2022    1:55 PM 06/29/2022    2:02 PM 04/27/2022    2:21 PM  GAD 7 : Generalized Anxiety Score  Nervous, Anxious, on Edge 0 0 1 0  Control/stop worrying 0 0 0 0  Worry too much - different things 0 0 0 0  Trouble relaxing _0 Restless 0 0  0 0  Easily annoyed or irritable 0 0 0 0  Afraid - awful might happen 0 0 0 0  Total GAD 7 Score _1 Anxiety Difficulty Somewhat difficult Somewhat difficult Not difficult at all Somewhat difficult      Relevant past medical, surgical, family and social history reviewed and updated as indicated. Interim medical history since our last visit reviewed. Allergies and medications reviewed and updated.  Review of Systems  Constitutional:  Positive for fatigue.  Eyes:  Negative for visual disturbance.  Respiratory:  Negative for shortness of breath.   Cardiovascular:  Negative for chest pain and leg swelling.  Neurological:  Negative for light-headedness and headaches.  Psychiatric/Behavioral:  Positive for sleep disturbance.     Per HPI unless specifically indicated above     Objective:    BP 130/72   Pulse 80   Temp 98 F (36.7 C) (Oral)   Wt 141 lb 11.2 oz (64.3 kg)   SpO2 98%   BMI 24.32 kg/m   Wt Readings from Last 3 Encounters:  11/02/22 141 lb 11.2 oz (64.3 kg)  09/21/22 137 lb 12.8 oz (62.5 kg)  08/31/22 138 lb 12.8 oz (63 kg)    Physical Exam Vitals and nursing note reviewed.  Constitutional:  General: He is not in acute distress.    Appearance: Normal appearance. He is not ill-appearing, toxic-appearing or diaphoretic.  HENT:     Head: Normocephalic.     Right Ear: External ear normal.     Left Ear: External ear normal.     Nose: Nose normal. No congestion or rhinorrhea.     Mouth/Throat:     Mouth: Mucous membranes are moist.  Eyes:     General:        Right eye: No discharge.        Left eye: No discharge.     Extraocular Movements: Extraocular movements intact.     Conjunctiva/sclera: Conjunctivae normal.     Pupils: Pupils are equal, round, and reactive to light.  Cardiovascular:     Rate and Rhythm: Normal rate and regular rhythm.     Heart sounds: No murmur heard. Pulmonary:     Effort: Pulmonary effort is normal. No respiratory  distress.     Breath sounds: Normal breath sounds. No wheezing, rhonchi or rales.  Abdominal:     General: Abdomen is flat. Bowel sounds are normal.  Musculoskeletal:     Cervical back: Normal range of motion and neck supple.     Comments: Uses walker.   Skin:    General: Skin is warm and dry.     Capillary Refill: Capillary refill takes less than 2 seconds.  Neurological:     General: No focal deficit present.     Mental Status: He is alert and oriented to person, place, and time.  Psychiatric:        Mood and Affect: Mood normal.        Behavior: Behavior normal.        Thought Content: Thought content normal.        Judgment: Judgment normal.     Results for orders placed or performed in visit on 04/27/22  Comp Met (CMET)  Result Value Ref Range   Glucose 93 70 - 99 mg/dL   BUN 17 8 - 27 mg/dL   Creatinine, Ser 1.10 0.76 - 1.27 mg/dL   eGFR 66 >59 mL/min/1.73   BUN/Creatinine Ratio 15 10 - 24   Sodium 142 134 - 144 mmol/L   Potassium 4.5 3.5 - 5.2 mmol/L   Chloride 104 96 - 106 mmol/L   CO2 26 20 - 29 mmol/L   Calcium 9.2 8.6 - 10.2 mg/dL   Total Protein 6.6 6.0 - 8.5 g/dL   Albumin 4.2 3.6 - 4.6 g/dL   Globulin, Total 2.4 1.5 - 4.5 g/dL   Albumin/Globulin Ratio 1.8 1.2 - 2.2   Bilirubin Total 0.4 0.0 - 1.2 mg/dL   Alkaline Phosphatase 75 44 - 121 IU/L   AST 8 0 - 40 IU/L   ALT 6 0 - 44 IU/L  Lipid Profile  Result Value Ref Range   Cholesterol, Total 241 (H) 100 - 199 mg/dL   Triglycerides 127 0 - 149 mg/dL   HDL 64 >39 mg/dL   VLDL Cholesterol Cal 22 5 - 40 mg/dL   LDL Chol Calc (NIH) 155 (H) 0 - 99 mg/dL   Chol/HDL Ratio 3.8 0.0 - 5.0 ratio  HgB A1c  Result Value Ref Range   Hgb A1c MFr Bld 6.0 (H) 4.8 - 5.6 %   Est. average glucose Bld gHb Est-mCnc 126 mg/dL      Assessment & Plan:   Problem List Items Addressed This Visit       Cardiovascular  and Mediastinum   Essential hypertension    Chronic.  Controlled.  Continue with current medication  regimen of Lisinpril 40m daily.  Return to clinic in 3 months for reevaluation.  Call sooner if concerns arise.        Relevant Orders   Comp Met (CMET)   Coronary artery disease    Chronic.  Continue with Atorvastatin and ASA.  Return to clinic in 3 months for reevaluation.  Call sooner if concerns arise.        Aortic atherosclerosis (HCC) - Primary    Chronic.  Continue with ASA and Atorvastatin.  Labs ordered today.  Return to clinic in 3 months for reevaluation.  Call sooner if concerns arise.          Respiratory   COPD (chronic obstructive pulmonary disease) (HCC)    Chronic.  Controlled.  Continue with current medication regimen.  Return to clinic in 3 months for reevaluation.  Call sooner if concerns arise.          Endocrine   IFG (impaired fasting glucose)    Labs ordered at visit today.  Will make recommendations based on lab results.        Relevant Orders   HgB A1c     Other   Hypercholesteremia    Chronic.  Controlled.  Continue with current medication regimen of Atorvastatin.  Labs ordered today.  Return to clinic in 3 months for reevaluation.  Call sooner if concerns arise.        Relevant Orders   Lipid Profile   Other fatigue    Chronic. Ongoing issues x several months. B12 injections did not improve symptoms.  Mirtazipine did not improve symptoms.  Patient declines wanting to see specialist.  Will recheck vitamin D at visit today.  Follow up in 3 months.  Call sooner if concerns arise.       Vitamin D deficiency    Labs ordered at visit today.  Will make recommendations based on lab results.        Relevant Orders   Vitamin D (25 hydroxy)     Follow up plan: Return in about 3 months (around 02/01/2023) for HTN, HLD, DM2 FU.

## 2022-11-02 NOTE — Assessment & Plan Note (Signed)
Chronic.  Controlled.  Continue with current medication regimen.  Return to clinic in 3 months for reevaluation.  Call sooner if concerns arise.  

## 2022-11-03 LAB — LIPID PANEL
Chol/HDL Ratio: 2.5 ratio (ref 0.0–5.0)
Cholesterol, Total: 172 mg/dL (ref 100–199)
HDL: 70 mg/dL (ref >39)
LDL Chol Calc (NIH): 86 mg/dL (ref 0–99)
Triglycerides: 87 mg/dL (ref 0–149)
VLDL Cholesterol Cal: 16 mg/dL (ref 5–40)

## 2022-11-03 LAB — COMPREHENSIVE METABOLIC PANEL
ALT: 8 IU/L (ref 0–44)
AST: 14 IU/L (ref 0–40)
Albumin/Globulin Ratio: 2.1 (ref 1.2–2.2)
Albumin: 4.7 g/dL (ref 3.7–4.7)
Alkaline Phosphatase: 72 IU/L (ref 44–121)
BUN/Creatinine Ratio: 16 (ref 10–24)
BUN: 17 mg/dL (ref 8–27)
Bilirubin Total: 0.6 mg/dL (ref 0.0–1.2)
CO2: 25 mmol/L (ref 20–29)
Calcium: 9.5 mg/dL (ref 8.6–10.2)
Chloride: 103 mmol/L (ref 96–106)
Creatinine, Ser: 1.06 mg/dL (ref 0.76–1.27)
Globulin, Total: 2.2 g/dL (ref 1.5–4.5)
Glucose: 79 mg/dL (ref 70–99)
Potassium: 4.3 mmol/L (ref 3.5–5.2)
Sodium: 142 mmol/L (ref 134–144)
Total Protein: 6.9 g/dL (ref 6.0–8.5)
eGFR: 69 mL/min/{1.73_m2} (ref >59)

## 2022-11-03 LAB — HEMOGLOBIN A1C
Est. average glucose Bld gHb Est-mCnc: 126 mg/dL
Hgb A1c MFr Bld: 6 % — ABNORMAL HIGH (ref 4.8–5.6)

## 2022-11-03 LAB — VITAMIN D 25 HYDROXY (VIT D DEFICIENCY, FRACTURES): Vit D, 25-Hydroxy: 39.7 ng/mL (ref 30.0–100.0)

## 2022-11-03 NOTE — Progress Notes (Signed)
Please let patient know that his lab work looks great.  A1c is well controlled at 6.0.  Vitamin D is within normal range.  I recommend continuing with the vitamin D supplement.  His liver, kidneys and electrolytes look great.

## 2023-02-01 ENCOUNTER — Encounter: Payer: Self-pay | Admitting: Nurse Practitioner

## 2023-02-01 ENCOUNTER — Ambulatory Visit (INDEPENDENT_AMBULATORY_CARE_PROVIDER_SITE_OTHER): Payer: Medicare Other

## 2023-02-01 ENCOUNTER — Ambulatory Visit (INDEPENDENT_AMBULATORY_CARE_PROVIDER_SITE_OTHER): Payer: Medicare Other | Admitting: Nurse Practitioner

## 2023-02-01 VITALS — BP 129/70 | Ht 64.0 in | Wt 142.0 lb

## 2023-02-01 VITALS — BP 129/70 | HR 89 | Temp 98.1°F | Wt 142.1 lb

## 2023-02-01 DIAGNOSIS — E7800 Pure hypercholesterolemia, unspecified: Secondary | ICD-10-CM

## 2023-02-01 DIAGNOSIS — I2510 Atherosclerotic heart disease of native coronary artery without angina pectoris: Secondary | ICD-10-CM | POA: Diagnosis not present

## 2023-02-01 DIAGNOSIS — R7301 Impaired fasting glucose: Secondary | ICD-10-CM

## 2023-02-01 DIAGNOSIS — Z0000 Encounter for general adult medical examination without abnormal findings: Secondary | ICD-10-CM

## 2023-02-01 DIAGNOSIS — J449 Chronic obstructive pulmonary disease, unspecified: Secondary | ICD-10-CM | POA: Diagnosis not present

## 2023-02-01 DIAGNOSIS — I10 Essential (primary) hypertension: Secondary | ICD-10-CM

## 2023-02-01 DIAGNOSIS — I700 Atherosclerosis of aorta: Secondary | ICD-10-CM

## 2023-02-01 DIAGNOSIS — E559 Vitamin D deficiency, unspecified: Secondary | ICD-10-CM

## 2023-02-01 DIAGNOSIS — G479 Sleep disorder, unspecified: Secondary | ICD-10-CM

## 2023-02-01 NOTE — Assessment & Plan Note (Signed)
Chronic.  Controlled.  Continue with current medication regimen.  Feels like he breaths well.  Return to clinic in 3 months for reevaluation.  Call sooner if concerns arise.

## 2023-02-01 NOTE — Progress Notes (Signed)
BP 129/70   Pulse 89   Temp 98.1 F (36.7 C) (Oral)   Wt 142 lb 1.6 oz (64.5 kg)   SpO2 96%   BMI 24.39 kg/m    Subjective:    Patient ID: Ralph Leon, male    DOB: 06-Jul-1936, 87 y.o.   MRN: [id number]  HPI: Ralph Leon is a 87 y.o. male  Chief Complaint  Patient presents with   Hyperlipidemia    HYPERTENSION Hypertension status: controlled  Satisfied with current treatment? no Duration of hypertension: years BP monitoring frequency:  daily BP range: 120/70 BP medication side effects:  no Medication compliance: excellent compliance Previous BP meds:none Aspirin: yes Recurrent headaches: no Visual changes: no Palpitations: no Dyspnea: no Chest pain: no Lower extremity edema: no Dizzy/lightheaded: no  COPD COPD status: controlled Satisfied with current treatment?: yes Oxygen use: no Dyspnea frequency:  Cough frequency:  Rescue inhaler frequency:   Limitation of activity: no Productive cough:  Last Spirometry:  Pneumovax: Not up to Date Influenza: Not up to Date  Patient states that he is tired all of the time.  He states he also keeps forgetting little things.  He doesn't feel like he forgets things that often.  He is more tired today than normal.  He did not sleep well last night.    DEPRESSION Patient states he doesn't feel down or depressed.  States he has never been on medication.  He doesn't feel like he needs to be on medication.  He feels like his depression screen is up because he is tired all of the time.  Denies SI.  [facility] Office Visit from 02/01/2023 in [place]  PHQ-9 Total Score 10         02/01/2023    2:07 PM 11/02/2022    2:04 PM 08/31/2022    1:55 PM 06/29/2022    2:02 PM  GAD 7 : Generalized Anxiety Score  Nervous, Anxious, on Edge 0 0 0 1  Control/stop worrying 0 0 0 0  Worry too much - different things 1 0 0 0  Trouble relaxing 0 '1 1 1  '$ Restless 0 0 0 0  Easily annoyed or irritable 0 0  0 0  Afraid - awful might happen 0 0 0 0  Total GAD 7 Score '1 1 1 2  '$ Anxiety Difficulty Somewhat difficult Somewhat difficult Somewhat difficult Not difficult at all      Relevant past medical, surgical, family and social history reviewed and updated as indicated. Interim medical history since our last visit reviewed. Allergies and medications reviewed and updated.  Review of Systems  Constitutional:  Positive for fatigue.  Eyes:  Negative for visual disturbance.  Respiratory:  Negative for shortness of breath.   Cardiovascular:  Negative for chest pain and leg swelling.  Neurological:  Negative for light-headedness and headaches.  Psychiatric/Behavioral:  Positive for sleep disturbance.     Per HPI unless specifically indicated above     Objective:    BP 129/70   Pulse 89   Temp 98.1 F (36.7 C) (Oral)   Wt 142 lb 1.6 oz (64.5 kg)   SpO2 96%   BMI 24.39 kg/m   Wt Readings from Last 3 Encounters:  02/01/23 142 lb (64.4 kg)  02/01/23 142 lb 1.6 oz (64.5 kg)  11/02/22 141 lb 11.2 oz (64.3 kg)    Physical Exam Vitals and nursing note reviewed.  Constitutional:      General: He is not in acute  distress.    Appearance: Normal appearance. He is not ill-appearing, toxic-appearing or diaphoretic.  HENT:     Head: Normocephalic.     Right Ear: External ear normal.     Left Ear: External ear normal.     Nose: Nose normal. No congestion or rhinorrhea.     Mouth/Throat:     Mouth: Mucous membranes are moist.  Eyes:     General:        Right eye: No discharge.        Left eye: No discharge.     Extraocular Movements: Extraocular movements intact.     Conjunctiva/sclera: Conjunctivae normal.     Pupils: Pupils are equal, round, and reactive to light.  Cardiovascular:     Rate and Rhythm: Normal rate and regular rhythm.     Heart sounds: No murmur heard. Pulmonary:     Effort: Pulmonary effort is normal. No respiratory distress.     Breath sounds: Normal breath sounds.  No wheezing, rhonchi or rales.  Abdominal:     General: Abdomen is flat. Bowel sounds are normal.  Musculoskeletal:     Cervical back: Normal range of motion and neck supple.     Comments: Uses walker.   Skin:    General: Skin is warm and dry.     Capillary Refill: Capillary refill takes less than 2 seconds.  Neurological:     General: No focal deficit present.     Mental Status: He is alert and oriented to person, place, and time.  Psychiatric:        Mood and Affect: Mood normal.        Behavior: Behavior normal.        Thought Content: Thought content normal.        Judgment: Judgment normal.     Results for orders placed or performed in visit on 11/02/22  Comp Met (CMET)  Result Value Ref Range   Glucose 79 70 - 99 mg/dL   BUN 17 8 - 27 mg/dL   Creatinine, Ser 1.06 0.76 - 1.27 mg/dL   eGFR 69 >59 mL/min/1.73   BUN/Creatinine Ratio 16 10 - 24   Sodium 142 134 - 144 mmol/L   Potassium 4.3 3.5 - 5.2 mmol/L   Chloride 103 96 - 106 mmol/L   CO2 25 20 - 29 mmol/L   Calcium 9.5 8.6 - 10.2 mg/dL   Total Protein 6.9 6.0 - 8.5 g/dL   Albumin 4.7 3.7 - 4.7 g/dL   Globulin, Total 2.2 1.5 - 4.5 g/dL   Albumin/Globulin Ratio 2.1 1.2 - 2.2   Bilirubin Total 0.6 0.0 - 1.2 mg/dL   Alkaline Phosphatase 72 44 - 121 IU/L   AST 14 0 - 40 IU/L   ALT 8 0 - 44 IU/L  Lipid Profile  Result Value Ref Range   Cholesterol, Total 172 100 - 199 mg/dL   Triglycerides 87 0 - 149 mg/dL   HDL 70 >39 mg/dL   VLDL Cholesterol Cal 16 5 - 40 mg/dL   LDL Chol Calc (NIH) 86 0 - 99 mg/dL   Chol/HDL Ratio 2.5 0.0 - 5.0 ratio  HgB A1c  Result Value Ref Range   Hgb A1c MFr Bld 6.0 (H) 4.8 - 5.6 %   Est. average glucose Bld gHb Est-mCnc 126 mg/dL  Vitamin D (25 hydroxy)  Result Value Ref Range   Vit D, 25-Hydroxy 39.7 30.0 - 100.0 ng/mL      Assessment & Plan:   Problem  List Items Addressed This Visit       Cardiovascular and Mediastinum   Essential hypertension    Chronic.  Controlled.   Continue with current medication regimen of Lisinpril '20mg'$  daily.  Not due for refills today.  Return to clinic in 3 months for reevaluation.  Call sooner if concerns arise.        Relevant Orders   Comp Met (CMET)   Coronary artery disease    Chronic.  Continue with Atorvastatin and ASA.  Atorvastatin refilled by Cardiology.  Return to clinic in 3 months for reevaluation.  Call sooner if concerns arise.        Aortic atherosclerosis (HCC) - Primary    Chronic.  Continue with ASA and Atorvastatin. Atorvastatin refilled by Cardiology.  Labs ordered today.  Return to clinic in 3 months for reevaluation.  Call sooner if concerns arise.          Respiratory   COPD (chronic obstructive pulmonary disease) (HCC)    Chronic.  Controlled.  Continue with current medication regimen.  Feels like he breaths well.  Return to clinic in 3 months for reevaluation.  Call sooner if concerns arise.          Endocrine   IFG (impaired fasting glucose)    Labs ordered at visit today.  Will make recommendations based on lab results.        Relevant Orders   HgB A1c     Other   Hypercholesteremia    Chronic.  Controlled.  Continue with current medication regimen of Atorvastatin.  Labs ordered today.  Return to clinic in 3 months for reevaluation.  Call sooner if concerns arise.        Relevant Orders   Lipid Profile   Vitamin D deficiency    Labs ordered at visit today.  Will make recommendations based on lab results.        Relevant Orders   Vitamin D (25 hydroxy)   Other Visit Diagnoses     Sleep disturbance       Recommend patient try Melatonin nightly to help with sleep disturbance. Side effects and benefits of medication discussed during visit.        Follow up plan: No follow-ups on file.

## 2023-02-01 NOTE — Assessment & Plan Note (Signed)
Chronic.  Controlled.  Continue with current medication regimen of Atorvastatin.  Labs ordered today.  Return to clinic in 3 months for reevaluation.  Call sooner if concerns arise.

## 2023-02-01 NOTE — Assessment & Plan Note (Addendum)
Chronic.  Continue with ASA and Atorvastatin. Atorvastatin refilled by Cardiology.  Labs ordered today.  Return to clinic in 3 months for reevaluation.  Call sooner if concerns arise.

## 2023-02-01 NOTE — Progress Notes (Signed)
Subjective:   Ralph Leon is a 87 y.o. male who presents for Medicare Annual/Subsequent preventive examination.  Review of Systems     Cardiac Risk Factors include: hypertension;male gender;sedentary lifestyle     Objective:    Today's Vitals   02/01/23 1429  BP: 129/70  Weight: 142 lb (64.4 kg)  Height: '5\' 4"'$  (1.626 m)   Body mass index is 24.37 kg/m.     02/01/2023    2:35 PM 11/26/2021   12:46 PM 11/25/2020    2:36 PM  Advanced Directives  Does Patient Have a Medical Advance Directive? No No No  Would patient like information on creating a medical advance directive? No - Patient declined No - Patient declined     Current Medications (verified) Outpatient Encounter Medications as of 02/01/2023  Medication Sig   aspirin EC 81 MG tablet Take 1 tablet (81 mg total) by mouth daily. Swallow whole.   atorvastatin (LIPITOR) 40 MG tablet Take 1 tablet (40 mg total) by mouth daily.   lisinopril (ZESTRIL) 20 MG tablet Take 1 tablet by mouth once daily   No facility-administered encounter medications on file as of 02/01/2023.    Allergies (verified) Patient has no known allergies.   History: Past Medical History:  Diagnosis Date   COPD (chronic obstructive pulmonary disease) ([place])    Hypertension    Stroke ([place])    No past surgical history on file. Family History  Problem Relation Age of Onset   Diabetes Mother    Heart attack Father    Social History   Socioeconomic History   Marital status: Single    Spouse name: Not on file   Number of children: Not on file   Years of education: Not on file   Highest education level: Not on file  Occupational History   Occupation: retired  Tobacco Use   Smoking status: Every Day    Packs/day: 0.25    Years: 64.00    Total pack years: 16.00    Types: Cigarettes   Smokeless tobacco: Never   Tobacco comments:    5-6 a day. 03/06/2021  Vaping Use   Vaping Use: Never used  Substance and Sexual Activity   Alcohol use:  Not Currently   Drug use: Not Currently   Sexual activity: Not Currently  Other Topics Concern   Not on file  Social History Narrative   Not on file   Social Determinants of Health   Financial Resource Strain: Low Risk  (02/01/2023)   Overall Financial Resource Strain (CARDIA)    Difficulty of Paying Living Expenses: Not hard at all  Food Insecurity: No Food Insecurity (02/01/2023)   Hunger Vital Sign    Worried About Running Out of Food in the Last Year: Never true    Ran Out of Food in the Last Year: Never true  Transportation Needs: No Transportation Needs (02/01/2023)   PRAPARE - Hydrologist (Medical): No    Lack of Transportation (Non-Medical): No  Physical Activity: Insufficiently Active (02/01/2023)   Exercise Vital Sign    Days of Exercise per Week: 2 days    Minutes of Exercise per Session: 10 min  Stress: No Stress Concern Present (02/01/2023)   [place]    Feeling of Stress : Not at all  Social Connections: Unknown (02/01/2023)   Social Connection and Isolation Panel [NHANES]    Frequency of Communication with Friends and Family: More than three  times a week    Frequency of Social Gatherings with Friends and Family: Once a week    Attends Religious Services: Never    Marine scientist or Organizations: No    Attends Music therapist: Never    Marital Status: Patient refused    Tobacco Counseling Ready to quit: Not Answered Counseling given: Not Answered Tobacco comments: 5-6 a day. 03/06/2021   Clinical Intake:  Pre-visit preparation completed: Yes  Pain : No/denies pain     Nutritional Risks: None Diabetes: No  How often do you need to have someone help you when you read instructions, pamphlets, or other written materials from your doctor or pharmacy?: 1 - Never  Diabetic?no  Interpreter Needed?: No  Information entered by :: Kirke Shaggy, LPN   Activities of Daily Living    02/01/2023    2:37 PM 02/01/2023    2:04 PM  In your present state of health, do you have any difficulty performing the following activities:  Hearing? 1 1  Vision? 0 0  Difficulty concentrating or making decisions? 1 1  Walking or climbing stairs? 1 1  Dressing or bathing? 0 0  Doing errands, shopping? 0 0  Preparing Food and eating ? N   Using the Toilet? N   In the past six months, have you accidently leaked urine? N   Do you have problems with loss of bowel control? N   Managing your Medications? N   Managing your Finances? N   Housekeeping or managing your Housekeeping? N     Patient Care Team: Jon Billings, NP as PCP - General Kate Sable, MD as PCP - Cardiology (Cardiology)  Indicate any recent Medical Services you may have received from other than [facility] providers in the past year (date may be approximate).     Assessment:   This is a routine wellness examination for Ralph Leon.  Hearing/Vision screen Hearing Screening - Comments:: No aids Vision Screening - Comments:: Wears glasses-  Dietary issues and exercise activities discussed: Current Exercise Habits: Home exercise routine, Type of exercise: walking, Time (Minutes): 10, Frequency (Times/Week): 2, Weekly Exercise (Minutes/Week): 20, Intensity: Mild   Goals Addressed             This Visit's Progress    DIET - EAT MORE FRUITS AND VEGETABLES         Depression Screen    02/01/2023    2:33 PM 02/01/2023    2:06 PM 11/02/2022    2:04 PM 08/31/2022    1:55 PM 06/29/2022    2:01 PM 04/27/2022    2:21 PM 01/26/2022    3:23 PM  PHQ 2/9 Scores  PHQ - 2 Score '1 1 2 1 '$ 0 1 1  PHQ- 9 Score '5 10 11 6 9 8 10    '$ Fall Risk    02/01/2023    2:36 PM 02/01/2023    2:05 PM 11/02/2022    2:04 PM 08/31/2022    1:55 PM 04/27/2022    2:21 PM  Fall Risk   Falls in the past year? 0 0 0 0 0  Number falls in past yr: 0 0 0 0 0  Injury with Fall? 0 0 0 0 0  Risk for fall  due to : No Fall Risks No Fall Risks No Fall Risks No Fall Risks Impaired mobility;Impaired balance/gait  Follow up Falls prevention discussed;Falls evaluation completed Falls evaluation completed Falls evaluation completed Falls evaluation completed     FALL RISK  PREVENTION PERTAINING TO THE HOME:  Any stairs in or around the home? Yes  If so, are there any without handrails? No  Home free of loose throw rugs in walkways, pet beds, electrical cords, etc? Yes  Adequate lighting in your home to reduce risk of falls? Yes   ASSISTIVE DEVICES UTILIZED TO PREVENT FALLS:  Life alert? No  Use of a cane, walker or w/c? Yes walker Grab bars in the bathroom? Yes  Shower chair or bench in shower? No  Elevated toilet seat or a handicapped toilet? No   TIMED UP AND GO:  Was the test performed? Yes .  Length of time to ambulate 10 feet: 6 sec.   Gait slow and steady with assistive device  Cognitive Function:        11/26/2021   12:49 PM 11/25/2020    2:40 PM  6CIT Screen  What Year? 0 points 0 points  What month? 0 points 0 points  What time? 0 points 0 points  Count back from 20 0 points 2 points  Months in reverse 2 points 0 points  Repeat phrase 6 points 4 points  Total Score 8 points 6 points    Immunizations  There is no immunization history on file for this patient.  TDAP status: Due, Education has been provided regarding the importance of this vaccine. Advised may receive this vaccine at local pharmacy or Health Dept. Aware to provide a copy of the vaccination record if obtained from local pharmacy or Health Dept. Verbalized acceptance and understanding.  Flu Vaccine status: Declined, Education has been provided regarding the importance of this vaccine but patient still declined. Advised may receive this vaccine at local pharmacy or Health Dept. Aware to provide a copy of the vaccination record if obtained from local pharmacy or Health Dept. Verbalized acceptance and  understanding.  Pneumococcal vaccine status: Declined,  Education has been provided regarding the importance of this vaccine but patient still declined. Advised may receive this vaccine at local pharmacy or Health Dept. Aware to provide a copy of the vaccination record if obtained from local pharmacy or Health Dept. Verbalized acceptance and understanding.   Covid-19 vaccine status: Declined, Education has been provided regarding the importance of this vaccine but patient still declined. Advised may receive this vaccine at local pharmacy or Health Dept.or vaccine clinic. Aware to provide a copy of the vaccination record if obtained from local pharmacy or Health Dept. Verbalized acceptance and understanding.  Qualifies for Shingles Vaccine? Yes   Zostavax completed No   Shingrix Completed?: No.    Education has been provided regarding the importance of this vaccine. Patient has been advised to call insurance company to determine out of pocket expense if they have not yet received this vaccine. Advised may also receive vaccine at local pharmacy or Health Dept. Verbalized acceptance and understanding.  Screening Tests Health Maintenance  Topic Date Due   COVID-19 Vaccine (1) Never done   DTaP/Tdap/Td (1 - Tdap) Never done   Zoster Vaccines- Shingrix (1 of 2) 02/01/2023 (Originally 02/17/1986)   INFLUENZA VACCINE  02/21/2023 (Originally 06/23/2022)   Pneumonia Vaccine 66+ Years old (1 of 2 - PCV) 11/03/2023 (Originally 02/17/1942)   Medicare Annual Wellness (AWV)  02/01/2024   HPV VACCINES  Aged Out    Health Maintenance  Health Maintenance Due  Topic Date Due   COVID-19 Vaccine (1) Never done   DTaP/Tdap/Td (1 - Tdap) Never done    Colorectal cancer screening: No longer required.  Lung Cancer Screening: (Low Dose CT Chest recommended if Age 46-80 years, 30 pack-year currently smoking OR have quit w/in 15years.) does not qualify.     Additional Screening:  Hepatitis C Screening: does  not qualify; Completed no  Vision Screening: Recommended annual ophthalmology exams for early detection of glaucoma and other disorders of the eye. Is the patient up to date with their annual eye exam?  No  Who is the provider or what is the name of the office in which the patient attends annual eye exams? No one, doesn't remember If pt is not established with a provider, would they like to be referred to a provider to establish care? No .   Dental Screening: Recommended annual dental exams for proper oral hygiene  Community Resource Referral / Chronic Care Management: CRR required this visit?  No   CCM required this visit?  No      Plan:     I have personally reviewed and noted the following in the patient's chart:   Medical and social history Use of alcohol, tobacco or illicit drugs  Current medications and supplements including opioid prescriptions. Patient is not currently taking opioid prescriptions. Functional ability and status Nutritional status Physical activity Advanced directives List of other physicians Hospitalizations, surgeries, and ER visits in previous 12 months Vitals Screenings to include cognitive, depression, and falls Referrals and appointments  In addition, I have reviewed and discussed with patient certain preventive protocols, quality metrics, and best practice recommendations. A written personalized care plan for preventive services as well as general preventive health recommendations were provided to patient.     Dionisio David, LPN   [id number]   Nurse Notes: none

## 2023-02-01 NOTE — Assessment & Plan Note (Signed)
Chronic.  Continue with Atorvastatin and ASA.  Atorvastatin refilled by Cardiology.  Return to clinic in 3 months for reevaluation.  Call sooner if concerns arise.

## 2023-02-01 NOTE — Assessment & Plan Note (Signed)
Chronic.  Controlled.  Continue with current medication regimen of Lisinpril '20mg'$  daily.  Not due for refills today.  Return to clinic in 3 months for reevaluation.  Call sooner if concerns arise.

## 2023-02-01 NOTE — Assessment & Plan Note (Signed)
Labs ordered at visit today.  Will make recommendations based on lab results.   

## 2023-02-01 NOTE — Patient Instructions (Signed)
[name] , Thank you for taking time to come for your Medicare Wellness Visit. I appreciate your ongoing commitment to your health goals. Please review the following plan we discussed and let me know if I can assist you in the future.   These are the goals we discussed:  Goals      DIET - EAT MORE FRUITS AND VEGETABLES     Patient Stated     11/25/2020, no goals     Patient Stated     No goals        This is a list of the screening recommended for you and due dates:  Health Maintenance  Topic Date Due   COVID-19 Vaccine (1) Never done   DTaP/Tdap/Td vaccine (1 - Tdap) Never done   Zoster (Shingles) Vaccine (1 of 2) 02/01/2023*   Flu Shot  02/21/2023*   Pneumonia Vaccine (1 of 2 - PCV) 11/03/2023*   Medicare Annual Wellness Visit  02/01/2024   HPV Vaccine  Aged Out  *Topic was postponed. The date shown is not the original due date.    Advanced directives: no  Conditions/risks identified: none  Next appointment: Follow up in one year for your annual wellness visit. 02/07/24 @ 2:30 pm in person  Preventive Care 65 Years and Older, Male  Preventive care refers to lifestyle choices and visits with your health care provider that can promote health and wellness. What does preventive care include? A yearly physical exam. This is also called an annual well check. Dental exams once or twice a year. Routine eye exams. Ask your health care provider how often you should have your eyes checked. Personal lifestyle choices, including: Daily care of your teeth and gums. Regular physical activity. Eating a healthy diet. Avoiding tobacco and drug use. Limiting alcohol use. Practicing safe sex. Taking low doses of aspirin every day. Taking vitamin and mineral supplements as recommended by your health care provider. What happens during an annual well check? The services and screenings done by your health care provider during your annual well check will depend on your age, overall  health, lifestyle risk factors, and family history of disease. Counseling  Your health care provider may ask you questions about your: Alcohol use. Tobacco use. Drug use. Emotional well-being. Home and relationship well-being. Sexual activity. Eating habits. History of falls. Memory and ability to understand (cognition). Work and work Statistician. Screening  You may have the following tests or measurements: Height, weight, and BMI. Blood pressure. Lipid and cholesterol levels. These may be checked every 5 years, or more frequently if you are over 70 years old. Skin check. Lung cancer screening. You may have this screening every year starting at age 62 if you have a 30-pack-year history of smoking and currently smoke or have quit within the past 15 years. Fecal occult blood test (FOBT) of the stool. You may have this test every year starting at age 34. Flexible sigmoidoscopy or colonoscopy. You may have a sigmoidoscopy every 5 years or a colonoscopy every 10 years starting at age 33. Prostate cancer screening. Recommendations will vary depending on your family history and other risks. Hepatitis C blood test. Hepatitis B blood test. Sexually transmitted disease (STD) testing. Diabetes screening. This is done by checking your blood sugar (glucose) after you have not eaten for a while (fasting). You may have this done every 1-3 years. Abdominal aortic aneurysm (AAA) screening. You may need this if you are a current or former smoker. Osteoporosis. You may be screened  starting at age 36 if you are at high risk. Talk with your health care provider about your test results, treatment options, and if necessary, the need for more tests. Vaccines  Your health care provider may recommend certain vaccines, such as: Influenza vaccine. This is recommended every year. Tetanus, diphtheria, and acellular pertussis (Tdap, Td) vaccine. You may need a Td booster every 10 years. Zoster vaccine. You may  need this after age 75. Pneumococcal 13-valent conjugate (PCV13) vaccine. One dose is recommended after age 60. Pneumococcal polysaccharide (PPSV23) vaccine. One dose is recommended after age 29. Talk to your health care provider about which screenings and vaccines you need and how often you need them. This information is not intended to replace advice given to you by your health care provider. Make sure you discuss any questions you have with your health care provider. Document Released: 12/06/2015 Document Revised: 07/29/2016 Document Reviewed: 09/10/2015 Elsevier Interactive Patient Education  2017 [place] Prevention in the Home Falls can cause injuries. They can happen to people of all ages. There are many things you can do to make your home safe and to help prevent falls. What can I do on the outside of my home? Regularly fix the edges of walkways and driveways and fix any cracks. Remove anything that might make you trip as you walk through a door, such as a raised step or threshold. Trim any bushes or trees on the path to your home. Use bright outdoor lighting. Clear any walking paths of anything that might make someone trip, such as rocks or tools. Regularly check to see if handrails are loose or broken. Make sure that both sides of any steps have handrails. Any raised decks and porches should have guardrails on the edges. Have any leaves, snow, or ice cleared regularly. Use sand or salt on walking paths during winter. Clean up any spills in your garage right away. This includes oil or grease spills. What can I do in the bathroom? Use night lights. Install grab bars by the toilet and in the tub and shower. Do not use towel bars as grab bars. Use non-skid mats or decals in the tub or shower. If you need to sit down in the shower, use a plastic, non-slip stool. Keep the floor dry. Clean up any water that spills on the floor as soon as it happens. Remove soap buildup in  the tub or shower regularly. Attach bath mats securely with double-sided non-slip rug tape. Do not have throw rugs and other things on the floor that can make you trip. What can I do in the bedroom? Use night lights. Make sure that you have a light by your bed that is easy to reach. Do not use any sheets or blankets that are too big for your bed. They should not hang down onto the floor. Have a firm chair that has side arms. You can use this for support while you get dressed. Do not have throw rugs and other things on the floor that can make you trip. What can I do in the kitchen? Clean up any spills right away. Avoid walking on wet floors. Keep items that you use a lot in easy-to-reach places. If you need to reach something above you, use a strong step stool that has a grab bar. Keep electrical cords out of the way. Do not use floor polish or wax that makes floors slippery. If you must use wax, use non-skid floor wax. Do not have throw  rugs and other things on the floor that can make you trip. What can I do with my stairs? Do not leave any items on the stairs. Make sure that there are handrails on both sides of the stairs and use them. Fix handrails that are broken or loose. Make sure that handrails are as long as the stairways. Check any carpeting to make sure that it is firmly attached to the stairs. Fix any carpet that is loose or worn. Avoid having throw rugs at the top or bottom of the stairs. If you do have throw rugs, attach them to the floor with carpet tape. Make sure that you have a light switch at the top of the stairs and the bottom of the stairs. If you do not have them, ask someone to add them for you. What else can I do to help prevent falls? Wear shoes that: Do not have high heels. Have rubber bottoms. Are comfortable and fit you well. Are closed at the toe. Do not wear sandals. If you use a stepladder: Make sure that it is fully opened. Do not climb a closed  stepladder. Make sure that both sides of the stepladder are locked into place. Ask someone to hold it for you, if possible. Clearly mark and make sure that you can see: Any grab bars or handrails. First and last steps. Where the edge of each step is. Use tools that help you move around (mobility aids) if they are needed. These include: Canes. Walkers. Scooters. Crutches. Turn on the lights when you go into a dark area. Replace any light bulbs as soon as they burn out. Set up your furniture so you have a clear path. Avoid moving your furniture around. If any of your floors are uneven, fix them. If there are any pets around you, be aware of where they are. Review your medicines with your doctor. Some medicines can make you feel dizzy. This can increase your chance of falling. Ask your doctor what other things that you can do to help prevent falls. This information is not intended to replace advice given to you by your health care provider. Make sure you discuss any questions you have with your health care provider. Document Released: 09/05/2009 Document Revised: 04/16/2016 Document Reviewed: 12/14/2014 Elsevier Interactive Patient Education  2017 [employer].

## 2023-02-02 LAB — HEMOGLOBIN A1C
Est. average glucose Bld gHb Est-mCnc: 128 mg/dL
Hgb A1c MFr Bld: 6.1 % — ABNORMAL HIGH (ref 4.8–5.6)

## 2023-02-02 LAB — LIPID PANEL
Chol/HDL Ratio: 2.5 ratio (ref 0.0–5.0)
Cholesterol, Total: 162 mg/dL (ref 100–199)
HDL: 66 mg/dL (ref >39)
LDL Chol Calc (NIH): 79 mg/dL (ref 0–99)
Triglycerides: 96 mg/dL (ref 0–149)
VLDL Cholesterol Cal: 17 mg/dL (ref 5–40)

## 2023-02-02 LAB — COMPREHENSIVE METABOLIC PANEL
ALT: 16 IU/L (ref 0–44)
AST: 18 IU/L (ref 0–40)
Albumin/Globulin Ratio: 1.9 (ref 1.2–2.2)
Albumin: 4.3 g/dL (ref 3.7–4.7)
Alkaline Phosphatase: 95 IU/L (ref 44–121)
BUN/Creatinine Ratio: 19 (ref 10–24)
BUN: 21 mg/dL (ref 8–27)
Bilirubin Total: 0.7 mg/dL (ref 0.0–1.2)
CO2: 25 mmol/L (ref 20–29)
Calcium: 9.6 mg/dL (ref 8.6–10.2)
Chloride: 102 mmol/L (ref 96–106)
Creatinine, Ser: 1.11 mg/dL (ref 0.76–1.27)
Globulin, Total: 2.3 g/dL (ref 1.5–4.5)
Glucose: 100 mg/dL — ABNORMAL HIGH (ref 70–99)
Potassium: 4.4 mmol/L (ref 3.5–5.2)
Sodium: 142 mmol/L (ref 134–144)
Total Protein: 6.6 g/dL (ref 6.0–8.5)
eGFR: 65 mL/min/{1.73_m2} (ref >59)

## 2023-02-02 LAB — VITAMIN D 25 HYDROXY (VIT D DEFICIENCY, FRACTURES): Vit D, 25-Hydroxy: 44.2 ng/mL (ref 30.0–100.0)

## 2023-02-02 NOTE — Progress Notes (Signed)
Please let patient know that his lab work looks good.  His A1c is stable at 6.1%.  His cholesterol is well controlled.  Vitamin D is also well controlled.  No concerns at this time.  Continue with current medication regimen. Follow up as discussed.

## 2023-03-03 ENCOUNTER — Encounter: Payer: Self-pay | Admitting: Nurse Practitioner

## 2023-03-03 ENCOUNTER — Ambulatory Visit (INDEPENDENT_AMBULATORY_CARE_PROVIDER_SITE_OTHER): Payer: Medicare Other | Admitting: Nurse Practitioner

## 2023-03-03 VITALS — BP 146/76 | HR 89 | Temp 97.7°F | Ht 64.0 in | Wt 140.2 lb

## 2023-03-03 DIAGNOSIS — E559 Vitamin D deficiency, unspecified: Secondary | ICD-10-CM | POA: Diagnosis not present

## 2023-03-03 DIAGNOSIS — R5383 Other fatigue: Secondary | ICD-10-CM

## 2023-03-03 DIAGNOSIS — R413 Other amnesia: Secondary | ICD-10-CM | POA: Diagnosis not present

## 2023-03-03 DIAGNOSIS — W19XXXD Unspecified fall, subsequent encounter: Secondary | ICD-10-CM

## 2023-03-03 NOTE — Assessment & Plan Note (Signed)
Chronic. Ongoing problem.  Patient feels like his symptoms have worsened.  Anemia panel, thyroid and vitamin D checked during visit.  Referral placed for Neurology.

## 2023-03-03 NOTE — Progress Notes (Signed)
BP (!) 146/76 (BP Location: Left Arm, Cuff Size: Normal)   Pulse 89   Temp 97.7 F (36.5 C) (Oral)   Ht 5\' 4"  (1.626 m)   Wt 140 lb 3.2 oz (63.6 kg)   SpO2 98%   BMI 24.07 kg/m    Subjective:    Patient ID: Dracen Fluharty, male    DOB: 29-Jan-1936, 87 y.o.   MRN: [id number]  HPI: Shiah Quale is a 87 y.o. male  Chief Complaint  Patient presents with   Fatigue   Weakness   FATIGUE Patient states he is sleeping better but having a hard time getting out of bed in the morning.  He is forgetting things more often.  He forgets more names.  He also feels like he is dribbling out of his mouth some.   Duration:  months Severity: moderate  Onset: gradual Context when symptoms started:  unknown Symptoms improve with rest: no  Depressive symptoms: yes Stress/anxiety: no Insomnia: no  improved slep Snoring: no Observed apnea by bed partner: no Daytime hypersomnolence:yes Wakes feeling refreshed: no History of sleep study: no Dysnea on exertion:  no Orthopnea/PND: no Chest pain: no Chronic cough:  sometimes in the morning Lower extremity edema: no Arthralgias:no Myalgias: no Weakness: yes Rash: no  Relevant past medical, surgical, family and social history reviewed and updated as indicated. Interim medical history since our last visit reviewed. Allergies and medications reviewed and updated.  Review of Systems  Constitutional:  Positive for fatigue.  Respiratory:  Negative for shortness of breath.   Cardiovascular:  Negative for chest pain.  Musculoskeletal:  Negative for arthralgias and myalgias.  Skin:  Negative for rash.  Neurological:  Positive for weakness.       Memory difficulty. Drooling   Psychiatric/Behavioral:  Negative for sleep disturbance.     Per HPI unless specifically indicated above     Objective:    BP (!) 146/76 (BP Location: Left Arm, Cuff Size: Normal)   Pulse 89   Temp 97.7 F (36.5 C) (Oral)   Ht 5\' 4"  (1.626 m)   Wt 140 lb 3.2 oz  (63.6 kg)   SpO2 98%   BMI 24.07 kg/m   Wt Readings from Last 3 Encounters:  03/03/23 140 lb 3.2 oz (63.6 kg)  02/01/23 142 lb (64.4 kg)  02/01/23 142 lb 1.6 oz (64.5 kg)    Physical Exam Vitals and nursing note reviewed.  Constitutional:      General: He is not in acute distress.    Appearance: Normal appearance. He is not ill-appearing, toxic-appearing or diaphoretic.  HENT:     Head: Normocephalic.     Right Ear: External ear normal.     Left Ear: External ear normal.     Nose: Nose normal. No congestion or rhinorrhea.     Mouth/Throat:     Mouth: Mucous membranes are moist.  Eyes:     General:        Right eye: No discharge.        Left eye: No discharge.     Extraocular Movements: Extraocular movements intact.     Conjunctiva/sclera: Conjunctivae normal.     Pupils: Pupils are equal, round, and reactive to light.  Cardiovascular:     Rate and Rhythm: Normal rate and regular rhythm.     Heart sounds: No murmur heard. Pulmonary:     Effort: Pulmonary effort is normal. No respiratory distress.     Breath sounds: Normal breath sounds. No wheezing, rhonchi or  rales.  Abdominal:     General: Abdomen is flat. Bowel sounds are normal.  Musculoskeletal:     Cervical back: Normal range of motion and neck supple.  Skin:    General: Skin is warm and dry.     Capillary Refill: Capillary refill takes less than 2 seconds.  Neurological:     General: No focal deficit present.     Mental Status: He is alert and oriented to person, place, and time.  Psychiatric:        Mood and Affect: Mood normal.        Behavior: Behavior normal.        Thought Content: Thought content normal.        Judgment: Judgment normal.     Results for orders placed or performed in visit on 02/01/23  Comp Met (CMET)  Result Value Ref Range   Glucose 100 (H) 70 - 99 mg/dL   BUN 21 8 - 27 mg/dL   Creatinine, Ser 2.48 0.76 - 1.27 mg/dL   eGFR 65 >25 OI/BBC/4.88   BUN/Creatinine Ratio 19 10 - 24    Sodium 142 134 - 144 mmol/L   Potassium 4.4 3.5 - 5.2 mmol/L   Chloride 102 96 - 106 mmol/L   CO2 25 20 - 29 mmol/L   Calcium 9.6 8.6 - 10.2 mg/dL   Total Protein 6.6 6.0 - 8.5 g/dL   Albumin 4.3 3.7 - 4.7 g/dL   Globulin, Total 2.3 1.5 - 4.5 g/dL   Albumin/Globulin Ratio 1.9 1.2 - 2.2   Bilirubin Total 0.7 0.0 - 1.2 mg/dL   Alkaline Phosphatase 95 44 - 121 IU/L   AST 18 0 - 40 IU/L   ALT 16 0 - 44 IU/L  Lipid Profile  Result Value Ref Range   Cholesterol, Total 162 100 - 199 mg/dL   Triglycerides 96 0 - 149 mg/dL   HDL 66 >89 mg/dL   VLDL Cholesterol Cal 17 5 - 40 mg/dL   LDL Chol Calc (NIH) 79 0 - 99 mg/dL   Chol/HDL Ratio 2.5 0.0 - 5.0 ratio  HgB A1c  Result Value Ref Range   Hgb A1c MFr Bld 6.1 (H) 4.8 - 5.6 %   Est. average glucose Bld gHb Est-mCnc 128 mg/dL  Vitamin D (25 hydroxy)  Result Value Ref Range   Vit D, 25-Hydroxy 44.2 30.0 - 100.0 ng/mL      Assessment & Plan:   Problem List Items Addressed This Visit       Other   Fall   Relevant Orders   Anemia Profile B   Other fatigue - Primary    Chronic. Ongoing problem.  Patient feels like his symptoms have worsened.  Anemia panel, thyroid and vitamin D checked during visit.  Referral placed for Neurology.      Relevant Orders   Anemia Profile B   TSH   T4, free   Vitamin D deficiency    Labs ordered at visit today.  Will make recommendations based on lab results.        Relevant Orders   Anemia Profile B   Vitamin D (25 hydroxy)   Other Visit Diagnoses     Memory deficit       Patient agrees to see Neurology.  Discussed changes seen on last MRI. New referral placed during visit.   Relevant Orders   Anemia Profile B   Ambulatory referral to Neurology        Follow up plan: Return  in about 6 weeks (around 04/14/2023) for HTN, HLD, DM2 FU.

## 2023-03-03 NOTE — Assessment & Plan Note (Signed)
Labs ordered at visit today.  Will make recommendations based on lab results.   

## 2023-03-04 LAB — CBC WITH DIFFERENTIAL/PLATELET
Basophils Absolute: 0.1 10*3/uL (ref 0.0–0.2)
Basos: 1 %
EOS (ABSOLUTE): 0.3 10*3/uL (ref 0.0–0.4)
Eos: 4 %
Hematocrit: 42.7 % (ref 37.5–51.0)
Hemoglobin: 13.9 g/dL (ref 13.0–17.7)
Immature Grans (Abs): 0 10*3/uL (ref 0.0–0.1)
Immature Granulocytes: 0 %
Lymphocytes Absolute: 1.8 10*3/uL (ref 0.7–3.1)
Lymphs: 22 %
MCH: 29.1 pg (ref 26.6–33.0)
MCHC: 32.6 g/dL (ref 31.5–35.7)
MCV: 90 fL (ref 79–97)
Monocytes Absolute: 0.7 10*3/uL (ref 0.1–0.9)
Monocytes: 8 %
Neutrophils Absolute: 5.4 10*3/uL (ref 1.4–7.0)
Neutrophils: 65 %
Platelets: 236 10*3/uL (ref 150–450)
RBC: 4.77 x10E6/uL (ref 4.14–5.80)
RDW: 13.3 % (ref 11.6–15.4)
WBC: 8.3 10*3/uL (ref 3.4–10.8)

## 2023-03-04 LAB — T4, FREE: Free T4: 1.19 ng/dL (ref 0.82–1.77)

## 2023-03-04 LAB — VITAMIN D 25 HYDROXY (VIT D DEFICIENCY, FRACTURES): Vit D, 25-Hydroxy: 39.8 ng/mL (ref 30.0–100.0)

## 2023-03-04 LAB — TSH: TSH: 2.24 u[IU]/mL (ref 0.450–4.500)

## 2023-03-04 NOTE — Progress Notes (Signed)
Please let patietn know that his thyroid, complete blood count, B12 and vitamin D are all within normal range.  I recommend seeing neurology.  If he is also open to it we can try physical therapy to help get some of his strength back.

## 2023-04-14 ENCOUNTER — Encounter: Payer: Self-pay | Admitting: Nurse Practitioner

## 2023-04-14 ENCOUNTER — Ambulatory Visit (INDEPENDENT_AMBULATORY_CARE_PROVIDER_SITE_OTHER): Payer: Medicare Other | Admitting: Nurse Practitioner

## 2023-04-14 ENCOUNTER — Ambulatory Visit: Payer: Medicare Other | Admitting: Nurse Practitioner

## 2023-04-14 VITALS — BP 134/83 | HR 89 | Temp 98.8°F | Wt 139.8 lb

## 2023-04-14 DIAGNOSIS — R5383 Other fatigue: Secondary | ICD-10-CM

## 2023-04-14 DIAGNOSIS — R413 Other amnesia: Secondary | ICD-10-CM

## 2023-04-14 NOTE — Assessment & Plan Note (Signed)
Chronic. Not well controlled.  Suspect it is related to sleep.

## 2023-04-14 NOTE — Patient Instructions (Signed)
PATIENT HAS AN APPOINTMENT WITH [facility] ON 06/17/23 AT 10:30 AM. ADDRESS IS [address], [place].

## 2023-04-14 NOTE — Progress Notes (Signed)
BP 134/83   Pulse 89   Temp 98.8 F (37.1 C) (Oral)   Wt 139 lb 12.8 oz (63.4 kg)   SpO2 96%   BMI 24.00 kg/m    Subjective:    Patient ID: Ralph Leon, male    DOB: [birth year], 87 y.o.   MRN: [id number]  HPI: Ralph Leon is a 87 y.o. male  Chief Complaint  Patient presents with   Hypertension   FATIGUE Patient states he is sleeping a little bit better.  He is still having the same fatigue.  Nothing has changed since I saw him last.  He also feels like he is dribbling out of his mouth some- this is worse in the morning.   Duration:  months Severity: moderate  Onset: gradual Context when symptoms started:  unknown Symptoms improve with rest: no  Depressive symptoms: yes Stress/anxiety: no Insomnia: no  improved slep Snoring: no Observed apnea by bed partner: no Daytime hypersomnolence:yes Wakes feeling refreshed: no History of sleep study: no Dysnea on exertion:  no Orthopnea/PND: no Chest pain: no Chronic cough:  sometimes in the morning Lower extremity edema: no Arthralgias:no Myalgias: no Weakness: yes Rash: no  Relevant past medical, surgical, family and social history reviewed and updated as indicated. Interim medical history since our last visit reviewed. Allergies and medications reviewed and updated.  Review of Systems  Constitutional:  Positive for fatigue.  Respiratory:  Negative for shortness of breath.   Cardiovascular:  Negative for chest pain.  Musculoskeletal:  Negative for arthralgias and myalgias.  Skin:  Negative for rash.  Neurological:  Positive for weakness.       Memory difficulty. Drooling   Psychiatric/Behavioral:  Negative for sleep disturbance.     Per HPI unless specifically indicated above     Objective:    BP 134/83   Pulse 89   Temp 98.8 F (37.1 C) (Oral)   Wt 139 lb 12.8 oz (63.4 kg)   SpO2 96%   BMI 24.00 kg/m   Wt Readings from Last 3 Encounters:  04/14/23 139 lb 12.8 oz (63.4 kg)  03/03/23 140 lb 3.2  oz (63.6 kg)  02/01/23 142 lb (64.4 kg)    Physical Exam Vitals and nursing note reviewed.  Constitutional:      General: He is not in acute distress.    Appearance: Normal appearance. He is not ill-appearing, toxic-appearing or diaphoretic.  HENT:     Head: Normocephalic.     Right Ear: External ear normal.     Left Ear: External ear normal.     Nose: Nose normal. No congestion or rhinorrhea.     Mouth/Throat:     Mouth: Mucous membranes are moist.  Eyes:     General:        Right eye: No discharge.        Left eye: No discharge.     Extraocular Movements: Extraocular movements intact.     Conjunctiva/sclera: Conjunctivae normal.     Pupils: Pupils are equal, round, and reactive to light.  Cardiovascular:     Rate and Rhythm: Normal rate and regular rhythm.     Heart sounds: No murmur heard. Pulmonary:     Effort: Pulmonary effort is normal. No respiratory distress.     Breath sounds: Normal breath sounds. No wheezing, rhonchi or rales.  Abdominal:     General: Abdomen is flat. Bowel sounds are normal.  Musculoskeletal:     Cervical back: Normal range of motion and neck supple.  Skin:    General: Skin is warm and dry.     Capillary Refill: Capillary refill takes less than 2 seconds.  Neurological:     General: No focal deficit present.     Mental Status: He is alert and oriented to person, place, and time.  Psychiatric:        Mood and Affect: Mood normal.        Behavior: Behavior normal.        Thought Content: Thought content normal.        Judgment: Judgment normal.     Results for orders placed or performed in visit on 03/03/23  TSH  Result Value Ref Range   TSH 2.240 0.450 - 4.500 uIU/mL  T4, free  Result Value Ref Range   Free T4 1.19 0.82 - 1.77 ng/dL  Vitamin D (25 hydroxy)  Result Value Ref Range   Vit D, 25-Hydroxy 39.8 30.0 - 100.0 ng/mL  CBC with Differential/Platelet  Result Value Ref Range   WBC 8.3 3.4 - 10.8 x10E3/uL   RBC 4.77 4.14 - 5.80  x10E6/uL   Hemoglobin 13.9 13.0 - 17.7 g/dL   Hematocrit 09.8 11.9 - 51.0 %   MCV 90 79 - 97 fL   MCH 29.1 26.6 - 33.0 pg   MCHC 32.6 31.5 - 35.7 g/dL   RDW 14.7 82.9 - 56.2 %   Platelets 236 150 - 450 x10E3/uL   Neutrophils 65 Not Estab. %   Lymphs 22 Not Estab. %   Monocytes 8 Not Estab. %   Eos 4 Not Estab. %   Basos 1 Not Estab. %   Neutrophils Absolute 5.4 1.4 - 7.0 x10E3/uL   Lymphocytes Absolute 1.8 0.7 - 3.1 x10E3/uL   Monocytes Absolute 0.7 0.1 - 0.9 x10E3/uL   EOS (ABSOLUTE) 0.3 0.0 - 0.4 x10E3/uL   Basophils Absolute 0.1 0.0 - 0.2 x10E3/uL   Immature Granulocytes 0 Not Estab. %   Immature Grans (Abs) 0.0 0.0 - 0.1 x10E3/uL      Assessment & Plan:   Problem List Items Addressed This Visit       Other   Other fatigue    Chronic. Not well controlled.  Suspect it is related to sleep.        Other Visit Diagnoses     Memory deficit    -  Primary   Appointment made for patient at neurology. 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        Follow up plan: Return in about 3 months (around 07/15/2023) for HTN, HLD, DM2 FU.

## 2023-04-23 ENCOUNTER — Other Ambulatory Visit: Payer: Self-pay | Admitting: Nurse Practitioner

## 2023-04-23 NOTE — Telephone Encounter (Signed)
Requested Prescriptions  Pending Prescriptions Disp Refills   lisinopril (ZESTRIL) 20 MG tablet [Pharmacy Med Name: Lisinopril 20 MG Oral Tablet] 90 tablet 1    Sig: Take 1 tablet by mouth once daily     Cardiovascular:  ACE Inhibitors Passed - 04/23/2023 12:12 PM      Passed - Cr in normal range and within 180 days    Creatinine, Ser  Date Value Ref Range Status  02/01/2023 1.11 0.76 - 1.27 mg/dL Final         Passed - K in normal range and within 180 days    Potassium  Date Value Ref Range Status  02/01/2023 4.4 3.5 - 5.2 mmol/L Final         Passed - Patient is not pregnant      Passed - Last BP in normal range    BP Readings from Last 1 Encounters:  04/14/23 134/83         Passed - Valid encounter within last 6 months    Recent Outpatient Visits           1 week ago Memory deficit   [facility] Larae Grooms, NP   1 month ago Other fatigue   [facility] Larae Grooms, NP   2 months ago Aortic atherosclerosis [facility])   [facility] Larae Grooms, NP   5 months ago Aortic atherosclerosis [facility])   [facility] Larae Grooms, NP   7 months ago Other fatigue   [facility] Larae Grooms, NP       Future Appointments             In 2 months Mecum, Oswaldo Conroy, PA-C  [facility]

## 2023-07-15 ENCOUNTER — Encounter: Payer: Self-pay | Admitting: Physician Assistant

## 2023-07-15 ENCOUNTER — Ambulatory Visit (INDEPENDENT_AMBULATORY_CARE_PROVIDER_SITE_OTHER): Payer: Medicare Other | Admitting: Physician Assistant

## 2023-07-15 VITALS — BP 169/74 | HR 80 | Wt 138.0 lb

## 2023-07-15 DIAGNOSIS — R7301 Impaired fasting glucose: Secondary | ICD-10-CM

## 2023-07-15 DIAGNOSIS — E7800 Pure hypercholesterolemia, unspecified: Secondary | ICD-10-CM

## 2023-07-15 DIAGNOSIS — J449 Chronic obstructive pulmonary disease, unspecified: Secondary | ICD-10-CM

## 2023-07-15 DIAGNOSIS — I10 Essential (primary) hypertension: Secondary | ICD-10-CM

## 2023-07-15 DIAGNOSIS — R5383 Other fatigue: Secondary | ICD-10-CM | POA: Diagnosis not present

## 2023-07-15 DIAGNOSIS — E559 Vitamin D deficiency, unspecified: Secondary | ICD-10-CM

## 2023-07-15 MED ORDER — HYDROCHLOROTHIAZIDE 12.5 MG PO TABS: 12.5000 mg | ORAL_TABLET | Freq: Every day | ORAL | 1 refills | Status: DC

## 2023-07-15 NOTE — Progress Notes (Signed)
Established Patient Office Visit  Name: Ralph Leon   MRN: [id number]    DOB: [birth year]   Date:07/16/2023  Today's Provider: Jacquelin Hawking, MHS, PA-C Introduced myself to the patient as a PA-C and provided education on APPs in clinical practice.         Subjective  Chief Complaint  Chief Complaint  Patient presents with   Hyperlipidemia   Hypertension    Patient says he has noticed an elevation in his BP readings the past few days and says he has been taking his medication.    Fatigue    Patient says he is not doing lately, and says he is feeling tired a lot lately and says it has been an ongoing issue, but seems to be getting worse. Patient says he feels he is sleeping more than usual. Patient says he has trouble falling asleep at night, but has trouble getting up in the mornings.     HPI    He reports he is still feeling tired a lot  States he was busy and active yesterday and now feels more tired  He states he is having some trouble falling asleep at night  In the AM he sometimes has trouble getting out of bed - getting arms and legs up to get himself physically out of bed  He reports going to bed around 10;30-11 but it's sometimes several hours before he falls asleep    HYPERTENSION / HYPERLIPIDEMIA Satisfied with current treatment? yes he has been taking Lisinopril daily as directed  Duration of hypertension: years BP monitoring frequency: daily BP range: 160s/80s  BP medication side effects: no Past BP meds: Lisinopril 20 mg PO every day  Duration of hyperlipidemia: years Cholesterol medication side effects: no Cholesterol supplements: none Past cholesterol medications: atorvastain (lipitor) Medication compliance: good compliance Aspirin: yes Recent stressors: no Recurrent headaches: no Visual changes: no Palpitations: no Dyspnea: no Chest pain: no Lower extremity edema: no Dizzy/lightheaded: yes- sometimes   COPD COPD status: stable Satisfied  with current treatment?: yes Oxygen use: no Dyspnea frequency: not often  Cough frequency: not much  Rescue inhaler frequency:   Limitation of activity: no- some SOBOE when walking more than usual  Productive cough: none  Last Spirometry:  Pneumovax: Not up to Date Influenza: Not up to Date  Prediabetes Previous A1c was 6.1  Not on medications at this time      Patient Active Problem List   Diagnosis Date Noted   Vitamin D deficiency 11/02/2022   Other fatigue 08/31/2022   Stroke (HCC)    Fall 04/04/2021   Aortic atherosclerosis (HCC) 02/12/2021   Hypercholesteremia 01/20/2021   Coronary artery disease 01/20/2021   IFG (impaired fasting glucose) 07/08/2020   Essential hypertension 03/03/2020   History of CVA (cerebrovascular accident) 03/03/2020   COPD (chronic obstructive pulmonary disease) (HCC)     History reviewed. No pertinent surgical history.  Family History  Problem Relation Age of Onset   Diabetes Mother    Heart attack Father     Social History   Tobacco Use   Smoking status: Every Day    Current packs/day: 0.25    Average packs/day: 0.3 packs/day for 64.0 years (16.0 ttl pk-yrs)    Types: Cigarettes   Smokeless tobacco: Never   Tobacco comments:    5-6 a day. 03/06/2021  Substance Use Topics   Alcohol use: Not Currently     Current Outpatient Medications:    aspirin EC  81 MG tablet, Take 1 tablet (81 mg total) by mouth daily. Swallow whole., Disp: 90 tablet, Rfl: 3   hydrochlorothiazide (HYDRODIURIL) 12.5 MG tablet, Take 1 tablet (12.5 mg total) by mouth daily., Disp: 30 tablet, Rfl: 1   lisinopril (ZESTRIL) 20 MG tablet, Take 1 tablet by mouth once daily, Disp: 90 tablet, Rfl: 1   atorvastatin (LIPITOR) 40 MG tablet, Take 1 tablet (40 mg total) by mouth daily., Disp: 30 tablet, Rfl: 5  No Known Allergies  I personally reviewed active problem list, medication list, allergies, notes from last encounter, lab results with the patient/caregiver  today.   Review of Systems  Constitutional:  Positive for malaise/fatigue.  Respiratory:  Negative for shortness of breath and wheezing.   Cardiovascular:  Negative for chest pain, palpitations and leg swelling.  Neurological:  Positive for dizziness. Negative for tingling, tremors and headaches.      Objective  Vitals:   07/15/23 1429  BP: (!) 169/74  Pulse: 80  SpO2: 100%  Weight: 138 lb (62.6 kg)    Body mass index is 23.69 kg/m.  Physical Exam Vitals reviewed.  Constitutional:      General: He is awake.     Appearance: Normal appearance. He is well-developed and well-groomed.  HENT:     Head: Normocephalic and atraumatic.  Cardiovascular:     Rate and Rhythm: Normal rate and regular rhythm.     Heart sounds: Normal heart sounds. No murmur heard.    No friction rub. No gallop.  Pulmonary:     Effort: Pulmonary effort is normal.     Breath sounds: Decreased air movement present. Decreased breath sounds present.  Musculoskeletal:     Cervical back: Normal range of motion.  Neurological:     Mental Status: He is alert.  Psychiatric:        Attention and Perception: Attention normal.        Mood and Affect: Mood normal.        Speech: Speech normal.        Behavior: Behavior normal. Behavior is cooperative.      Recent Results (from the past 2160 hour(s))  Vitamin D (25 hydroxy)     Status: None   Collection Time: 07/15/23  3:15 PM  Result Value Ref Range   Vit D, 25-Hydroxy 34.5 30.0 - 100.0 ng/mL    Comment: Vitamin D deficiency has been defined by the Institute of Medicine and an Endocrine Society practice guideline as a level of serum 25-OH vitamin D less than 20 ng/mL (1,2). The Endocrine Society went on to further define vitamin D insufficiency as a level between 21 and 29 ng/mL (2). 1. IOM (Institute of Medicine). 2010. Dietary reference    intakes for calcium and D. [place]: The    [employer]. 2. Holick MF, Binkley [place],  Bischoff-Ferrari HA, et al.    Evaluation, treatment, and prevention of vitamin D    deficiency: an Endocrine Society clinical practice    guideline. JCEM. 2011 Jul; 96(7):1911-30.   Lipid Profile     Status: Abnormal   Collection Time: 07/15/23  3:15 PM  Result Value Ref Range   Cholesterol, Total 225 (H) 100 - 199 mg/dL   Triglycerides 253 0 - 149 mg/dL   HDL 69 >66 mg/dL   VLDL Cholesterol Cal 18 5 - 40 mg/dL   LDL Chol Calc (NIH) 440 (H) 0 - 99 mg/dL   Chol/HDL Ratio 3.3 0.0 - 5.0 ratio    Comment:  T. Chol/HDL Ratio                                             Men  Women                               1/2 Avg.Risk  3.4    3.3                                   Avg.Risk  5.0    4.4                                2X Avg.Risk  9.6    7.1                                3X Avg.Risk 23.4   11.0   CBC w/Diff     Status: None   Collection Time: 07/15/23  3:15 PM  Result Value Ref Range   WBC 8.1 3.4 - 10.8 x10E3/uL   RBC 4.69 4.14 - 5.80 x10E6/uL   Hemoglobin 13.4 13.0 - 17.7 g/dL   Hematocrit 82.9 56.2 - 51.0 %   MCV 89 79 - 97 fL   MCH 28.6 26.6 - 33.0 pg   MCHC 32.1 31.5 - 35.7 g/dL   RDW 13.0 86.5 - 78.4 %   Platelets 232 150 - 450 x10E3/uL   Neutrophils 67 Not Estab. %   Lymphs 20 Not Estab. %   Monocytes 9 Not Estab. %   Eos 3 Not Estab. %   Basos 1 Not Estab. %   Neutrophils Absolute 5.4 1.4 - 7.0 x10E3/uL   Lymphocytes Absolute 1.7 0.7 - 3.1 x10E3/uL   Monocytes Absolute 0.7 0.1 - 0.9 x10E3/uL   EOS (ABSOLUTE) 0.3 0.0 - 0.4 x10E3/uL   Basophils Absolute 0.0 0.0 - 0.2 x10E3/uL   Immature Granulocytes 0 Not Estab. %   Immature Grans (Abs) 0.0 0.0 - 0.1 x10E3/uL  Comp Met (CMET)     Status: Abnormal   Collection Time: 07/15/23  3:15 PM  Result Value Ref Range   Glucose 107 (H) 70 - 99 mg/dL   BUN 17 8 - 27 mg/dL   Creatinine, Ser 6.96 0.76 - 1.27 mg/dL   eGFR 67 >29 BM/WUX/3.24   BUN/Creatinine Ratio 16 10 - 24   Sodium 142 134 -  144 mmol/L   Potassium 4.4 3.5 - 5.2 mmol/L   Chloride 102 96 - 106 mmol/L   CO2 26 20 - 29 mmol/L   Calcium 9.5 8.6 - 10.2 mg/dL   Total Protein 6.8 6.0 - 8.5 g/dL   Albumin 4.3 3.7 - 4.7 g/dL   Globulin, Total 2.5 1.5 - 4.5 g/dL   Bilirubin Total 0.5 0.0 - 1.2 mg/dL   Alkaline Phosphatase 87 44 - 121 IU/L   AST 14 0 - 40 IU/L   ALT 6 0 - 44 IU/L  HgB A1c     Status: Abnormal   Collection Time: 07/15/23  3:15 PM  Result Value Ref Range   Hgb A1c MFr Bld 6.2 (H) 4.8 - 5.6 %    Comment:  Prediabetes: 5.7 - 6.4          Diabetes: >6.4          Glycemic control for adults with diabetes: <7.0    Est. average glucose Bld gHb Est-mCnc 131 mg/dL  O35     Status: None   Collection Time: 07/15/23  3:15 PM  Result Value Ref Range   Vitamin B-12 340 232 - 1,245 pg/mL     PHQ2/9:    07/15/2023    2:32 PM 04/14/2023    2:29 PM 03/03/2023    2:42 PM 02/01/2023    2:33 PM 02/01/2023    2:06 PM  Depression screen PHQ 2/9  Decreased Interest 1 2 0 1 1  Down, Depressed, Hopeless 0 0 0 0 0  PHQ - 2 Score 1 2 0 1 1  Altered sleeping 2 2 1 1 2   Tired, decreased energy 2 2 1 1 2   Change in appetite 2 2 1 1 2   Feeling bad or failure about yourself  0 0 0 0 0  Trouble concentrating 2 2 1 1 1   Moving slowly or fidgety/restless 2 2 0 0 2  Suicidal thoughts 0 0 0 0 0  PHQ-9 Score 11 12 4 5 10   Difficult doing work/chores Somewhat difficult Somewhat difficult Somewhat difficult Somewhat difficult Somewhat difficult      Fall Risk:    07/15/2023    2:33 PM 04/14/2023    2:28 PM 02/01/2023    2:36 PM 02/01/2023    2:05 PM 11/02/2022    2:04 PM  Fall Risk   Falls in the past year? 0 0 0 0 0  Number falls in past yr: 0 0 0 0 0  Injury with Fall? 0 0 0 0 0  Risk for fall due to : No Fall Risks No Fall Risks No Fall Risks No Fall Risks No Fall Risks  Follow up Falls evaluation completed Falls evaluation completed Falls prevention discussed;Falls evaluation completed Falls evaluation  completed Falls evaluation completed      Functional Status Survey:      Assessment & Plan  Problem List Items Addressed This Visit       Cardiovascular and Mediastinum   Essential hypertension    Chronic, historic condition Appears currently exacerbated and he reports elevated BP measures at home He is currently taking lisinopril 20 mg p.o. daily and appears to be tolerating well. Will add HCTZ 12.5 mg p.o. daily to regimen to assist with current elevations Recommend that he continues to take measures at home for review at follow-up Follow-up in 4 weeks or sooner if concerns arise      Relevant Medications   hydrochlorothiazide (HYDRODIURIL) 12.5 MG tablet   Other Relevant Orders   CBC w/Diff (Completed)   Comp Met (CMET) (Completed)     Respiratory   COPD (chronic obstructive pulmonary disease) (HCC)    Chronic, historic condition Appears well-controlled at this time.  He does not appear to be taking any medications or inhalers. He states that he only occasionally has shortness of breath on exertion when he is walking for prolonged periods.  He denies frequent coughing, productive cough, oxygen use. Continue current regimen Follow-up as needed for persistent or progressing symptoms        Endocrine   IFG (impaired fasting glucose)    Recheck labs and A1c today-results to take further management      Relevant Orders   HgB A1c (Completed)     Other  Hypercholesteremia    Chronic, historical condition Appears well-controlled at this time Recheck lipid panel today-results to dictate further management For now continue current regimen comprised of atorvastatin 40 mg p.o. daily Follow-up in 6 months or sooner if concerns arise      Relevant Medications   hydrochlorothiazide (HYDRODIURIL) 12.5 MG tablet   Other Relevant Orders   Lipid Profile (Completed)   Other fatigue - Primary    Appears to be chronic Will check CBC, CMP, B12, vitamin D for potential  etiology Patient does report issues with falling asleep so suspect that this might also be a factor Will provide information regarding sleep hygiene and recommend that he tries taking melatonin 5 to 10 mg p.o. nightly for assistance with sleep initiation Follow-up in about 4 weeks or sooner if concerns arise      Relevant Orders   Vitamin D (25 hydroxy) (Completed)   B12 (Completed)   Vitamin D deficiency    Recheck labs today-results to dictate further management      Relevant Orders   Vitamin D (25 hydroxy) (Completed)     Return in about 4 weeks (around 08/12/2023) for HTN.   I, Brilyn Tuller E Kyle Stansell, PA-C, have reviewed all documentation for this visit. The documentation on 07/16/23 for the exam, diagnosis, procedures, and orders are all accurate and complete.   Jacquelin Hawking, MHS, PA-C [facility]

## 2023-07-15 NOTE — Patient Instructions (Addendum)
Sleep hygiene (Practices to help improve sleep)  *Try to go to bed at the same time every night *Make your room as dark and quiet as possible for sleep (ambient noises or sleep machine is okay too) *Try to establish a bedtime routine before bed (shower, reading for a little bit, etc.) *No TVs or phone for at least to an hour before bed. (The blue light from these devices can affect your sleep-wake cycle and make it harder to go to sleep) *Avoid caffeine in the afternoon and especially before bedtime.  The goal is to establish a relaxing environment and routine that signals your body that it is time to go to sleep.    I recommend that you try taking Melatonin to help with sleep. Try taking 5 mg per night to start with and we can see how that works for you   Your blood pressure was mildly elevated today.  If possible please take it at home using an electronic blood pressure cuff for the upper arm Record your blood pressure once per day and bring them back with you to your apt so we can make sure you are not developing high blood pressure.

## 2023-07-16 LAB — COMPREHENSIVE METABOLIC PANEL
ALT: 6 IU/L (ref 0–44)
AST: 14 IU/L (ref 0–40)
Albumin: 4.3 g/dL (ref 3.7–4.7)
Alkaline Phosphatase: 87 IU/L (ref 44–121)
BUN/Creatinine Ratio: 16 (ref 10–24)
BUN: 17 mg/dL (ref 8–27)
Bilirubin Total: 0.5 mg/dL (ref 0.0–1.2)
CO2: 26 mmol/L (ref 20–29)
Calcium: 9.5 mg/dL (ref 8.6–10.2)
Chloride: 102 mmol/L (ref 96–106)
Creatinine, Ser: 1.09 mg/dL (ref 0.76–1.27)
Globulin, Total: 2.5 g/dL (ref 1.5–4.5)
Glucose: 107 mg/dL — ABNORMAL HIGH (ref 70–99)
Potassium: 4.4 mmol/L (ref 3.5–5.2)
Sodium: 142 mmol/L (ref 134–144)
Total Protein: 6.8 g/dL (ref 6.0–8.5)
eGFR: 67 mL/min/{1.73_m2} (ref >59)

## 2023-07-16 LAB — CBC WITH DIFFERENTIAL/PLATELET
Basophils Absolute: 0 10*3/uL (ref 0.0–0.2)
Basos: 1 %
EOS (ABSOLUTE): 0.3 10*3/uL (ref 0.0–0.4)
Eos: 3 %
Hematocrit: 41.8 % (ref 37.5–51.0)
Hemoglobin: 13.4 g/dL (ref 13.0–17.7)
Immature Grans (Abs): 0 10*3/uL (ref 0.0–0.1)
Immature Granulocytes: 0 %
Lymphocytes Absolute: 1.7 10*3/uL (ref 0.7–3.1)
Lymphs: 20 %
MCH: 28.6 pg (ref 26.6–33.0)
MCHC: 32.1 g/dL (ref 31.5–35.7)
MCV: 89 fL (ref 79–97)
Monocytes Absolute: 0.7 10*3/uL (ref 0.1–0.9)
Monocytes: 9 %
Neutrophils Absolute: 5.4 10*3/uL (ref 1.4–7.0)
Neutrophils: 67 %
Platelets: 232 10*3/uL (ref 150–450)
RBC: 4.69 x10E6/uL (ref 4.14–5.80)
RDW: 13.5 % (ref 11.6–15.4)
WBC: 8.1 10*3/uL (ref 3.4–10.8)

## 2023-07-16 LAB — HEMOGLOBIN A1C
Est. average glucose Bld gHb Est-mCnc: 131 mg/dL
Hgb A1c MFr Bld: 6.2 % — ABNORMAL HIGH (ref 4.8–5.6)

## 2023-07-16 LAB — VITAMIN D 25 HYDROXY (VIT D DEFICIENCY, FRACTURES): Vit D, 25-Hydroxy: 34.5 ng/mL (ref 30.0–100.0)

## 2023-07-16 LAB — VITAMIN B12: Vitamin B-12: 340 pg/mL (ref 232–1245)

## 2023-07-16 LAB — LIPID PANEL
Chol/HDL Ratio: 3.3 ratio (ref 0.0–5.0)
Cholesterol, Total: 225 mg/dL — ABNORMAL HIGH (ref 100–199)
HDL: 69 mg/dL (ref >39)
LDL Chol Calc (NIH): 138 mg/dL — ABNORMAL HIGH (ref 0–99)
Triglycerides: 105 mg/dL (ref 0–149)
VLDL Cholesterol Cal: 18 mg/dL (ref 5–40)

## 2023-07-16 NOTE — Assessment & Plan Note (Signed)
Chronic, historic condition Appears well-controlled at this time.  He does not appear to be taking any medications or inhalers. He states that he only occasionally has shortness of breath on exertion when he is walking for prolonged periods.  He denies frequent coughing, productive cough, oxygen use. Continue current regimen Follow-up as needed for persistent or progressing symptoms

## 2023-07-16 NOTE — Assessment & Plan Note (Signed)
Appears to be chronic Will check CBC, CMP, B12, vitamin D for potential etiology Patient does report issues with falling asleep so suspect that this might also be a factor Will provide information regarding sleep hygiene and recommend that he tries taking melatonin 5 to 10 mg p.o. nightly for assistance with sleep initiation Follow-up in about 4 weeks or sooner if concerns arise

## 2023-07-16 NOTE — Assessment & Plan Note (Signed)
Chronic, historical condition Appears well-controlled at this time Recheck lipid panel today-results to dictate further management For now continue current regimen comprised of atorvastatin 40 mg p.o. daily Follow-up in 6 months or sooner if concerns arise

## 2023-07-16 NOTE — Assessment & Plan Note (Signed)
Chronic, historic condition Appears currently exacerbated and he reports elevated BP measures at home He is currently taking lisinopril 20 mg p.o. daily and appears to be tolerating well. Will add HCTZ 12.5 mg p.o. daily to regimen to assist with current elevations Recommend that he continues to take measures at home for review at follow-up Follow-up in 4 weeks or sooner if concerns arise

## 2023-07-16 NOTE — Assessment & Plan Note (Signed)
Recheck labs and A1c today-results to take further management

## 2023-07-16 NOTE — Assessment & Plan Note (Signed)
 Recheck labs today- results to dictate further management

## 2023-07-20 MED ORDER — VITAMIN D (ERGOCALCIFEROL) 1.25 MG (50000 UNIT) PO CAPS: 50000.0000 [IU] | ORAL_CAPSULE | ORAL | 0 refills | Status: DC

## 2023-07-20 NOTE — Progress Notes (Signed)
Your cholesterol was a bit elevated - please try to reduce your saturated fat intake Your electrolytes, liver and kidney function were in normal limits Your A1c was stable at this time Your Vitamin D was a bit low - I have sent in a script for a weekly supplement for you to take to help correct this. Please take it once per week for the next 12 weeks and we will recheck your levels at your next apt Your CBC was normal - no evidence of anemia Your B12 was in normal range but a bit on the lower side. We can start supplement to help with this. This may help with your fatigue. Let us know if you would like to do a daily oral supplement or injections.

## 2023-07-20 NOTE — Addendum Note (Signed)
Addended by: Jacquelin Hawking on: 07/20/2023 08:38 PM   Modules accepted: Orders

## 2023-07-24 IMAGING — MR MR HEAD WO/W CM
13 series · 48 of 48 positions shown · IV contrast (gadavist)
Comparison: CT head 12/05/2012

CLINICAL DATA: History of CVA, complaining of difficulty talking at
times, weakness, memory changes

EXAM:
MRI HEAD WITHOUT AND WITH CONTRAST
TECHNIQUE: Multiplanar, multiecho pulse sequences of the brain and surrounding
structures were obtained without and with intravenous contrast.
CONTRAST:  6mL GADAVIST GADOBUTROL 1 MMOL/ML IV SOLN

[Series 5: ax dwi_tracew · axial · 3.0mm · 0.71mm/px · z∈[-124,+38]mm · 4 of 56 slices shown]
[im 1/56]
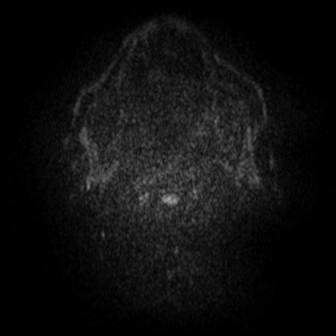
[im 19/56]
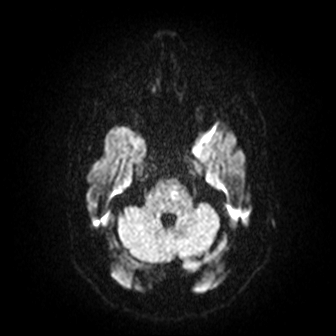
[im 37/56]
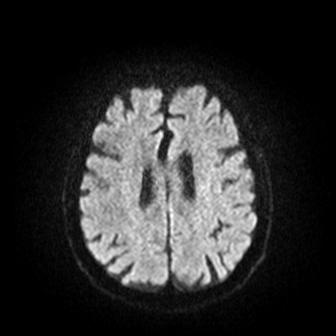
[im 56/56]
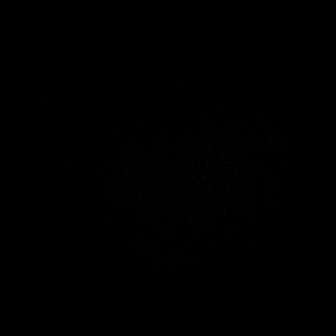

[Series 6: ax dwi_adc · axial · 3.0mm · 0.71mm/px · z∈[-124,+35]mm · 4 of 55 slices shown]
[im 1/55]
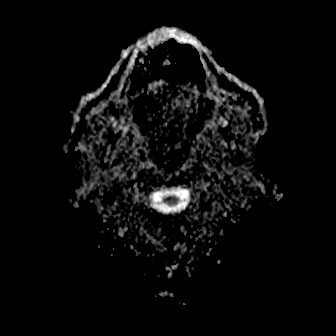
[im 19/55]
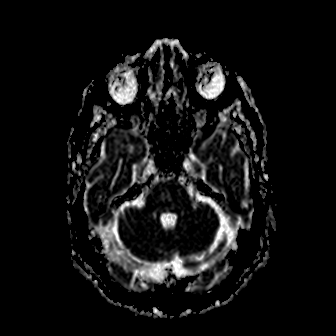
[im 37/55]
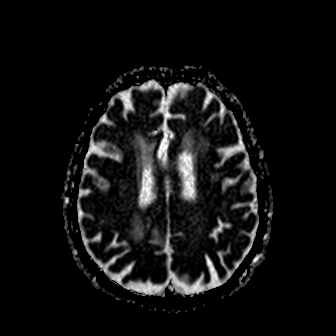
[im 55/55]
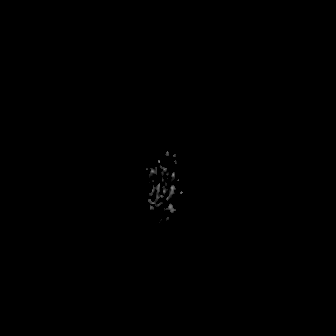

[Series 7: cor dwi_tracew · coronal · 5.0mm · 0.68mm/px · 3 of 40 slices shown]
[im 1/40]
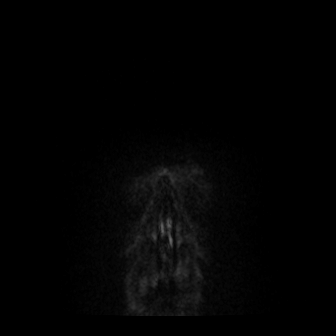
[im 20/40]
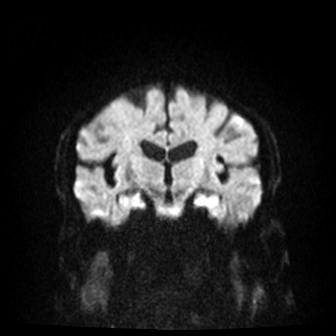
[im 40/40]
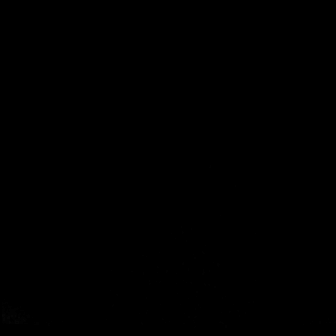

[Series 8: cor dwi_adc · coronal · 5.0mm · 0.68mm/px · 2 of 39 slices shown]
[im 1/39]
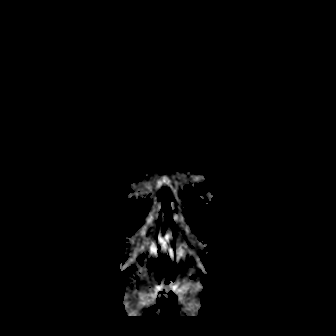
[im 39/39]
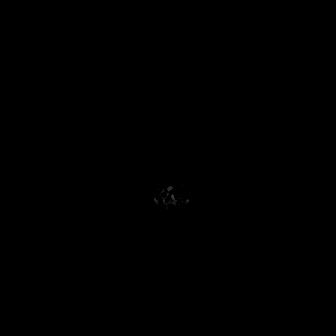

[Series 10: T2 · axial · 5.0mm · 0.86mm/px · 1 of 25 slices shown (1 of 2)]
[im 1/25]
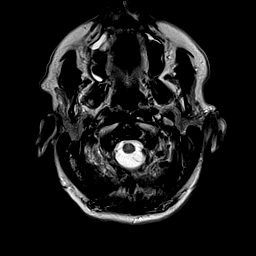

[Series 12: pha_images · axial · 3.0mm · 0.90mm/px · z∈[-110,+37]mm · 3 of 51 slices shown]
[im 1/51]
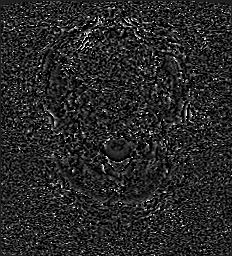
[im 26/51]
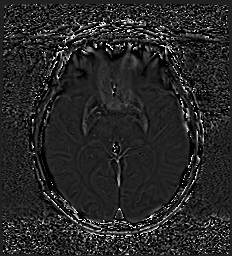
[im 51/51]
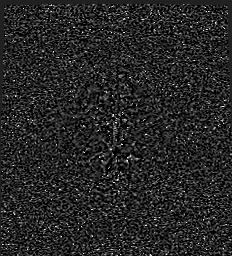

[Series 13: swi_images · axial · 3.0mm · 0.90mm/px · z∈[-110,+40]mm · 3 of 52 slices shown]
[im 1/52]
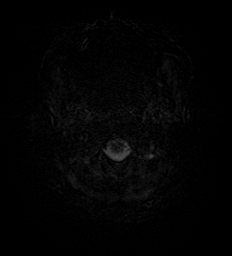
[im 26/52]
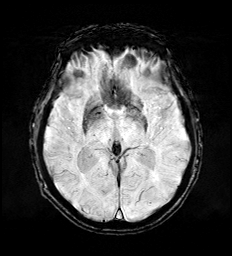
[im 52/52]
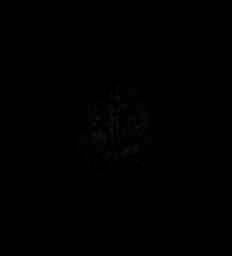

[Series 15: FLAIR · axial · 3.0mm · 0.69mm/px · z∈[-114,+45]mm · 3 of 55 slices shown]
[im 1/55]
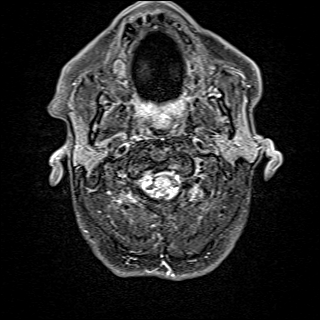
[im 28/55]
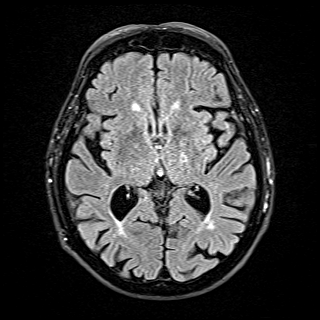
[im 55/55]
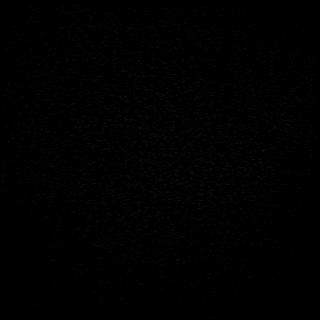

[Series 16: T1 · axial · 1.0mm · 0.98mm/px · z∈[-128,+44]mm · 10 of 176 slices shown (1 of 2)]
[im 1/176]
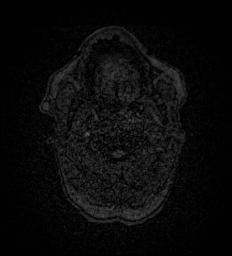
[im 20/176]
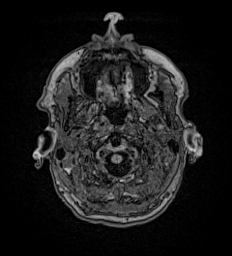
[im 39/176]
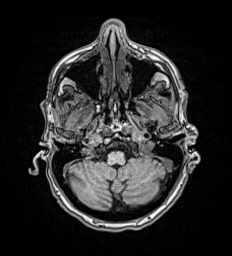
[im 59/176]
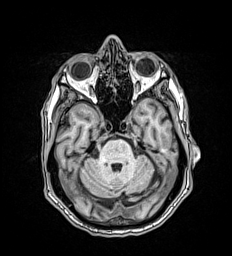
[im 78/176]
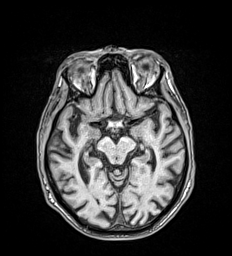
[im 98/176]
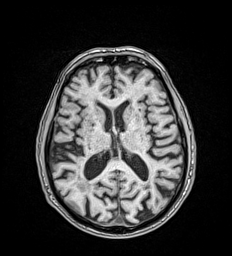
[im 117/176]
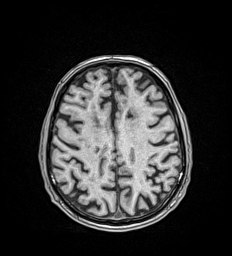
[im 137/176]
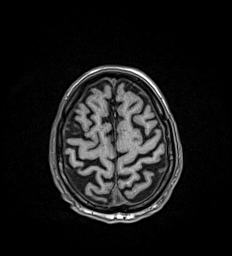
[im 156/176]
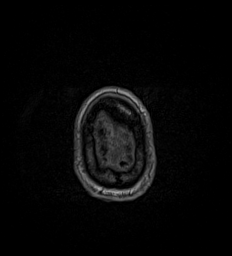
[im 176/176]
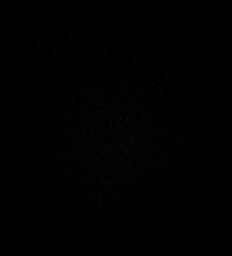

[Series 17: T1 · sagittal · 5.0mm · 0.62mm/px · 1 of 23 slices shown (2 of 2)]
[im 1/23]
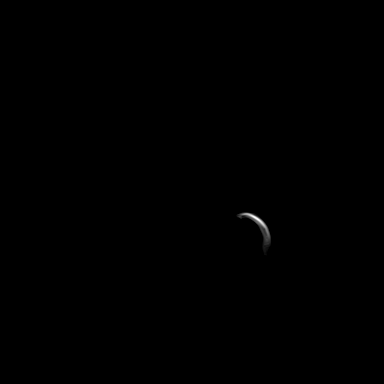

[Series 18: T2 · coronal · 5.0mm · 0.86mm/px · 2 of 30 slices shown (2 of 2)]
[im 1/30]
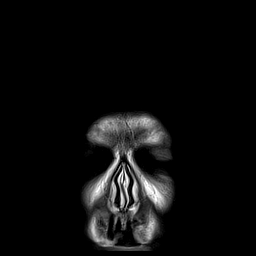
[im 30/30]
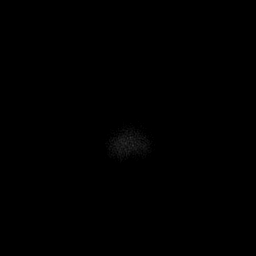

[Series 19: T1 post-contrast · axial · 1.0mm · 0.98mm/px · z∈[-128,+44]mm · 10 of 175 slices shown (1 of 2)]
[im 1/175]
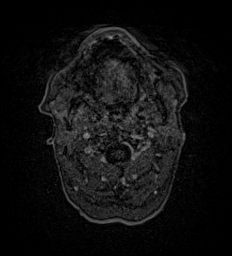
[im 20/175]
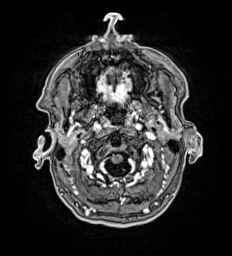
[im 39/175]
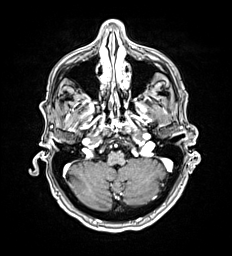
[im 59/175]
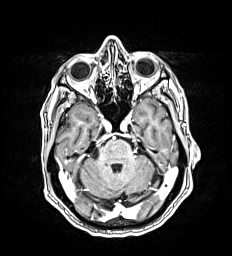
[im 78/175]
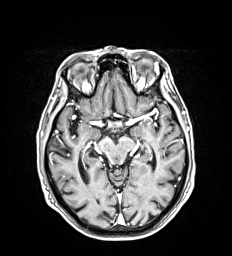
[im 97/175]
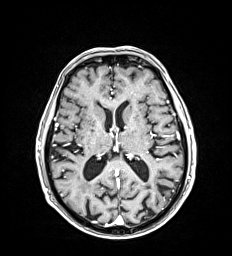
[im 117/175]
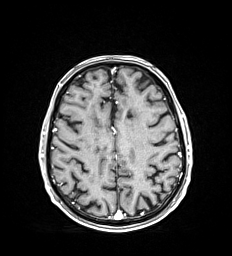
[im 136/175]
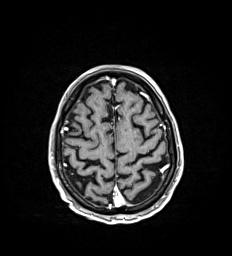
[im 155/175]
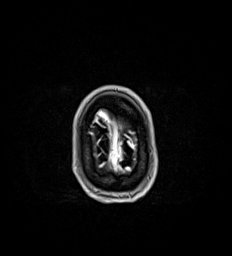
[im 175/175]
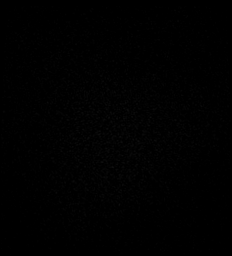

[Series 20: T1 post-contrast · coronal · 5.0mm · 0.57mm/px · 2 of 30 slices shown (2 of 2)]
[im 1/30]
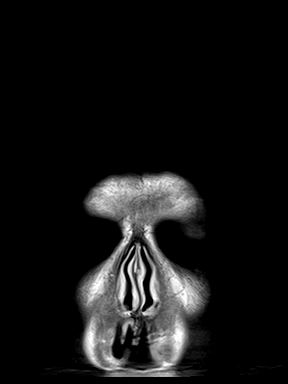
[im 30/30]
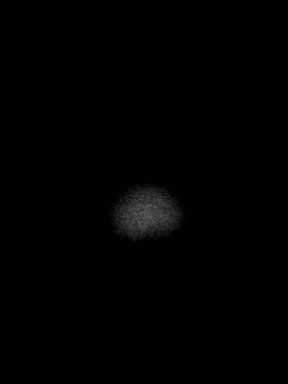

[48 of 48 positions shown; findings below may reference images not displayed]

FINDINGS: Brain: There is a small focus of diffusion restriction with faint
associated T2/FLAIR signal abnormality in the left pons consistent
with acute infarct. There is no associated hemorrhage. No other
acute infarct is identified. There is no acute intracranial
hemorrhage or extra-axial fluid collection.

There is a background of mild-to-moderate global parenchymal volume
loss with commensurate enlargement of the ventricular system and
extra-axial CSF spaces. Multiple small remote infarcts are seen in
the bilateral periventricular white matter, thalami, right pons, and
basal ganglia. Additional patchy FLAIR signal abnormality in the
subcortical and periventricular white matter likely reflects sequela
of chronic white matter microangiopathy.

An empty sella is incidentally noted. There is no abnormal
enhancement or mass lesion. There is no midline shift.

Vascular: Normal flow voids.

Skull and upper cervical spine: Normal marrow signal.

Sinuses/Orbits: There is mild mucosal thickening in the paranasal
sinuses. The globes and orbits are unremarkable.

Other: None.
IMPRESSION: 1. Small acute infarct in the left pons.
2. Multiple additional remote infarcts in the bilateral cerebral
hemispheres, deep gray nuclei, and right pons.
3. Mild-to-moderate global parenchymal volume loss and chronic white
matter microangiopathy.

These results will be called to the ordering clinician or
representative by the Radiologist Assistant, and communication
documented in the PACS or [REDACTED].

## 2023-08-12 ENCOUNTER — Ambulatory Visit (INDEPENDENT_AMBULATORY_CARE_PROVIDER_SITE_OTHER): Payer: Medicare Other | Admitting: Physician Assistant

## 2023-08-12 ENCOUNTER — Encounter: Payer: Self-pay | Admitting: Physician Assistant

## 2023-08-12 VITALS — BP 121/79 | HR 91 | Temp 97.9°F | Wt 137.8 lb

## 2023-08-12 DIAGNOSIS — I10 Essential (primary) hypertension: Secondary | ICD-10-CM

## 2023-08-12 DIAGNOSIS — R5383 Other fatigue: Secondary | ICD-10-CM | POA: Diagnosis not present

## 2023-08-12 DIAGNOSIS — I2510 Atherosclerotic heart disease of native coronary artery without angina pectoris: Secondary | ICD-10-CM

## 2023-08-12 MED ORDER — ATORVASTATIN CALCIUM 40 MG PO TABS: 40.0000 mg | ORAL_TABLET | Freq: Every day | ORAL | 1 refills | Status: DC

## 2023-08-12 NOTE — Assessment & Plan Note (Addendum)
Appears chronic, ongoing He reports this has been going on for about a year Correcting Vitamin D since last apt and he has started B12 supplement at home- recheck at follow up  Could be related to sleep- seems to have sleep initiation issues but has not tried Melatonin yet He has been on lisinopril for about 3 years and fatigue started about a year ago (may need to consider changing to ARB to see if this improved fatigue) He is followed by Cardiology - echo to be repeated in Oct Recommend follow up in about 2 month to see how he doing with higher Vitamin D and trying melatonin

## 2023-08-12 NOTE — Progress Notes (Signed)
Acute Office Visit   Patient: Ralph Leon   DOB: Sep 14, 1936   87 y.o. Male  MRN: [id number] Visit Date: 08/12/2023  Today's healthcare provider: Oswaldo Conroy Jaire Pinkham, PA-C  Introduced myself to the patient as a Secondary school teacher and provided education on APPs in clinical practice.    Chief Complaint  Patient presents with   Hyperlipidemia   Hypertension   Subjective    HPI    Hypertension: - Medications: Lisinopril 20 mg PO every day, hydrochlorothiazide 12.5 mg PO every day  - Compliance: good  - Checking BP at home: yes he has been taking at home and brought records today  Results are improved since starting hydrochlorothiazide- now BP at home is averaging in 110s/70s  - Denies any SOB, CP, vision changes, LE edema, medication SEs, or symptoms of hypotension  He reports he is still not feeling good Reports fatigue started about a year ago  States he has still been feeling tired  He reports fatigue seems to be getting worse  He states he is still having trouble falling asleep and in the AM he has to "fight to get out of bed" like he has trouble moving his extremities  He denies stiffness but once he gets his feet on the floor he is able to get going  He thinks he is sleeping okay but is not sure - states he dreams a lot but this doesn't bother him  He has not started Melatonin yet   He has started B12 oral supplements and has been taking Vitamin D as directed    Medications: Outpatient Medications Prior to Visit  Medication Sig   aspirin EC 81 MG tablet Take 1 tablet (81 mg total) by mouth daily. Swallow whole.   hydrochlorothiazide (HYDRODIURIL) 12.5 MG tablet Take 1 tablet (12.5 mg total) by mouth daily.   lisinopril (ZESTRIL) 20 MG tablet Take 1 tablet by mouth once daily   Vitamin D, Ergocalciferol, (DRISDOL) 1.25 MG (50000 UNIT) CAPS capsule Take 1 capsule (50,000 Units total) by mouth every 7 (seven) days.   [DISCONTINUED] atorvastatin (LIPITOR) 40 MG tablet Take 1  tablet (40 mg total) by mouth daily.   No facility-administered medications prior to visit.    Review of Systems  Constitutional:  Positive for fatigue.  Eyes:  Negative for visual disturbance.  Respiratory:  Negative for shortness of breath and wheezing.   Cardiovascular:  Negative for chest pain, palpitations and leg swelling.  Neurological:  Positive for light-headedness. Negative for dizziness, facial asymmetry and headaches.        Objective    BP 121/79   Pulse 91   Temp 97.9 F (36.6 C) (Oral)   Wt 137 lb 12.8 oz (62.5 kg)   SpO2 97%   BMI 23.65 kg/m     Physical Exam Vitals reviewed.  Constitutional:      General: He is awake. He is not in acute distress.    Appearance: Normal appearance. He is well-developed and well-groomed.     Comments: Frail appearance   HENT:     Head: Normocephalic and atraumatic.  Cardiovascular:     Rate and Rhythm: Normal rate and regular rhythm.     Pulses: Normal pulses.     Heart sounds: Heart sounds are distant.  Pulmonary:     Effort: Pulmonary effort is normal.     Breath sounds: Normal breath sounds. No decreased air movement. No wheezing, rhonchi or rales.  Musculoskeletal:  Cervical back: Normal range of motion.     Right lower leg: No edema.     Left lower leg: No edema.  Skin:    General: Skin is warm and dry.  Neurological:     Mental Status: He is alert.  Psychiatric:        Attention and Perception: Attention normal.        Mood and Affect: Mood normal.        Speech: Speech normal.        Behavior: Behavior normal. Behavior is cooperative.       No results found for any visits on 08/12/23.  Assessment & Plan      Return in about 2 months (around 10/12/2023) for fatigue, HTN, HLD, COPD.       Problem List Items Addressed This Visit       Cardiovascular and Mediastinum   Essential hypertension - Primary    Chronic, historic condition Currently managed with Lisinopril 20 mg PO every day,  hydrochlorothiazide 12.5 mg PO every day Appears to be tolerating well Reviewed home BP log- he is checking 3x/day  BP appears to have improved since adding hydrochlorothiazide - will need to continue monitoring CMP as he has continued concerns for fatigue Follow up in 3 months or sooner if concerns arise        Relevant Medications   atorvastatin (LIPITOR) 40 MG tablet   Coronary artery disease    Chronic, historic condition Will restart Atorvastatin as he said he has not had any for some time Continue aspirin as well Continue Cardiology collaboration  Follow up in 3 months or sooner if concerns arise        Relevant Medications   atorvastatin (LIPITOR) 40 MG tablet     Other   Other fatigue    Appears chronic, ongoing He reports this has been going on for about a year Correcting Vitamin D since last apt and he has started B12 supplement at home- recheck at follow up  Could be related to sleep- seems to have sleep initiation issues but has not tried Melatonin yet He has been on lisinopril for about 3 years and fatigue started about a year ago (may need to consider changing to ARB to see if this improved fatigue) He is followed by Cardiology - echo to be repeated in Oct Recommend follow up in about 2 month to see how he doing with higher Vitamin D and trying melatonin          Return in about 2 months (around 10/12/2023) for fatigue, HTN, HLD, COPD.   I, Kymberley Raz E Sandria Mcenroe, PA-C, have reviewed all documentation for this visit. The documentation on 08/13/23 for the exam, diagnosis, procedures, and orders are all accurate and complete.   Jacquelin Hawking, MHS, PA-C [facility]

## 2023-08-12 NOTE — Patient Instructions (Addendum)
I recommend trying 3-5 mg of Melatonin about an hour before bed to help you go to sleep at night This might help you with feeling so tired   Please continue to take the Vitamin D and B12 supplements Make sure you are eating regularly throughout the day and drinking plenty of water   We can see if you are feeling better in about 2 months

## 2023-08-13 NOTE — Assessment & Plan Note (Signed)
Chronic, historic condition Currently managed with Lisinopril 20 mg PO every day, hydrochlorothiazide 12.5 mg PO every day Appears to be tolerating well Reviewed home BP log- he is checking 3x/day  BP appears to have improved since adding hydrochlorothiazide - will need to continue monitoring CMP as he has continued concerns for fatigue Follow up in 3 months or sooner if concerns arise

## 2023-08-13 NOTE — Assessment & Plan Note (Signed)
Chronic, historic condition Will restart Atorvastatin as he said he has not had any for some time Continue aspirin as well Continue Cardiology collaboration  Follow up in 3 months or sooner if concerns arise

## 2023-09-11 ENCOUNTER — Other Ambulatory Visit: Payer: Self-pay | Admitting: Physician Assistant

## 2023-09-11 DIAGNOSIS — I10 Essential (primary) hypertension: Secondary | ICD-10-CM

## 2023-09-13 NOTE — Telephone Encounter (Signed)
Requested Prescriptions  Pending Prescriptions Disp Refills   hydrochlorothiazide (HYDRODIURIL) 12.5 MG tablet [Pharmacy Med Name: hydroCHLOROthiazide 12.5 MG Oral Tablet] 30 tablet 0    Sig: Take 1 tablet by mouth once daily     Cardiovascular: Diuretics - Thiazide Passed - 09/11/2023  6:53 AM      Passed - Cr in normal range and within 180 days    Creatinine, Ser  Date Value Ref Range Status  07/15/2023 1.09 0.76 - 1.27 mg/dL Final         Passed - K in normal range and within 180 days    Potassium  Date Value Ref Range Status  07/15/2023 4.4 3.5 - 5.2 mmol/L Final         Passed - Na in normal range and within 180 days    Sodium  Date Value Ref Range Status  07/15/2023 142 134 - 144 mmol/L Final         Passed - Last BP in normal range    BP Readings from Last 1 Encounters:  08/12/23 121/79         Passed - Valid encounter within last 6 months    Recent Outpatient Visits           1 month ago Essential hypertension   [place] [facility] Mecum, Oswaldo Conroy, PA-C   2 months ago Other fatigue   [place] [facility] Mecum, Oswaldo Conroy, PA-C   5 months ago Memory deficit   [place] [facility] Larae Grooms, NP   6 months ago Other fatigue   [place] [facility] Larae Grooms, NP   7 months ago Aortic atherosclerosis [facility])   [place] [facility] Larae Grooms, NP       Future Appointments             In 4 weeks Larae Grooms, NP  [facility]

## 2023-09-22 ENCOUNTER — Ambulatory Visit: Payer: Medicare Other | Attending: Cardiology

## 2023-09-22 DIAGNOSIS — I340 Nonrheumatic mitral (valve) insufficiency: Secondary | ICD-10-CM | POA: Insufficient documentation

## 2023-09-22 DIAGNOSIS — I2510 Atherosclerotic heart disease of native coronary artery without angina pectoris: Secondary | ICD-10-CM | POA: Insufficient documentation

## 2023-09-22 LAB — ECHOCARDIOGRAM COMPLETE
AR max vel: 2.08 cm2
AV Area VTI: 2.06 cm2
AV Area mean vel: 2.01 cm2
AV Mean grad: 4.6 mm[Hg]
AV Peak grad: 9 mm[Hg]
Ao pk vel: 1.5 m/s
Area-P 1/2: 4.89 cm2
Calc EF: 45.2 %
S' Lateral: 3.1 cm
Single Plane A2C EF: 43 %
Single Plane A4C EF: 47.4 %

## 2023-10-12 ENCOUNTER — Ambulatory Visit (INDEPENDENT_AMBULATORY_CARE_PROVIDER_SITE_OTHER): Payer: Medicare Other | Admitting: Nurse Practitioner

## 2023-10-12 ENCOUNTER — Encounter: Payer: Self-pay | Admitting: Nurse Practitioner

## 2023-10-12 VITALS — BP 114/71 | HR 81 | Temp 97.9°F | Ht 64.0 in | Wt 136.0 lb

## 2023-10-12 DIAGNOSIS — J449 Chronic obstructive pulmonary disease, unspecified: Secondary | ICD-10-CM

## 2023-10-12 DIAGNOSIS — E559 Vitamin D deficiency, unspecified: Secondary | ICD-10-CM

## 2023-10-12 DIAGNOSIS — I700 Atherosclerosis of aorta: Secondary | ICD-10-CM | POA: Diagnosis not present

## 2023-10-12 DIAGNOSIS — R5383 Other fatigue: Secondary | ICD-10-CM

## 2023-10-12 DIAGNOSIS — I10 Essential (primary) hypertension: Secondary | ICD-10-CM | POA: Diagnosis not present

## 2023-10-12 DIAGNOSIS — R7301 Impaired fasting glucose: Secondary | ICD-10-CM

## 2023-10-12 DIAGNOSIS — I2510 Atherosclerotic heart disease of native coronary artery without angina pectoris: Secondary | ICD-10-CM

## 2023-10-12 NOTE — Assessment & Plan Note (Signed)
Labs ordered at visit today.  Will make recommendations based on lab results.   

## 2023-10-12 NOTE — Assessment & Plan Note (Signed)
Chronic, historic condition Currently managed with Lisinopril 20 mg PO every day, hydrochlorothiazide 12.5 mg PO every day Appears to be tolerating well Reviewed home BP log- he is checking 3x/day  BP appears to have improved since adding hydrochlorothiazide - will need to continue monitoring CMP as he has continued concerns for fatigue Follow up in 3 months or sooner if concerns arise

## 2023-10-12 NOTE — Assessment & Plan Note (Signed)
Chronic.  Not well controlled.  Saw neurology. Has been taking Melatonin and Donepezil.  Feels like he is tolerating them well.  Has not heard about a sleep study that Neurology ordered but is open to completing it. Working with home health PT and OT.

## 2023-10-12 NOTE — Assessment & Plan Note (Signed)
Chronic.  Continue with ASA and Atorvastatin. Atorvastatin refilled by Cardiology.  Labs ordered today.  Return to clinic in 3 months for reevaluation.  Call sooner if concerns arise.

## 2023-10-12 NOTE — Assessment & Plan Note (Signed)
Chronic, Controlled. Will restart Atorvastatin as he said he has not had any for some time Continue aspirin as well Continue Cardiology collaboration  Follow up in 3 months or sooner if concerns arise

## 2023-10-12 NOTE — Progress Notes (Signed)
BP 114/71 (BP Location: Left Arm, Patient Position: Sitting, Cuff Size: Normal)   Pulse 81   Temp 97.9 F (36.6 C) (Oral)   Ht 5\' 4"  (1.626 m)   Wt 136 lb (61.7 kg)   SpO2 99%   BMI 23.34 kg/m    Subjective:    Patient ID: Ralph Leon, male    DOB: 1936-09-04, 87 y.o.   MRN: [id number]  HPI: Ralph Leon is a 87 y.o. male  Chief Complaint  Patient presents with   2 months follow up   Fatigue    Has become weak to the point standing has become difficult    Hypertension   Hyperlipidemia   COPD    HYPERTENSION Hypertension status: controlled  Satisfied with current treatment? no Duration of hypertension: years BP monitoring frequency:  daily BP range: 120/70 BP medication side effects:  no Medication compliance: excellent compliance Previous BP meds:none Aspirin: yes Recurrent headaches: no Visual changes: no Palpitations: no Dyspnea: no Chest pain: no Lower extremity edema: no Dizzy/lightheaded: no  COPD COPD status: controlled Satisfied with current treatment?: yes Oxygen use: no Dyspnea frequency:  Cough frequency:  Rescue inhaler frequency:   Limitation of activity: no Productive cough:  Last Spirometry:  Pneumovax: Not up to Date Influenza: Not up to Date  FATIGUE He reports he is still not feeling good Reports fatigue started about a year ago  States he has still been feeling tired  He reports fatigue seems to be getting worse  He states he is still having trouble falling asleep and in the AM he has to "fight to get out of bed" like he has trouble moving his extremities  He denies stiffness but once he gets his feet on the floor he is able to get going  He thinks he is sleeping okay but is not sure - states he dreams a lot but this doesn't bother him  He started Melatonin but doesn't think it is helping.     Patient states he started the Donepezil.  He feels like he is tolerating it okay but feels like it makes him sleep a little longer  than normal.  He states he isn't having bad dreams but they are "stupid and don't really make sense".  Home health is coming out to his home to help him with PT and OT.   DEPRESSION Patient states he doesn't feel down or depressed.  States he has never been on medication.  He doesn't feel like he needs to be on medication.  He feels like his depression screen is up because he is tired all of the time.  Denies SI.  Flowsheet Row Office Visit from 08/12/2023 in [facility]  PHQ-9 Total Score 11         08/12/2023    2:56 PM 07/15/2023    2:33 PM 04/14/2023    2:29 PM 03/03/2023    2:42 PM  GAD 7 : Generalized Anxiety Score  Nervous, Anxious, on Edge 0 1 0 0  Control/stop worrying 0 0 0 0  Worry too much - different things 0 0 0 0  Trouble relaxing 1 1 1 1   Restless 0 1 0 0  Easily annoyed or irritable 0 0 0 0  Afraid - awful might happen 0 0 0 0  Total GAD 7 Score 1 3 1 1   Anxiety Difficulty Somewhat difficult Not difficult at all        Relevant past medical, surgical, family and social  history reviewed and updated as indicated. Interim medical history since our last visit reviewed. Allergies and medications reviewed and updated.  Review of Systems  Constitutional:  Positive for fatigue.  Eyes:  Negative for visual disturbance.  Respiratory:  Negative for shortness of breath.   Cardiovascular:  Negative for chest pain and leg swelling.  Neurological:  Negative for light-headedness and headaches.  Psychiatric/Behavioral:  Positive for dysphoric mood and sleep disturbance.     Per HPI unless specifically indicated above     Objective:    BP 114/71 (BP Location: Left Arm, Patient Position: Sitting, Cuff Size: Normal)   Pulse 81   Temp 97.9 F (36.6 C) (Oral)   Ht 5\' 4"  (1.626 m)   Wt 136 lb (61.7 kg)   SpO2 99%   BMI 23.34 kg/m   Wt Readings from Last 3 Encounters:  10/12/23 136 lb (61.7 kg)  08/12/23 137 lb 12.8 oz (62.5 kg)  07/15/23 138 lb  (62.6 kg)    Physical Exam Vitals and nursing note reviewed.  Constitutional:      General: He is not in acute distress.    Appearance: Normal appearance. He is not ill-appearing, toxic-appearing or diaphoretic.  HENT:     Head: Normocephalic.     Right Ear: External ear normal.     Left Ear: External ear normal.     Nose: Nose normal. No congestion or rhinorrhea.     Mouth/Throat:     Mouth: Mucous membranes are moist.  Eyes:     General:        Right eye: No discharge.        Left eye: No discharge.     Extraocular Movements: Extraocular movements intact.     Conjunctiva/sclera: Conjunctivae normal.     Pupils: Pupils are equal, round, and reactive to light.  Cardiovascular:     Rate and Rhythm: Normal rate and regular rhythm.     Heart sounds: No murmur heard. Pulmonary:     Effort: Pulmonary effort is normal. No respiratory distress.     Breath sounds: Normal breath sounds. No wheezing, rhonchi or rales.  Abdominal:     General: Abdomen is flat. Bowel sounds are normal.  Musculoskeletal:     Cervical back: Normal range of motion and neck supple.     Comments: Uses walker.   Skin:    General: Skin is warm and dry.     Capillary Refill: Capillary refill takes less than 2 seconds.  Neurological:     General: No focal deficit present.     Mental Status: He is alert and oriented to person, place, and time.  Psychiatric:        Mood and Affect: Mood normal.        Behavior: Behavior normal.        Thought Content: Thought content normal.        Judgment: Judgment normal.     Results for orders placed or performed in visit on 09/22/23  ECHOCARDIOGRAM COMPLETE  Result Value Ref Range   AR max vel 2.08 cm2   AV Peak grad 9.0 mmHg   Ao pk vel 1.50 m/s   S' Lateral 3.10 cm   Area-P 1/2 4.89 cm2   AV Area VTI 2.06 cm2   AV Mean grad 4.6 mmHg   Single Plane A4C EF 47.4 %   Single Plane A2C EF 43.0 %   Calc EF 45.2 %   AV Area mean vel 2.01 cm2   Est  EF 55 - 60%        Assessment & Plan:   Problem List Items Addressed This Visit       Cardiovascular and Mediastinum   Essential hypertension    Chronic, historic condition Currently managed with Lisinopril 20 mg PO every day, hydrochlorothiazide 12.5 mg PO every day Appears to be tolerating well Reviewed home BP log- he is checking 3x/day  BP appears to have improved since adding hydrochlorothiazide - will need to continue monitoring CMP as he has continued concerns for fatigue Follow up in 3 months or sooner if concerns arise       Relevant Orders   Comp Met (CMET)   Coronary artery disease    Chronic, Controlled. Will restart Atorvastatin as he said he has not had any for some time Continue aspirin as well Continue Cardiology collaboration  Follow up in 3 months or sooner if concerns arise       Relevant Orders   Lipid Profile   Aortic atherosclerosis (HCC) - Primary    Chronic.  Continue with ASA and Atorvastatin. Atorvastatin refilled by Cardiology.  Labs ordered today.  Return to clinic in 3 months for reevaluation.  Call sooner if concerns arise.          Respiratory   COPD (chronic obstructive pulmonary disease) (HCC)    Chronic, Controlled. Appears well-controlled at this time.  He does not appear to be taking any medications or inhalers. He states that he only occasionally has shortness of breath on exertion when he is walking for prolonged periods.  He denies frequent coughing, productive cough, oxygen use. Continue current regimen Follow-up as needed for persistent or progressing symptoms        Endocrine   IFG (impaired fasting glucose)    Labs ordered at visit today.  Will make recommendations based on lab results.        Relevant Orders   HgB A1c     Other   Other fatigue    Chronic.  Not well controlled.  Saw neurology. Has been taking Melatonin and Donepezil.  Feels like he is tolerating them well.  Has not heard about a sleep study that Neurology ordered  but is open to completing it. Working with home health PT and OT.       Vitamin D deficiency    Labs ordered at visit today.  Will make recommendations based on lab results.        Relevant Orders   Vitamin D (25 hydroxy)      Follow up plan: Return in about 3 months (around 01/12/2024) for HTN, HLD, DM2 FU.

## 2023-10-12 NOTE — Assessment & Plan Note (Signed)
Chronic, Controlled. Appears well-controlled at this time.  He does not appear to be taking any medications or inhalers. He states that he only occasionally has shortness of breath on exertion when he is walking for prolonged periods.  He denies frequent coughing, productive cough, oxygen use. Continue current regimen Follow-up as needed for persistent or progressing symptoms

## 2023-10-13 ENCOUNTER — Other Ambulatory Visit: Payer: Self-pay | Admitting: Nurse Practitioner

## 2023-10-13 DIAGNOSIS — I10 Essential (primary) hypertension: Secondary | ICD-10-CM

## 2023-10-13 LAB — COMPREHENSIVE METABOLIC PANEL
ALT: 8 [IU]/L (ref 0–44)
AST: 13 [IU]/L (ref 0–40)
Albumin: 4.4 g/dL (ref 3.7–4.7)
Alkaline Phosphatase: 81 [IU]/L (ref 44–121)
BUN/Creatinine Ratio: 22 (ref 10–24)
BUN: 26 mg/dL (ref 8–27)
Bilirubin Total: 0.7 mg/dL (ref 0.0–1.2)
CO2: 24 mmol/L (ref 20–29)
Calcium: 9.6 mg/dL (ref 8.6–10.2)
Chloride: 103 mmol/L (ref 96–106)
Creatinine, Ser: 1.18 mg/dL (ref 0.76–1.27)
Globulin, Total: 2.3 g/dL (ref 1.5–4.5)
Glucose: 77 mg/dL (ref 70–99)
Potassium: 4.5 mmol/L (ref 3.5–5.2)
Sodium: 143 mmol/L (ref 134–144)
Total Protein: 6.7 g/dL (ref 6.0–8.5)
eGFR: 60 mL/min/{1.73_m2} (ref >59)

## 2023-10-13 LAB — VITAMIN D 25 HYDROXY (VIT D DEFICIENCY, FRACTURES): Vit D, 25-Hydroxy: 96 ng/mL (ref 30.0–100.0)

## 2023-10-13 LAB — LIPID PANEL
Chol/HDL Ratio: 2.2 ratio (ref 0.0–5.0)
Cholesterol, Total: 159 mg/dL (ref 100–199)
HDL: 71 mg/dL (ref >39)
LDL Chol Calc (NIH): 73 mg/dL (ref 0–99)
Triglycerides: 80 mg/dL (ref 0–149)
VLDL Cholesterol Cal: 15 mg/dL (ref 5–40)

## 2023-10-13 LAB — HEMOGLOBIN A1C
Est. average glucose Bld gHb Est-mCnc: 137 mg/dL
Hgb A1c MFr Bld: 6.4 % — ABNORMAL HIGH (ref 4.8–5.6)

## 2023-10-14 NOTE — Telephone Encounter (Signed)
Requested Prescriptions  Pending Prescriptions Disp Refills   hydrochlorothiazide (HYDRODIURIL) 12.5 MG tablet [Pharmacy Med Name: hydroCHLOROthiazide 12.5 MG Oral Tablet] 30 tablet 2    Sig: Take 1 tablet by mouth once daily     Cardiovascular: Diuretics - Thiazide Passed - 10/13/2023  6:52 AM      Passed - Cr in normal range and within 180 days    Creatinine, Ser  Date Value Ref Range Status  10/12/2023 1.18 0.76 - 1.27 mg/dL Final         Passed - K in normal range and within 180 days    Potassium  Date Value Ref Range Status  10/12/2023 4.5 3.5 - 5.2 mmol/L Final         Passed - Na in normal range and within 180 days    Sodium  Date Value Ref Range Status  10/12/2023 143 134 - 144 mmol/L Final         Passed - Last BP in normal range    BP Readings from Last 1 Encounters:  10/12/23 114/71         Passed - Valid encounter within last 6 months    Recent Outpatient Visits           2 days ago Aortic atherosclerosis (HCC)   [place] [facility] Larae Grooms, NP   2 months ago Essential hypertension   [place] [address] [facility] Mecum, Oswaldo Conroy, PA-C   3 months ago Other fatigue   [place] [address] [facility] Mecum, Oswaldo Conroy, PA-C   6 months ago Memory deficit   [place] [facility] Larae Grooms, NP   7 months ago Other fatigue   [place] [facility] Larae Grooms, NP       Future Appointments             In 3 months Larae Grooms, NP [place] [facility]

## 2023-10-20 ENCOUNTER — Other Ambulatory Visit: Payer: Self-pay | Admitting: Nurse Practitioner

## 2023-10-20 NOTE — Telephone Encounter (Signed)
Requested Prescriptions  Pending Prescriptions Disp Refills   lisinopril (ZESTRIL) 20 MG tablet [Pharmacy Med Name: Lisinopril 20 MG Oral Tablet] 90 tablet 1    Sig: Take 1 tablet by mouth once daily     Cardiovascular:  ACE Inhibitors Passed - 10/20/2023  4:29 PM      Passed - Cr in normal range and within 180 days    Creatinine, Ser  Date Value Ref Range Status  10/12/2023 1.18 0.76 - 1.27 mg/dL Final         Passed - K in normal range and within 180 days    Potassium  Date Value Ref Range Status  10/12/2023 4.5 3.5 - 5.2 mmol/L Final         Passed - Patient is not pregnant      Passed - Last BP in normal range    BP Readings from Last 1 Encounters:  10/12/23 114/71         Passed - Valid encounter within last 6 months    Recent Outpatient Visits           1 week ago Aortic atherosclerosis (HCC)   [place] [facility] Larae Grooms, NP   2 months ago Essential hypertension   [place] [address] [facility] Mecum, Oswaldo Conroy, PA-C   3 months ago Other fatigue   [place] [address] [facility] Mecum, Oswaldo Conroy, PA-C   6 months ago Memory deficit   [place] [facility] Larae Grooms, NP   7 months ago Other fatigue   [place] [facility] Larae Grooms, NP       Future Appointments             In 2 months Larae Grooms, NP [facility], [facility]

## 2024-01-11 ENCOUNTER — Telehealth: Payer: Self-pay

## 2024-01-11 NOTE — Telephone Encounter (Signed)
 Patient aware of appointment change from 01/12/2024 to 01/17/2024 due to inclement weather.

## 2024-01-12 ENCOUNTER — Ambulatory Visit: Payer: Self-pay | Admitting: Nurse Practitioner

## 2024-01-16 ENCOUNTER — Other Ambulatory Visit: Payer: Self-pay | Admitting: Nurse Practitioner

## 2024-01-16 DIAGNOSIS — I10 Essential (primary) hypertension: Secondary | ICD-10-CM

## 2024-01-17 ENCOUNTER — Ambulatory Visit (INDEPENDENT_AMBULATORY_CARE_PROVIDER_SITE_OTHER): Payer: Medicare Other | Admitting: Nurse Practitioner

## 2024-01-17 ENCOUNTER — Encounter: Payer: Self-pay | Admitting: Nurse Practitioner

## 2024-01-17 VITALS — BP 110/68 | HR 86 | Ht 64.0 in | Wt 135.0 lb

## 2024-01-17 DIAGNOSIS — J449 Chronic obstructive pulmonary disease, unspecified: Secondary | ICD-10-CM | POA: Diagnosis not present

## 2024-01-17 DIAGNOSIS — Z8673 Personal history of transient ischemic attack (TIA), and cerebral infarction without residual deficits: Secondary | ICD-10-CM | POA: Diagnosis not present

## 2024-01-17 DIAGNOSIS — E559 Vitamin D deficiency, unspecified: Secondary | ICD-10-CM

## 2024-01-17 DIAGNOSIS — R5383 Other fatigue: Secondary | ICD-10-CM

## 2024-01-17 DIAGNOSIS — I10 Essential (primary) hypertension: Secondary | ICD-10-CM | POA: Diagnosis not present

## 2024-01-17 DIAGNOSIS — H9190 Unspecified hearing loss, unspecified ear: Secondary | ICD-10-CM

## 2024-01-17 NOTE — Assessment & Plan Note (Signed)
 Chronic, controlled. Currently managed with Lisinopril 20 mg PO every day, hydrochlorothiazide 12.5 mg PO every day Appears to be tolerating well BP appears to have improved since adding hydrochlorothiazide - will need to continue monitoring CMP as he has continued concerns for fatigue Follow up in 3 months or sooner if concerns arise

## 2024-01-17 NOTE — Assessment & Plan Note (Signed)
 Chronic, Controlled. Appears well-controlled at this time.  He states that he only occasionally has shortness of breath on exertion when he is walking for prolonged periods.  He denies frequent coughing, productive cough, oxygen use. Continue current regimen Follow-up as needed for persistent or progressing symptoms

## 2024-01-17 NOTE — Assessment & Plan Note (Addendum)
 Chronic.  Ongoing concern.  B1 and vitamin D checked at visit today.  Did not get sleep study done that was ordered by Neurology.  Has follow up next month.  Suspect fatigue is related to depression/social isolation.  Encouraged regular sleep habits with proper sleep hygiene.

## 2024-01-17 NOTE — Progress Notes (Signed)
 BP 110/68 (BP Location: Right Arm, Patient Position: Sitting, Cuff Size: Normal)   Pulse 86   Ht 5\' 4"  (1.626 m)   Wt 135 lb (61.2 kg)   SpO2 96%   BMI 23.17 kg/m    Subjective:    Patient ID: Ralph Leon, male    DOB: 1936/07/19, 88 y.o.   MRN: [id number]  HPI: Ralph Leon is a 88 y.o. male  Chief Complaint  Patient presents with   Fatigue         HYPERTENSION Hypertension status: controlled  Satisfied with current treatment? no Duration of hypertension: years BP monitoring frequency:  daily BP range: 120/70 BP medication side effects:  no Medication compliance: excellent compliance Previous BP meds:none Aspirin: yes Recurrent headaches: no Visual changes: no Palpitations: no Dyspnea: no Chest pain: no Lower extremity edema: no Dizzy/lightheaded: no  COPD COPD status: controlled Satisfied with current treatment?: yes Oxygen use: no Dyspnea frequency:  Cough frequency:  Rescue inhaler frequency:   Limitation of activity: no Productive cough:  Last Spirometry:  Pneumovax: Not up to Date Influenza: Not up to Date  FATIGUE Patient has an appt with Neurology next month.  He did not do the sleep study.  He feels like it is still hard to get out of bed sometimes.  Feels like he sleeps longer.  He does go to bed late a lot of times.  He did try the melatonin but didn't feel much relief with it.     He completed Home Health PT and OT- he still has a little bit of problem walking at times.  He denies any falls.  Sometimes he feels more weak.  "Gets a little more worse everyday".   He reports he didn't hear from the audiologist about an appointment.    He did not receive information on a sleep study after Neurology appt either.    Relevant past medical, surgical, family and social history reviewed and updated as indicated. Interim medical history since our last visit reviewed. Allergies and medications reviewed and updated.  Review of Systems   Constitutional:  Positive for fatigue.  Eyes:  Negative for visual disturbance.  Respiratory:  Negative for shortness of breath.   Cardiovascular:  Negative for chest pain and leg swelling.  Neurological:  Negative for light-headedness and headaches.  Psychiatric/Behavioral:  Positive for dysphoric mood and sleep disturbance.     Per HPI unless specifically indicated above     Objective:    BP 110/68 (BP Location: Right Arm, Patient Position: Sitting, Cuff Size: Normal)   Pulse 86   Ht 5\' 4"  (1.626 m)   Wt 135 lb (61.2 kg)   SpO2 96%   BMI 23.17 kg/m   Wt Readings from Last 3 Encounters:  01/17/24 135 lb (61.2 kg)  10/12/23 136 lb (61.7 kg)  08/12/23 137 lb 12.8 oz (62.5 kg)    Physical Exam Vitals and nursing note reviewed.  Constitutional:      General: He is not in acute distress.    Appearance: Normal appearance. He is not ill-appearing, toxic-appearing or diaphoretic.  HENT:     Head: Normocephalic.     Right Ear: External ear normal.     Left Ear: External ear normal.     Nose: Nose normal. No congestion or rhinorrhea.     Mouth/Throat:     Mouth: Mucous membranes are moist.  Eyes:     General:        Right eye: No discharge.  Left eye: No discharge.     Extraocular Movements: Extraocular movements intact.     Conjunctiva/sclera: Conjunctivae normal.     Pupils: Pupils are equal, round, and reactive to light.  Cardiovascular:     Rate and Rhythm: Normal rate and regular rhythm.     Heart sounds: No murmur heard. Pulmonary:     Effort: Pulmonary effort is normal. No respiratory distress.     Breath sounds: Normal breath sounds. No wheezing, rhonchi or rales.  Abdominal:     General: Abdomen is flat. Bowel sounds are normal.  Musculoskeletal:     Cervical back: Normal range of motion and neck supple.     Comments: Uses walker.   Skin:    General: Skin is warm and dry.     Capillary Refill: Capillary refill takes less than 2 seconds.  Neurological:      General: No focal deficit present.     Mental Status: He is alert and oriented to person, place, and time.  Psychiatric:        Mood and Affect: Mood normal.        Behavior: Behavior normal.        Thought Content: Thought content normal.        Judgment: Judgment normal.     Results for orders placed or performed in visit on 10/12/23  Comp Met (CMET)   Collection Time: 10/12/23  2:50 PM  Result Value Ref Range   Glucose 77 70 - 99 mg/dL   BUN 26 8 - 27 mg/dL   Creatinine, Ser 1.61 0.76 - 1.27 mg/dL   eGFR 60 >09 UE/AVW/0.98   BUN/Creatinine Ratio 22 10 - 24   Sodium 143 134 - 144 mmol/L   Potassium 4.5 3.5 - 5.2 mmol/L   Chloride 103 96 - 106 mmol/L   CO2 24 20 - 29 mmol/L   Calcium 9.6 8.6 - 10.2 mg/dL   Total Protein 6.7 6.0 - 8.5 g/dL   Albumin 4.4 3.7 - 4.7 g/dL   Globulin, Total 2.3 1.5 - 4.5 g/dL   Bilirubin Total 0.7 0.0 - 1.2 mg/dL   Alkaline Phosphatase 81 44 - 121 IU/L   AST 13 0 - 40 IU/L   ALT 8 0 - 44 IU/L  Lipid Profile   Collection Time: 10/12/23  2:50 PM  Result Value Ref Range   Cholesterol, Total 159 100 - 199 mg/dL   Triglycerides 80 0 - 149 mg/dL   HDL 71 >11 mg/dL   VLDL Cholesterol Cal 15 5 - 40 mg/dL   LDL Chol Calc (NIH) 73 0 - 99 mg/dL   Chol/HDL Ratio 2.2 0.0 - 5.0 ratio  Vitamin D (25 hydroxy)   Collection Time: 10/12/23  2:50 PM  Result Value Ref Range   Vit D, 25-Hydroxy 96.0 30.0 - 100.0 ng/mL  HgB A1c   Collection Time: 10/12/23  2:50 PM  Result Value Ref Range   Hgb A1c MFr Bld 6.4 (H) 4.8 - 5.6 %   Est. average glucose Bld gHb Est-mCnc 137 mg/dL      Assessment & Plan:   Problem List Items Addressed This Visit       Cardiovascular and Mediastinum   Essential hypertension - Primary   Chronic, controlled. Currently managed with Lisinopril 20 mg PO every day, hydrochlorothiazide 12.5 mg PO every day Appears to be tolerating well BP appears to have improved since adding hydrochlorothiazide - will need to continue  monitoring CMP as he has continued concerns for  fatigue Follow up in 3 months or sooner if concerns arise         Respiratory   COPD (chronic obstructive pulmonary disease) (HCC)   Chronic, Controlled. Appears well-controlled at this time.  He states that he only occasionally has shortness of breath on exertion when he is walking for prolonged periods.  He denies frequent coughing, productive cough, oxygen use. Continue current regimen Follow-up as needed for persistent or progressing symptoms        Other   History of CVA (cerebrovascular accident)   Other fatigue   Chronic.  Ongoing concern.  B1 and vitamin D checked at visit today.  Did not get sleep study done that was ordered by Neurology.  Has follow up next month.  Suspect fatigue is related to depression/social isolation.  Encouraged regular sleep habits with proper sleep hygiene.        Relevant Orders   Comp Met (CMET)   Vitamin D (25 hydroxy)   Vitamin B1   Vitamin D deficiency   Labs ordered at visit today.  Will make recommendations based on lab results.        Relevant Orders   Vitamin D (25 hydroxy)   Other Visit Diagnoses       Hard of hearing       Relevant Orders   Ambulatory referral to Audiology         Follow up plan: Return in about 3 months (around 04/15/2024) for HTN, HLD, DM2 FU.

## 2024-01-17 NOTE — Assessment & Plan Note (Signed)
 Labs ordered at visit today.  Will make recommendations based on lab results.

## 2024-01-18 NOTE — Telephone Encounter (Signed)
 Requested by interface surescripts. Future visit in 3 months.  Requested Prescriptions  Pending Prescriptions Disp Refills   hydrochlorothiazide (HYDRODIURIL) 12.5 MG tablet [Pharmacy Med Name: HYDROCHLOROTHIAZIDE 12.5MG  TAB] 90 tablet 0    Sig: Take 1 tablet by mouth once daily     Cardiovascular: Diuretics - Thiazide Failed - 01/18/2024  9:16 AM      Failed - Cr in normal range and within 180 days    Creatinine, Ser  Date Value Ref Range Status  01/17/2024 1.28 (H) 0.76 - 1.27 mg/dL Final         Passed - K in normal range and within 180 days    Potassium  Date Value Ref Range Status  01/17/2024 4.4 3.5 - 5.2 mmol/L Final         Passed - Na in normal range and within 180 days    Sodium  Date Value Ref Range Status  01/17/2024 142 134 - 144 mmol/L Final         Passed - Last BP in normal range    BP Readings from Last 1 Encounters:  01/17/24 110/68         Passed - Valid encounter within last 6 months    Recent Outpatient Visits           3 months ago Aortic atherosclerosis (HCC)   [place] [facility] Larae Grooms, NP   5 months ago Essential hypertension   [place] [address] [facility] Mecum, Oswaldo Conroy, PA-C   6 months ago Other fatigue   [place] [address] [facility] Mecum, Oswaldo Conroy, PA-C   9 months ago Memory deficit   [place] [facility] Larae Grooms, NP   10 months ago Other fatigue   [place] [facility] Larae Grooms, NP       Future Appointments             In 3 months Larae Grooms, NP [facility]

## 2024-01-18 NOTE — Addendum Note (Signed)
 Addended by: Larae Grooms on: 01/18/2024 08:38 AM   Modules accepted: Orders

## 2024-01-20 LAB — COMPREHENSIVE METABOLIC PANEL
ALT: 9 IU/L (ref 0–44)
AST: 12 IU/L (ref 0–40)
Albumin: 4.3 g/dL (ref 3.7–4.7)
Alkaline Phosphatase: 84 IU/L (ref 44–121)
BUN/Creatinine Ratio: 20 (ref 10–24)
BUN: 25 mg/dL (ref 8–27)
Bilirubin Total: 0.6 mg/dL (ref 0.0–1.2)
CO2: 24 mmol/L (ref 20–29)
Calcium: 9.8 mg/dL (ref 8.6–10.2)
Chloride: 103 mmol/L (ref 96–106)
Creatinine, Ser: 1.28 mg/dL — ABNORMAL HIGH (ref 0.76–1.27)
Globulin, Total: 2 g/dL (ref 1.5–4.5)
Glucose: 87 mg/dL (ref 70–99)
Potassium: 4.4 mmol/L (ref 3.5–5.2)
Sodium: 142 mmol/L (ref 134–144)
Total Protein: 6.3 g/dL (ref 6.0–8.5)
eGFR: 55 mL/min/{1.73_m2} — ABNORMAL LOW (ref >59)

## 2024-01-20 LAB — VITAMIN B1: Thiamine: 111.4 nmol/L (ref 66.5–200.0)

## 2024-01-20 LAB — VITAMIN D 25 HYDROXY (VIT D DEFICIENCY, FRACTURES): Vit D, 25-Hydroxy: 48.6 ng/mL (ref 30.0–100.0)

## 2024-01-26 ENCOUNTER — Other Ambulatory Visit: Payer: Medicare Other

## 2024-01-27 ENCOUNTER — Ambulatory Visit: Payer: Medicare Other | Admitting: Nurse Practitioner

## 2024-01-27 ENCOUNTER — Other Ambulatory Visit

## 2024-01-27 DIAGNOSIS — I10 Essential (primary) hypertension: Secondary | ICD-10-CM

## 2024-01-28 LAB — COMPREHENSIVE METABOLIC PANEL
ALT: 9 IU/L (ref 0–44)
AST: 14 IU/L (ref 0–40)
Albumin: 4.5 g/dL (ref 3.7–4.7)
Alkaline Phosphatase: 88 IU/L (ref 44–121)
BUN/Creatinine Ratio: 19 (ref 10–24)
BUN: 22 mg/dL (ref 8–27)
Bilirubin Total: 0.6 mg/dL (ref 0.0–1.2)
CO2: 24 mmol/L (ref 20–29)
Calcium: 9.8 mg/dL (ref 8.6–10.2)
Chloride: 101 mmol/L (ref 96–106)
Creatinine, Ser: 1.15 mg/dL (ref 0.76–1.27)
Globulin, Total: 2.3 g/dL (ref 1.5–4.5)
Glucose: 86 mg/dL (ref 70–99)
Potassium: 4.4 mmol/L (ref 3.5–5.2)
Sodium: 141 mmol/L (ref 134–144)
Total Protein: 6.8 g/dL (ref 6.0–8.5)
eGFR: 62 mL/min/{1.73_m2} (ref >59)

## 2024-02-08 ENCOUNTER — Other Ambulatory Visit: Payer: Self-pay | Admitting: Physician Assistant

## 2024-02-09 NOTE — Telephone Encounter (Signed)
 Requested Prescriptions  Pending Prescriptions Disp Refills   atorvastatin (LIPITOR) 40 MG tablet [Pharmacy Med Name: Atorvastatin Calcium 40 MG Oral Tablet] 90 tablet 0    Sig: Take 1 tablet by mouth once daily     Cardiovascular:  Antilipid - Statins Failed - 02/09/2024 11:45 AM      Failed - Lipid Panel in normal range within the last 12 months    Cholesterol, Total  Date Value Ref Range Status  10/12/2023 159 100 - 199 mg/dL Final   LDL Chol Calc (NIH)  Date Value Ref Range Status  10/12/2023 73 0 - 99 mg/dL Final   HDL  Date Value Ref Range Status  10/12/2023 71 >39 mg/dL Final   Triglycerides  Date Value Ref Range Status  10/12/2023 80 0 - 149 mg/dL Final         Passed - Patient is not pregnant      Passed - Valid encounter within last 12 months    Recent Outpatient Visits           4 months ago Aortic atherosclerosis (HCC)   [place] [facility] Larae Grooms, NP   6 months ago Essential hypertension   [place] [address] [facility] Mecum, Oswaldo Conroy, PA-C   6 months ago Other fatigue   [place] [address] [facility] Mecum, Oswaldo Conroy, PA-C   10 months ago Memory deficit   [place] [facility] Larae Grooms, NP   11 months ago Other fatigue   [place] [facility] Larae Grooms, NP       Future Appointments             In 2 months Larae Grooms, NP [facility]

## 2024-04-16 ENCOUNTER — Other Ambulatory Visit: Payer: Self-pay | Admitting: Nurse Practitioner

## 2024-04-16 DIAGNOSIS — I10 Essential (primary) hypertension: Secondary | ICD-10-CM

## 2024-04-19 ENCOUNTER — Ambulatory Visit: Payer: Medicare Other | Admitting: Nurse Practitioner

## 2024-04-19 ENCOUNTER — Encounter: Payer: Self-pay | Admitting: Nurse Practitioner

## 2024-04-19 VITALS — BP 112/72 | HR 80 | Temp 98.4°F | Resp 16 | Ht 64.02 in | Wt 132.2 lb

## 2024-04-19 DIAGNOSIS — E7800 Pure hypercholesterolemia, unspecified: Secondary | ICD-10-CM

## 2024-04-19 DIAGNOSIS — Z0000 Encounter for general adult medical examination without abnormal findings: Secondary | ICD-10-CM

## 2024-04-19 DIAGNOSIS — I2510 Atherosclerotic heart disease of native coronary artery without angina pectoris: Secondary | ICD-10-CM | POA: Diagnosis not present

## 2024-04-19 DIAGNOSIS — E559 Vitamin D deficiency, unspecified: Secondary | ICD-10-CM

## 2024-04-19 DIAGNOSIS — I700 Atherosclerosis of aorta: Secondary | ICD-10-CM | POA: Diagnosis not present

## 2024-04-19 DIAGNOSIS — I10 Essential (primary) hypertension: Secondary | ICD-10-CM

## 2024-04-19 DIAGNOSIS — J449 Chronic obstructive pulmonary disease, unspecified: Secondary | ICD-10-CM

## 2024-04-19 DIAGNOSIS — R5383 Other fatigue: Secondary | ICD-10-CM

## 2024-04-19 DIAGNOSIS — R7301 Impaired fasting glucose: Secondary | ICD-10-CM

## 2024-04-19 MED ORDER — METHYLPREDNISOLONE 4 MG PO TBPK: ORAL_TABLET | ORAL | 0 refills | Status: DC

## 2024-04-19 MED ORDER — ALBUTEROL SULFATE HFA 108 (90 BASE) MCG/ACT IN AERS: 2.0000 | INHALATION_SPRAY | Freq: Four times a day (QID) | RESPIRATORY_TRACT | 0 refills | Status: DC | PRN

## 2024-04-19 NOTE — Progress Notes (Unsigned)
 BP 112/72 (BP Location: Right Arm, Patient Position: Sitting, Cuff Size: Normal)   Pulse 80   Temp 98.4 F (36.9 C) (Oral)   Resp 16   Ht 5' 4.02" (1.626 m)   Wt 132 lb 3.2 oz (60 kg)   SpO2 96%   BMI 22.68 kg/m    Subjective:    Patient ID: Ralph Leon, male    DOB: [birth year], 88 y.o.   MRN: [id number]  HPI: Ralph Leon is a 88 y.o. male  Chief Complaint  Patient presents with   Medicare wellness visit    HYPERTENSION Hypertension status: controlled  Satisfied with current treatment? no Duration of hypertension: years BP monitoring frequency:  daily BP range: 120/70 BP medication side effects:  no Medication compliance: excellent compliance Previous BP meds:none Aspirin : yes Recurrent headaches: no Visual changes: no Palpitations: no Dyspnea: no Chest pain: no Lower extremity edema: no Dizzy/lightheaded: no  COPD Cough has been ongoing for 2-3 days now.  Worse in the morning.  He is coughing stuff up at times.  Denies fever.  Does have some SOB.  He doesn't use any inhalers.  COPD status: controlled Satisfied with current treatment?: yes Oxygen use: no Dyspnea frequency:  Cough frequency:  Rescue inhaler frequency:   Limitation of activity: no Productive cough:  Last Spirometry:  Pneumovax: Not up to Date Influenza: Not up to Date  FATIGUE Patient has an appt with Neurology next month.  He did not do the sleep study.  He feels like it is still hard to get out of bed sometimes.  Feels like he sleeps longer.  He does go to bed late a lot of times.  Its really hard to get up in the morning but symptoms do improve throughout the day.   He has not seen the Audiologist yet.  He missed their call and plans to call them back soon.     He recently had EMG nerve testing which was unremarkable.  Did not do the sleep study ordered by Neurology.     Relevant past medical, surgical, family and social history reviewed and updated as indicated. Interim medical  history since our last visit reviewed. Allergies and medications reviewed and updated.  Review of Systems  Constitutional:  Positive for fatigue.  Eyes:  Negative for visual disturbance.  Respiratory:  Positive for cough and shortness of breath.   Cardiovascular:  Negative for chest pain and leg swelling.  Neurological:  Negative for light-headedness and headaches.  Psychiatric/Behavioral:  Positive for dysphoric mood and sleep disturbance.     Per HPI unless specifically indicated above     Objective:    BP 112/72 (BP Location: Right Arm, Patient Position: Sitting, Cuff Size: Normal)   Pulse 80   Temp 98.4 F (36.9 C) (Oral)   Resp 16   Ht 5' 4.02" (1.626 m)   Wt 132 lb 3.2 oz (60 kg)   SpO2 96%   BMI 22.68 kg/m   Wt Readings from Last 3 Encounters:  04/19/24 132 lb 3.2 oz (60 kg)  01/17/24 135 lb (61.2 kg)  10/12/23 136 lb (61.7 kg)    Physical Exam Vitals and nursing note reviewed.  Constitutional:      General: He is not in acute distress.    Appearance: Normal appearance. He is not ill-appearing, toxic-appearing or diaphoretic.  HENT:     Head: Normocephalic.     Right Ear: External ear normal.     Left Ear: External ear normal.  Nose: Nose normal. No congestion or rhinorrhea.     Mouth/Throat:     Mouth: Mucous membranes are moist.  Eyes:     General:        Right eye: No discharge.        Left eye: No discharge.     Extraocular Movements: Extraocular movements intact.     Conjunctiva/sclera: Conjunctivae normal.     Pupils: Pupils are equal, round, and reactive to light.  Cardiovascular:     Rate and Rhythm: Normal rate and regular rhythm.     Heart sounds: No murmur heard. Pulmonary:     Effort: Pulmonary effort is normal. No respiratory distress.     Breath sounds: Decreased air movement present. Wheezing present. No rhonchi or rales.  Abdominal:     General: Abdomen is flat. Bowel sounds are normal.  Musculoskeletal:     Cervical back: Normal  range of motion and neck supple.     Comments: Uses walker.   Skin:    General: Skin is warm and dry.     Capillary Refill: Capillary refill takes less than 2 seconds.  Neurological:     General: No focal deficit present.     Mental Status: He is alert and oriented to person, place, and time.  Psychiatric:        Mood and Affect: Mood normal.        Behavior: Behavior normal.        Thought Content: Thought content normal.        Judgment: Judgment normal.     Results for orders placed or performed in visit on 01/27/24  Comp Met (CMET)   Collection Time: 01/27/24  3:40 PM  Result Value Ref Range   Glucose 86 70 - 99 mg/dL   BUN 22 8 - 27 mg/dL   Creatinine, Ser 1.61 0.76 - 1.27 mg/dL   eGFR 62 >09 UE/AVW/0.98   BUN/Creatinine Ratio 19 10 - 24   Sodium 141 134 - 144 mmol/L   Potassium 4.4 3.5 - 5.2 mmol/L   Chloride 101 96 - 106 mmol/L   CO2 24 20 - 29 mmol/L   Calcium  9.8 8.6 - 10.2 mg/dL   Total Protein 6.8 6.0 - 8.5 g/dL   Albumin 4.5 3.7 - 4.7 g/dL   Globulin, Total 2.3 1.5 - 4.5 g/dL   Bilirubin Total 0.6 0.0 - 1.2 mg/dL   Alkaline Phosphatase 88 44 - 121 IU/L   AST 14 0 - 40 IU/L   ALT 9 0 - 44 IU/L      Assessment & Plan:   Problem List Items Addressed This Visit       Cardiovascular and Mediastinum   Essential hypertension   Chronic, controlled. Currently managed with Lisinopril  20 mg PO every day, hydrochlorothiazide  12.5 mg PO every day Appears to be tolerating well BP appears to have improved since adding hydrochlorothiazide  - will need to continue monitoring CMP as he has continued concerns for fatigue Follow up in 3 months or sooner if concerns arise       Relevant Orders   Comp Met (CMET)   Coronary artery disease   Chronic, Controlled. Will restart Atorvastatin  as he said he has not had any for some time Continue aspirin  as well Continue Cardiology collaboration  Follow up in 3 months or sooner if concerns arise       Aortic atherosclerosis  (HCC) - Primary   Chronic.  Continue with ASA and Atorvastatin . Atorvastatin  refilled by Cardiology.  Labs ordered today.  Return to clinic in 3 months for reevaluation.  Call sooner if concerns arise.         Respiratory   COPD (chronic obstructive pulmonary disease) (HCC)   Chronic.  Wheezing on exam with decreased air movement.  Will treat with medrol dose pak and albuterol .  Follow up in 2 weeks for lung recheck.  Xray ordered to evaluate for pneumonia.       Relevant Medications   albuterol  (VENTOLIN  HFA) 108 (90 Base) MCG/ACT inhaler   methylPREDNISolone (MEDROL DOSEPAK) 4 MG TBPK tablet   Other Relevant Orders   DG Chest 2 View     Endocrine   IFG (impaired fasting glucose)   Labs ordered at visit today.  Will make recommendations based on lab results.       Relevant Orders   HgB A1c     Other   Hypercholesteremia   Chronic, historical condition Appears well-controlled at this time Recheck lipid panel today-results to dictate further management For now continue current regimen comprised of atorvastatin  40 mg p.o. daily Follow-up in 6 months or sooner if concerns arise      Other fatigue   Continues to follow up with Neurology.  Most recent note reviewed.  Declines sleep study.        Vitamin D  deficiency   Chronic.  Controlled.  Continue with current medication regimen.  Labs ordered today.  Return to clinic in 6 months for reevaluation.  Call sooner if concerns arise.        Relevant Orders   Vitamin D  (25 hydroxy)       Follow up plan: Return in about 2 weeks (around 05/03/2024) for Lung check.

## 2024-04-19 NOTE — Assessment & Plan Note (Signed)
 Chronic, controlled. Currently managed with Lisinopril 20 mg PO every day, hydrochlorothiazide 12.5 mg PO every day Appears to be tolerating well BP appears to have improved since adding hydrochlorothiazide - will need to continue monitoring CMP as he has continued concerns for fatigue Follow up in 3 months or sooner if concerns arise

## 2024-04-19 NOTE — Assessment & Plan Note (Signed)
 Chronic, Controlled. Will restart Atorvastatin as he said he has not had any for some time Continue aspirin as well Continue Cardiology collaboration  Follow up in 3 months or sooner if concerns arise

## 2024-04-19 NOTE — Assessment & Plan Note (Signed)
Chronic.  Continue with ASA and Atorvastatin. Atorvastatin refilled by Cardiology.  Labs ordered today.  Return to clinic in 3 months for reevaluation.  Call sooner if concerns arise.

## 2024-04-19 NOTE — Assessment & Plan Note (Signed)
 Continues to follow up with Neurology.  Most recent note reviewed.  Declines sleep study.

## 2024-04-19 NOTE — Telephone Encounter (Signed)
 Requested medication (s) are due for refill today: yes   Requested medication (s) are on the active medication list: yes  Last refill:  lisinopril : 10/20/23 #90 1 RF               hydrochlorothiazide : 01/17/23 #90  Future visit scheduled: yes  Notes to clinic:  pt has appt today at 1400   Requested Prescriptions  Pending Prescriptions Disp Refills   lisinopril  (ZESTRIL ) 20 MG tablet [Pharmacy Med Name: Lisinopril  20 MG Oral Tablet] 90 tablet 0    Sig: Take 1 tablet by mouth once daily     Cardiovascular:  ACE Inhibitors Passed - 04/19/2024 12:57 PM      Passed - Cr in normal range and within 180 days    Creatinine, Ser  Date Value Ref Range Status  01/27/2024 1.15 0.76 - 1.27 mg/dL Final         Passed - K in normal range and within 180 days    Potassium  Date Value Ref Range Status  01/27/2024 4.4 3.5 - 5.2 mmol/L Final         Passed - Patient is not pregnant      Passed - Last BP in normal range    BP Readings from Last 1 Encounters:  01/17/24 110/68         Passed - Valid encounter within last 6 months    Recent Outpatient Visits           3 months ago Essential hypertension   [place] [facility] Aileen Alexanders, NP       Future Appointments             Today Aileen Alexanders, NP [place] [facility]             hydrochlorothiazide  (HYDRODIURIL ) 12.5 MG tablet [Pharmacy Med Name: hydroCHLOROthiazide  12.5 MG Oral Tablet] 90 tablet 0    Sig: Take 1 tablet by mouth once daily     Cardiovascular: Diuretics - Thiazide Passed - 04/19/2024 12:57 PM      Passed - Cr in normal range and within 180 days    Creatinine, Ser  Date Value Ref Range Status  01/27/2024 1.15 0.76 - 1.27 mg/dL Final         Passed - K in normal range and within 180 days    Potassium  Date Value Ref Range Status  01/27/2024 4.4 3.5 - 5.2 mmol/L Final         Passed - Na in normal range and within 180 days    Sodium  Date Value Ref Range  Status  01/27/2024 141 134 - 144 mmol/L Final         Passed - Last BP in normal range    BP Readings from Last 1 Encounters:  01/17/24 110/68         Passed - Valid encounter within last 6 months    Recent Outpatient Visits           3 months ago Essential hypertension   [place] [facility] Aileen Alexanders, NP       Future Appointments             Today Aileen Alexanders, NP [facility]

## 2024-04-19 NOTE — Assessment & Plan Note (Addendum)
 Chronic.  Wheezing on exam with decreased air movement.  Will treat with medrol dose pak and albuterol .  Follow up in 2 weeks for lung recheck.  Xray ordered to evaluate for pneumonia.

## 2024-04-19 NOTE — Assessment & Plan Note (Signed)
Chronic, historical condition Appears well-controlled at this time Recheck lipid panel today-results to dictate further management For now continue current regimen comprised of atorvastatin 40 mg p.o. daily Follow-up in 6 months or sooner if concerns arise

## 2024-04-19 NOTE — Assessment & Plan Note (Signed)
 Labs ordered at visit today.  Will make recommendations based on lab results.

## 2024-04-19 NOTE — Assessment & Plan Note (Signed)
 Chronic.  Controlled.  Continue with current medication regimen.  Labs ordered today.  Return to clinic in 6 months for reevaluation.  Call sooner if concerns arise.  ? ?

## 2024-04-20 ENCOUNTER — Ambulatory Visit: Payer: Self-pay | Admitting: Nurse Practitioner

## 2024-04-20 LAB — COMPREHENSIVE METABOLIC PANEL WITH GFR
ALT: 8 IU/L (ref 0–44)
AST: 14 IU/L (ref 0–40)
Albumin: 4.2 g/dL (ref 3.7–4.7)
Alkaline Phosphatase: 85 IU/L (ref 44–121)
BUN/Creatinine Ratio: 17 (ref 10–24)
BUN: 23 mg/dL (ref 8–27)
Bilirubin Total: 0.6 mg/dL (ref 0.0–1.2)
CO2: 25 mmol/L (ref 20–29)
Calcium: 9.6 mg/dL (ref 8.6–10.2)
Chloride: 103 mmol/L (ref 96–106)
Creatinine, Ser: 1.35 mg/dL — ABNORMAL HIGH (ref 0.76–1.27)
Globulin, Total: 2.5 g/dL (ref 1.5–4.5)
Glucose: 88 mg/dL (ref 70–99)
Potassium: 4.5 mmol/L (ref 3.5–5.2)
Sodium: 144 mmol/L (ref 134–144)
Total Protein: 6.7 g/dL (ref 6.0–8.5)
eGFR: 51 mL/min/{1.73_m2} — ABNORMAL LOW (ref >59)

## 2024-04-20 LAB — HEMOGLOBIN A1C
Est. average glucose Bld gHb Est-mCnc: 126 mg/dL
Hgb A1c MFr Bld: 6 % — ABNORMAL HIGH (ref 4.8–5.6)

## 2024-04-20 LAB — VITAMIN D 25 HYDROXY (VIT D DEFICIENCY, FRACTURES): Vit D, 25-Hydroxy: 38 ng/mL (ref 30.0–100.0)

## 2024-04-20 NOTE — Progress Notes (Signed)
 Subjective:   Ralph Leon is a 88 y.o. male who presents for Medicare Annual/Subsequent preventive examination.  Visit Complete: In person  Patient Medicare AWV questionnaire was completed by the patient on 04/19/2024; I have confirmed that all information answered by patient is correct and no changes since this date.  Cardiac Risk Factors include: advanced age (>61men, >56 women);male gender;hypertension;sedentary lifestyle     Objective:     Today's Vitals   04/19/24 1419  BP: 112/72  Pulse: 80  Resp: 16  Temp: 98.4 F (36.9 C)  TempSrc: Oral  SpO2: 96%  Weight: 132 lb 3.2 oz (60 kg)  Height: 5' 4.02" (1.626 m)  PainSc: 0-No pain   Body mass index is 22.68 kg/m.     04/19/2024    2:13 PM 02/01/2023    2:35 PM 11/26/2021   12:46 PM 11/25/2020    2:36 PM  Advanced Directives  Does Patient Have a Medical Advance Directive? No No No No  Would patient like information on creating a medical advance directive? No - Patient declined No - Patient declined No - Patient declined     Current Medications (verified) Outpatient Encounter Medications as of 04/19/2024  Medication Sig   albuterol  (VENTOLIN  HFA) 108 (90 Base) MCG/ACT inhaler Inhale 2 puffs into the lungs every 6 (six) hours as needed for wheezing or shortness of breath.   aspirin  EC 81 MG tablet Take 1 tablet (81 mg total) by mouth daily. Swallow whole.   atorvastatin  (LIPITOR) 40 MG tablet Take 1 tablet by mouth once daily   methylPREDNISolone  (MEDROL  DOSEPAK) 4 MG TBPK tablet Take as directed   [DISCONTINUED] hydrochlorothiazide  (HYDRODIURIL ) 12.5 MG tablet Take 1 tablet by mouth once daily   [DISCONTINUED] lisinopril  (ZESTRIL ) 20 MG tablet Take 1 tablet by mouth once daily   [DISCONTINUED] Vitamin D , Ergocalciferol , (DRISDOL ) 1.25 MG (50000 UNIT) CAPS capsule Take 1 capsule (50,000 Units total) by mouth every 7 (seven) days.   No facility-administered encounter medications on file as of 04/19/2024.    Allergies  (verified) Patient has no known allergies.   History: Past Medical History:  Diagnosis Date   COPD (chronic obstructive pulmonary disease) (HCC)    Hypertension    Stroke [facility])    History reviewed. No pertinent surgical history. Family History  Problem Relation Age of Onset   Diabetes Mother    Heart attack Father    Social History   Socioeconomic History   Marital status: Single    Spouse name: Not on file   Number of children: Not on file   Years of education: Not on file   Highest education level: Not on file  Occupational History   Occupation: retired  Tobacco Use   Smoking status: Every Day    Current packs/day: 0.25    Average packs/day: 0.3 packs/day for 64.0 years (16.0 ttl pk-yrs)    Types: Cigarettes   Smokeless tobacco: Never   Tobacco comments:    5-6 a day. 03/06/2021  Vaping Use   Vaping status: Never Used  Substance and Sexual Activity   Alcohol use: Not Currently   Drug use: Not Currently   Sexual activity: Not Currently  Other Topics Concern   Not on file  Social History Narrative   Not on file   Social Drivers of Health   Financial Resource Strain: Low Risk  (04/19/2024)   Overall Financial Resource Strain (CARDIA)    Difficulty of Paying Living Expenses: Not hard at all  Food Insecurity: No Food  Insecurity (04/19/2024)   Hunger Vital Sign    Worried About Running Out of Food in the Last Year: Never true    Ran Out of Food in the Last Year: Never true  Transportation Needs: No Transportation Needs (04/19/2024)   PRAPARE - Administrator, Civil Service (Medical): No    Lack of Transportation (Non-Medical): No  Physical Activity: Insufficiently Active (04/19/2024)   Exercise Vital Sign    Days of Exercise per Week: 2 days    Minutes of Exercise per Session: 10 min  Stress: No Stress Concern Present (04/19/2024)   [employer] of Occupational Health - Occupational Stress Questionnaire    Feeling of Stress : Not at all  Social  Connections: Socially Isolated (04/19/2024)   Social Connection and Isolation Panel [NHANES]    Frequency of Communication with Friends and Family: More than three times a week    Frequency of Social Gatherings with Friends and Family: Once a week    Attends Religious Services: Never    Database administrator or Organizations: No    Attends Banker Meetings: Never    Marital Status: Never married    Tobacco Counseling Ready to quit: Not Answered Counseling given: Not Answered Tobacco comments: 5-6 a day. 03/06/2021   Clinical Intake:  Pre-visit preparation completed: No  Pain : No/denies pain Pain Score: 0-No pain     BMI - recorded: 22.68 Nutritional Status: BMI of 19-24  Normal Nutritional Risks: None Diabetes: No  How often do you need to have someone help you when you read instructions, pamphlets, or other written materials from your doctor or pharmacy?: 1 - Never  Interpreter Needed?: No      Activities of Daily Living    04/19/2024    2:12 PM  In your present state of health, do you have any difficulty performing the following activities:  Hearing? 0  Vision? 0  Difficulty concentrating or making decisions? 0  Walking or climbing stairs? 1  Comment Uses walker  Dressing or bathing? 0  Doing errands, shopping? 0  Preparing Food and eating ? N  Using the Toilet? N  In the past six months, have you accidently leaked urine? Y  Comment Sometimes  Do you have problems with loss of bowel control? N  Managing your Medications? N  Managing your Finances? N  Housekeeping or managing your Housekeeping? N    Patient Care Team: Aileen Alexanders, NP as PCP - General Constancia Delton, MD as PCP - Cardiology (Cardiology)  Indicate any recent Medical Services you may have received from other than [facility] providers in the past year (date may be approximate).     Assessment:    This is a routine wellness examination for [employer].  Hearing/Vision  screen No results found.   Goals Addressed             This Visit's Progress    DIET - EAT MORE FRUITS AND VEGETABLES   On track      Depression Screen    04/19/2024    2:50 PM 08/12/2023    2:55 PM 07/15/2023    2:32 PM 04/14/2023    2:29 PM 03/03/2023    2:42 PM 02/01/2023    2:33 PM 02/01/2023    2:06 PM  PHQ 2/9 Scores  PHQ - 2 Score 0 3 1 2  0 1 1  PHQ- 9 Score  11 11 12 4 5 10     Fall Risk    04/19/2024  2:11 PM 08/12/2023    2:55 PM 07/15/2023    2:33 PM 04/14/2023    2:28 PM 02/01/2023    2:36 PM  Fall Risk   Falls in the past year? 0 0 0 0 0  Number falls in past yr: 0 0 0 0 0  Injury with Fall? 0 0 0 0 0  Risk for fall due to : Impaired balance/gait;Impaired mobility No Fall Risks No Fall Risks No Fall Risks No Fall Risks  Follow up Falls evaluation completed Falls evaluation completed Falls evaluation completed Falls evaluation completed Falls prevention discussed;Falls evaluation completed    MEDICARE RISK AT HOME: Medicare Risk at Home Any stairs in or around the home?: Yes If so, are there any without handrails?: Yes Home free of loose throw rugs in walkways, pet beds, electrical cords, etc?: No Adequate lighting in your home to reduce risk of falls?: Yes Life alert?: No Use of a cane, walker or w/c?: Yes (Walker) Grab bars in the bathroom?: Yes Shower chair or bench in shower?: No Elevated toilet seat or a handicapped toilet?: No  TIMED UP AND GO:  Was the test performed?  Yes  Length of time to ambulate 10 feet: 15 sec Gait slow and steady with assistive device    Cognitive Function:        04/19/2024    2:15 PM 02/01/2023    2:41 PM 11/26/2021   12:49 PM 11/25/2020    2:40 PM  6CIT Screen  What Year? 0 points 0 points 0 points 0 points  What month? 0 points 0 points 0 points 0 points  What time? 0 points 0 points 0 points 0 points  Count back from 20 0 points 0 points 0 points 2 points  Months in reverse 0 points 0 points 2 points 0 points   Repeat phrase 2 points 0 points 6 points 4 points  Total Score 2 points 0 points 8 points 6 points    Immunizations  There is no immunization history on file for this patient.  TDAP status: Due, Education has been provided regarding the importance of this vaccine. Advised may receive this vaccine at local pharmacy or Health Dept. Aware to provide a copy of the vaccination record if obtained from local pharmacy or Health Dept. Verbalized acceptance and understanding.  Flu Vaccine status: Declined, Education has been provided regarding the importance of this vaccine but patient still declined. Advised may receive this vaccine at local pharmacy or Health Dept. Aware to provide a copy of the vaccination record if obtained from local pharmacy or Health Dept. Verbalized acceptance and understanding.  Pneumococcal vaccine status: Declined,  Education has been provided regarding the importance of this vaccine but patient still declined. Advised may receive this vaccine at local pharmacy or Health Dept. Aware to provide a copy of the vaccination record if obtained from local pharmacy or Health Dept. Verbalized acceptance and understanding.   Covid-19 vaccine status: Declined, Education has been provided regarding the importance of this vaccine but patient still declined. Advised may receive this vaccine at local pharmacy or Health Dept.or vaccine clinic. Aware to provide a copy of the vaccination record if obtained from local pharmacy or Health Dept. Verbalized acceptance and understanding.  Qualifies for Shingles Vaccine? Yes   Zostavax completed No   Shingrix Completed?: No.    Education has been provided regarding the importance of this vaccine. Patient has been advised to call insurance company to determine out of pocket expense if they have  not yet received this vaccine. Advised may also receive vaccine at local pharmacy or Health Dept. Verbalized acceptance and understanding.  Screening  Tests Health Maintenance  Topic Date Due   COVID-19 Vaccine (1 - 2024-25 season) 05/05/2024 (Originally 07/25/2023)   Zoster Vaccines- Shingrix (1 of 2) 07/20/2024 (Originally 02/17/1988)   Pneumonia Vaccine 41+ Years old (1 of 2 - PCV) 04/19/2025 (Originally [birth year]16/1955)   INFLUENZA VACCINE  06/23/2024   Medicare Annual Wellness (AWV)  04/19/2025   HPV VACCINES  Aged Out   Meningococcal B Vaccine  Aged Out   DTaP/Tdap/Td  Discontinued    Health Maintenance  There are no preventive care reminders to display for this patient.  Colorectal cancer screening: No longer required.   Lung Cancer Screening: (Low Dose CT Chest recommended if Age 8-80 years, 20 pack-year currently smoking OR have quit w/in 15years.) does not qualify.   Additional Screening:  Hepatitis C Screening: does qualify; Completed N/A  Vision Screening: Recommended annual ophthalmology exams for early detection of glaucoma and other disorders of the eye. Is the patient up to date with their annual eye exam?  No   Dental Screening: Recommended annual dental exams for proper oral hygiene  Diabetic Foot Exam: N/A  Community Resource Referral / Chronic Care Management: CRR required this visit?  No   CCM required this visit?  No     Plan:     I have personally reviewed and noted the following in the patient's chart:   Medical and social history Use of alcohol, tobacco or illicit drugs  Current medications and supplements including opioid prescriptions. Patient is not currently taking opioid prescriptions. Functional ability and status Nutritional status Physical activity Advanced directives List of other physicians Hospitalizations, surgeries, and ER visits in previous 12 months Vitals Screenings to include cognitive, depression, and falls Referrals and appointments  In addition, I have reviewed and discussed with patient certain preventive protocols, quality metrics, and best practice recommendations. A  written personalized care plan for preventive services as well as general preventive health recommendations were provided to patient.     Jilda Most Kawehi Hostetter, CMA   04/20/2024   After Visit Summary: (In Person-Printed) AVS printed and given to the patient  Nurse Notes:

## 2024-05-03 ENCOUNTER — Ambulatory Visit (INDEPENDENT_AMBULATORY_CARE_PROVIDER_SITE_OTHER): Admitting: Nurse Practitioner

## 2024-05-03 ENCOUNTER — Encounter: Payer: Self-pay | Admitting: Nurse Practitioner

## 2024-05-03 ENCOUNTER — Telehealth: Payer: Self-pay

## 2024-05-03 VITALS — BP 117/67 | HR 81 | Temp 98.7°F | Resp 17 | Ht 64.02 in | Wt 131.0 lb

## 2024-05-03 DIAGNOSIS — J449 Chronic obstructive pulmonary disease, unspecified: Secondary | ICD-10-CM | POA: Diagnosis not present

## 2024-05-03 NOTE — Assessment & Plan Note (Signed)
 Chronic.  Improved from last visit.  No wheezing on exam.  Cough and SOB have improved.  Follow up in 3 months.  Call sooner if concerns arise.

## 2024-05-03 NOTE — Progress Notes (Signed)
 BP 117/67 (BP Location: Left Arm, Patient Position: Sitting, Cuff Size: Normal)   Pulse 81   Temp 98.7 F (37.1 C) (Oral)   Resp 17   Ht 5' 4.02 (1.626 m)   Wt 131 lb (59.4 kg)   SpO2 96%   BMI 22.47 kg/m    Subjective:    Patient ID: Ralph Leon, male    DOB: 1935-12-07, 88 y.o.   MRN: [id number]  HPI: Ralph Leon is a 88 y.o. male  Chief Complaint  Patient presents with   Follow-up    Feeling about the same   COPD Cough has improved.  No longer having SOB.  Completed course of antibiotics and prednisone. COPD status: controlled Satisfied with current treatment?: yes Oxygen use: no Dyspnea frequency:  Cough frequency:  Rescue inhaler frequency:   Limitation of activity: no Productive cough:  Last Spirometry:  Pneumovax: Not up to Date Influenza: Not up to Date  Relevant past medical, surgical, family and social history reviewed and updated as indicated. Interim medical history since our last visit reviewed. Allergies and medications reviewed and updated.  Review of Systems  Respiratory:  Negative for cough, shortness of breath and wheezing.     Per HPI unless specifically indicated above     Objective:     BP 117/67 (BP Location: Left Arm, Patient Position: Sitting, Cuff Size: Normal)   Pulse 81   Temp 98.7 F (37.1 C) (Oral)   Resp 17   Ht 5' 4.02 (1.626 m)   Wt 131 lb (59.4 kg)   SpO2 96%   BMI 22.47 kg/m   Wt Readings from Last 3 Encounters:  05/03/24 131 lb (59.4 kg)  04/19/24 132 lb 3.2 oz (60 kg)  01/17/24 135 lb (61.2 kg)    Physical Exam Vitals and nursing note reviewed.  Constitutional:      General: He is not in acute distress.    Appearance: Normal appearance. He is not ill-appearing, toxic-appearing or diaphoretic.  HENT:     Head: Normocephalic.     Right Ear: External ear normal.     Left Ear: External ear normal.     Nose: Nose normal. No congestion or rhinorrhea.     Mouth/Throat:     Mouth: Mucous membranes are  moist.  Eyes:     General:        Right eye: No discharge.        Left eye: No discharge.     Extraocular Movements: Extraocular movements intact.     Conjunctiva/sclera: Conjunctivae normal.     Pupils: Pupils are equal, round, and reactive to light.  Cardiovascular:     Rate and Rhythm: Normal rate and regular rhythm.     Heart sounds: No murmur heard. Pulmonary:     Effort: Pulmonary effort is normal. No respiratory distress.     Breath sounds: Normal breath sounds. No wheezing, rhonchi or rales.  Abdominal:     General: Abdomen is flat. Bowel sounds are normal.  Musculoskeletal:     Cervical back: Normal range of motion and neck supple.  Skin:    General: Skin is warm and dry.     Capillary Refill: Capillary refill takes less than 2 seconds.  Neurological:     General: No focal deficit present.     Mental Status: He is alert and oriented to person, place, and time.  Psychiatric:        Mood and Affect: Mood normal.  Behavior: Behavior normal.        Thought Content: Thought content normal.        Judgment: Judgment normal.     Results for orders placed or performed in visit on 04/19/24  Comp Met (CMET)   Collection Time: 04/19/24  2:42 PM  Result Value Ref Range   Glucose 88 70 - 99 mg/dL   BUN 23 8 - 27 mg/dL   Creatinine, Ser 4.09 (H) 0.76 - 1.27 mg/dL   eGFR 51 (L) >81 XB/JYN/8.29   BUN/Creatinine Ratio 17 10 - 24   Sodium 144 134 - 144 mmol/L   Potassium 4.5 3.5 - 5.2 mmol/L   Chloride 103 96 - 106 mmol/L   CO2 25 20 - 29 mmol/L   Calcium  9.6 8.6 - 10.2 mg/dL   Total Protein 6.7 6.0 - 8.5 g/dL   Albumin 4.2 3.7 - 4.7 g/dL   Globulin, Total 2.5 1.5 - 4.5 g/dL   Bilirubin Total 0.6 0.0 - 1.2 mg/dL   Alkaline Phosphatase 85 44 - 121 IU/L   AST 14 0 - 40 IU/L   ALT 8 0 - 44 IU/L  Vitamin D  (25 hydroxy)   Collection Time: 04/19/24  2:42 PM  Result Value Ref Range   Vit D, 25-Hydroxy 38.0 30.0 - 100.0 ng/mL  HgB A1c   Collection Time: 04/19/24  2:42  PM  Result Value Ref Range   Hgb A1c MFr Bld 6.0 (H) 4.8 - 5.6 %   Est. average glucose Bld gHb Est-mCnc 126 mg/dL      Assessment & Plan:   Problem List Items Addressed This Visit       Respiratory   COPD (chronic obstructive pulmonary disease) (HCC) - Primary   Chronic.  Improved from last visit.  No wheezing on exam.  Cough and SOB have improved.  Follow up in 3 months.  Call sooner if concerns arise.        Follow up plan: Return in about 3 months (around 08/03/2024) for HTN, HLD, DM2 FU.

## 2024-05-03 NOTE — Telephone Encounter (Signed)
 Call to patient and asked that he come to his appointment at 3:40 pm rather than 4:20 pm.

## 2024-05-09 ENCOUNTER — Other Ambulatory Visit: Payer: Self-pay | Admitting: Nurse Practitioner

## 2024-05-11 NOTE — Telephone Encounter (Signed)
 Requested Prescriptions  Pending Prescriptions Disp Refills   atorvastatin  (LIPITOR) 40 MG tablet [Pharmacy Med Name: Atorvastatin  Calcium  40 MG Oral Tablet] 90 tablet 1    Sig: Take 1 tablet by mouth once daily     Cardiovascular:  Antilipid - Statins Failed - 05/11/2024 11:16 AM      Failed - Lipid Panel in normal range within the last 12 months    Cholesterol, Total  Date Value Ref Range Status  10/12/2023 159 100 - 199 mg/dL Final   LDL Chol Calc (NIH)  Date Value Ref Range Status  10/12/2023 73 0 - 99 mg/dL Final   HDL  Date Value Ref Range Status  10/12/2023 71 >39 mg/dL Final   Triglycerides  Date Value Ref Range Status  10/12/2023 80 0 - 149 mg/dL Final         Passed - Patient is not pregnant      Passed - Valid encounter within last 12 months    Recent Outpatient Visits           1 week ago Chronic obstructive pulmonary disease, unspecified COPD type (HCC)   [place] [facility] Aileen Alexanders, NP   3 weeks ago Encounter for [employer] annual wellness exam   [place] [facility] Aileen Alexanders, NP   3 months ago Essential hypertension    [facility] Aileen Alexanders, NP

## 2024-07-16 ENCOUNTER — Other Ambulatory Visit: Payer: Self-pay | Admitting: Nurse Practitioner

## 2024-07-16 DIAGNOSIS — I10 Essential (primary) hypertension: Secondary | ICD-10-CM

## 2024-07-17 NOTE — Telephone Encounter (Signed)
 Requested Prescriptions  Pending Prescriptions Disp Refills   lisinopril  (ZESTRIL ) 20 MG tablet [Pharmacy Med Name: Lisinopril  20 MG Oral Tablet] 90 tablet 1    Sig: Take 1 tablet by mouth once daily     Cardiovascular:  ACE Inhibitors Failed - 07/17/2024  5:31 PM      Failed - Cr in normal range and within 180 days    Creatinine, Ser  Date Value Ref Range Status  04/19/2024 1.35 (H) 0.76 - 1.27 mg/dL Final         Passed - K in normal range and within 180 days    Potassium  Date Value Ref Range Status  04/19/2024 4.5 3.5 - 5.2 mmol/L Final         Passed - Patient is not pregnant      Passed - Last BP in normal range    BP Readings from Last 1 Encounters:  05/03/24 117/67         Passed - Valid encounter within last 6 months    Recent Outpatient Visits           2 months ago Chronic obstructive pulmonary disease, unspecified COPD type (HCC)   [place] [facility] [name], NP   2 months ago Encounter for [employer] annual wellness exam   [place] [facility] [name], NP   6 months ago Essential hypertension   [place] [facility] [name], NP               hydrochlorothiazide  (HYDRODIURIL ) 12.5 MG tablet [Pharmacy Med Name: HYDROCHLOROTHIAZIDE  12.5MG  TAB] 90 tablet 1    Sig: Take 1 tablet by mouth once daily     Cardiovascular: Diuretics - Thiazide Failed - 07/17/2024  5:31 PM      Failed - Cr in normal range and within 180 days    Creatinine, Ser  Date Value Ref Range Status  04/19/2024 1.35 (H) 0.76 - 1.27 mg/dL Final         Passed - K in normal range and within 180 days    Potassium  Date Value Ref Range Status  04/19/2024 4.5 3.5 - 5.2 mmol/L Final         Passed - Na in normal range and within 180 days    Sodium  Date Value Ref Range Status  04/19/2024 144 134 - 144 mmol/L Final         Passed - Last BP in normal range    BP Readings from Last 1 Encounters:  05/03/24  117/67         Passed - Valid encounter within last 6 months    Recent Outpatient Visits           2 months ago Chronic obstructive pulmonary disease, unspecified COPD type (HCC)   [place] [facility] [name], NP   2 months ago Encounter for [employer] annual wellness exam   [place] [facility] [name], NP   6 months ago Essential hypertension   [place] [facility] [name], NP

## 2024-08-03 ENCOUNTER — Ambulatory Visit: Admitting: Nurse Practitioner

## 2024-08-06 ENCOUNTER — Telehealth: Payer: Self-pay

## 2024-08-06 NOTE — Telephone Encounter (Signed)
 Called patient to inform that his scheduled appointment for tomorrow 08/07/2024 with PCP has been cancelled. He asks that we reschedule quick as he is not feeling well. Upon questioning he states he fell a couple times a few days ago but was never seen and has a few other things going on that he needs to be seen for. I tried to encourage the ED if he is not well but says that is not needed. I let him know I would reach out or our admin team would reach out ASAP to get him in the office with either another provider or another office if needed. He agrees and appreciates the call.

## 2024-08-07 ENCOUNTER — Ambulatory Visit (INDEPENDENT_AMBULATORY_CARE_PROVIDER_SITE_OTHER): Admitting: Nurse Practitioner

## 2024-08-07 ENCOUNTER — Ambulatory Visit: Admitting: Nurse Practitioner

## 2024-08-07 ENCOUNTER — Encounter: Payer: Self-pay | Admitting: Nurse Practitioner

## 2024-08-07 VITALS — BP 115/69 | HR 72 | Temp 98.2°F | Resp 15 | Ht 64.02 in | Wt 130.0 lb

## 2024-08-07 DIAGNOSIS — R8281 Pyuria: Secondary | ICD-10-CM

## 2024-08-07 DIAGNOSIS — R399 Unspecified symptoms and signs involving the genitourinary system: Secondary | ICD-10-CM

## 2024-08-07 DIAGNOSIS — J449 Chronic obstructive pulmonary disease, unspecified: Secondary | ICD-10-CM

## 2024-08-07 DIAGNOSIS — W19XXXA Unspecified fall, initial encounter: Secondary | ICD-10-CM

## 2024-08-07 MED ORDER — AMOXICILLIN-POT CLAVULANATE 875-125 MG PO TABS
1.0000 | ORAL_TABLET | Freq: Two times a day (BID) | ORAL | 0 refills | Status: AC
Start: 2024-08-07 — End: 2024-08-14

## 2024-08-07 MED ORDER — METHYLPREDNISOLONE 4 MG PO TBPK: ORAL_TABLET | ORAL | 0 refills | Status: DC

## 2024-08-07 NOTE — Assessment & Plan Note (Signed)
 Recent falls without injury, suspect related to current infection.  Refer to COPD and UTI plan of care.

## 2024-08-07 NOTE — Patient Instructions (Signed)
 Urinary Tract Infection, Male A urinary tract infection (UTI) is an infection in your urinary tract. The urinary tract is made up of the organs that make, store, and get rid of pee (urine) in your body. These organs include: The kidneys. The ureters. The bladder. The urethra. What are the causes? Most UTIs are caused by germs called bacteria. They may be in or near your genitals. These germs grow and cause swelling in your urinary tract. What increases the risk? You're more likely to get a UTI if: You have a soft tube called a catheter that drains your pee. You can't control when you pee or poop. You have trouble peeing because of: A prostate that's bigger than normal. A kidney stone. A urinary blockage. A nerve condition that affects your bladder. Not getting enough to drink. You're sexually active. You're an older adult. You're also more likely to get a UTI if you have other health problems, such as: Diabetes. A weak immune system. Your immune system is your body's defense system. Sickle cell disease. Injury of the spine. What are the signs or symptoms? Symptoms may include: Needing to pee right away. Peeing small amounts often. Trouble getting the stream started. Pain or burning when you pee. Blood in your pee. Pee that smells bad or odd. Pain in your belly or lower back. You may also: Feel confused. This may be the first symptom in older adults. Vomit. Not feel hungry. Feel tired or easily annoyed. Have a fever or chills. How is this diagnosed? A UTI is diagnosed based on your medical history and an exam. You may also have other tests. These may include: Pee tests. Blood tests. Tests for sexually transmitted infections (STIs). If you've had more than one UTI, you may need to have imaging studies done to find out why you keep getting them. How is this treated? A UTI can be treated by: Taking antibiotics or other medicines. Drinking enough fluid to keep your pee  pale yellow. In rare cases, a UTI can cause a very bad condition called sepsis. Sepsis may be treated in the hospital. Follow these instructions at home: Medicines Take your medicines only as told by your health care provider. If you were given antibiotics, take them as told by your provider. Do not stop taking them even if you start to feel better. General instructions Make sure you: Pee often and fully. Do not hold your pee for a long time. Pee after you have sex. Contact a health care provider if: Your symptoms don't get better after 1-2 days of taking antibiotics. Your symptoms go away and then come back. You have a fever or chills. You vomit or feel like you may vomit. Get help right away if: You have very bad pain in your back or lower belly. You faint. This information is not intended to replace advice given to you by your health care provider. Make sure you discuss any questions you have with your health care provider. Document Revised: 10/20/2023 Document Reviewed: 02/12/2023 Elsevier Patient Education  2025 [employer].

## 2024-08-07 NOTE — Assessment & Plan Note (Signed)
 Acute for 2 weeks of symptoms with increased falls recently. UA with multiple positive findings.  Will send for culture to further assess.  Start Augmentin  BID for 7 days to cover lungs and urine.  Adjust regimen as needed based on culture.  Recommend he increase water intake at home.

## 2024-08-07 NOTE — Progress Notes (Signed)
 BP 115/69 (BP Location: Left Arm, Patient Position: Sitting, Cuff Size: Normal)   Pulse 72   Temp 98.2 F (36.8 C) (Oral)   Resp 15   Ht 5' 4.02 (1.626 m)   Wt 130 lb (59 kg)   SpO2 99%   BMI 22.30 kg/m    Subjective:    Patient ID: Ralph Leon, male    DOB: 08-May-1936, 88 y.o.   MRN: [id number]  HPI: Ralph Leon is a 88 y.o. male  Chief Complaint  Patient presents with   Fall    Felt week last weekend and fell a few times. Tired,  not feeling well, no pain just really tired. Says he did not hit his head. Later says he also tripped a few days ago trying to get out of the bath.    Urinary Incontinence    Ongoing about 2 weeks now. When he has to go its hard and painful. Does mention some confusion.    Living situation    States he is now staying with a long time friend as he no longer feels safe being at home by himself. He can no longer care for himself like he used to, can't fix his meals.    COPD Has Albuterol  to use as needed. Rarely uses. Stopped smoking 2 weeks ago. COPD status: exacerbated Satisfied with current treatment?: yes Oxygen use: no Dyspnea frequency: intermittent Cough frequency: a little more Rescue inhaler frequency:  not using Limitation of activity: no Productive cough: a little  Last Spirometry: unknown Pneumovax: refuses Influenza: refuses   URINARY SYMPTOMS Has had burning with urination for 2 weeks. Recently has been having falls.  Clemens one week ago.  Has been tired and not feeling well.  No recent injuries with falls.  Currently staying with friend as does not feel safe on own. Could take care of self on own well, up until one week ago.  Would rather not live in facility unless can help it. Makes own meals -- sandwiches or heats up canned stuff.  Does not have Meals on Wheels.   Dysuria: burning Urinary frequency: yes Urgency: leaks out on occasion, wears briefs for this Small volume voids: yes Symptom severity: yes Urinary  incontinence: yes Foul odor: yes Hematuria: no Abdominal pain: no Back pain: no Suprapubic pain/pressure: no Flank pain: no Fever:  no Vomiting: no Status: stable Previous urinary tract infection: yes Recurrent urinary tract infection: no Sexual activity: No sexually active Treatments attempted: increasing fluids    Relevant past medical, surgical, family and social history reviewed and updated as indicated. Interim medical history since our last visit reviewed. Allergies and medications reviewed and updated.  Review of Systems  Constitutional:  Positive for appetite change and fatigue. Negative for activity change, diaphoresis and fever.  Respiratory:  Positive for cough, shortness of breath and wheezing. Negative for chest tightness.   Cardiovascular:  Negative for chest pain, palpitations and leg swelling.  Gastrointestinal: Negative.   Genitourinary:  Positive for decreased urine volume, dysuria, frequency and urgency. Negative for hematuria and penile discharge.  Neurological: Negative.   Psychiatric/Behavioral: Negative.      Per HPI unless specifically indicated above     Objective:    BP 115/69 (BP Location: Left Arm, Patient Position: Sitting, Cuff Size: Normal)   Pulse 72   Temp 98.2 F (36.8 C) (Oral)   Resp 15   Ht 5' 4.02 (1.626 m)   Wt 130 lb (59 kg)   SpO2 99%   BMI  22.30 kg/m   Wt Readings from Last 3 Encounters:  08/07/24 130 lb (59 kg)  05/03/24 131 lb (59.4 kg)  04/19/24 132 lb 3.2 oz (60 kg)    Physical Exam Vitals and nursing note reviewed.  Constitutional:      General: He is awake. He is not in acute distress.    Appearance: He is well-developed, well-groomed and underweight. He is not ill-appearing or toxic-appearing.  HENT:     Head: Normocephalic.     Right Ear: Hearing and external ear normal.     Left Ear: Hearing and external ear normal.  Eyes:     General: Lids are normal.     Extraocular Movements: Extraocular movements  intact.     Conjunctiva/sclera: Conjunctivae normal.  Neck:     Thyroid: No thyromegaly.     Vascular: No carotid bruit.  Cardiovascular:     Rate and Rhythm: Normal rate and regular rhythm.     Heart sounds: Normal heart sounds. No murmur heard.    No gallop.  Pulmonary:     Effort: No accessory muscle usage or respiratory distress.     Breath sounds: Wheezing present. No decreased breath sounds or rales.     Comments: Expiratory wheezes noted throughout. No SOB with talking. Abdominal:     General: Bowel sounds are normal. There is no distension.     Palpations: Abdomen is soft.     Tenderness: There is no abdominal tenderness.  Musculoskeletal:     Cervical back: Full passive range of motion without pain.     Right lower leg: No edema.     Left lower leg: No edema.  Lymphadenopathy:     Cervical: No cervical adenopathy.  Skin:    General: Skin is warm.     Capillary Refill: Capillary refill takes less than 2 seconds.  Neurological:     Mental Status: He is alert and oriented to person, place, and time.     Deep Tendon Reflexes: Reflexes are normal and symmetric.     Reflex Scores:      Brachioradialis reflexes are 2+ on the right side and 2+ on the left side.      Patellar reflexes are 2+ on the right side and 2+ on the left side.    Comments: Oriented to person, place, and time.    Psychiatric:        Attention and Perception: Attention normal.        Mood and Affect: Mood normal.        Speech: Speech normal.        Behavior: Behavior normal. Behavior is cooperative.        Thought Content: Thought content normal.     Results for orders placed or performed in visit on 04/19/24  Comp Met (CMET)   Collection Time: 04/19/24  2:42 PM  Result Value Ref Range   Glucose 88 70 - 99 mg/dL   BUN 23 8 - 27 mg/dL   Creatinine, Ser 8.64 (H) 0.76 - 1.27 mg/dL   eGFR 51 (L) >40 fO/fpw/8.26   BUN/Creatinine Ratio 17 10 - 24   Sodium 144 134 - 144 mmol/L   Potassium 4.5 3.5 -  5.2 mmol/L   Chloride 103 96 - 106 mmol/L   CO2 25 20 - 29 mmol/L   Calcium  9.6 8.6 - 10.2 mg/dL   Total Protein 6.7 6.0 - 8.5 g/dL   Albumin 4.2 3.7 - 4.7 g/dL   Globulin, Total 2.5 1.5 -  4.5 g/dL   Bilirubin Total 0.6 0.0 - 1.2 mg/dL   Alkaline Phosphatase 85 44 - 121 IU/L   AST 14 0 - 40 IU/L   ALT 8 0 - 44 IU/L  Vitamin D  (25 hydroxy)   Collection Time: 04/19/24  2:42 PM  Result Value Ref Range   Vit D, 25-Hydroxy 38.0 30.0 - 100.0 ng/mL  HgB A1c   Collection Time: 04/19/24  2:42 PM  Result Value Ref Range   Hgb A1c MFr Bld 6.0 (H) 4.8 - 5.6 %   Est. average glucose Bld gHb Est-mCnc 126 mg/dL      Assessment & Plan:   Problem List Items Addressed This Visit       Respiratory   COPD (chronic obstructive pulmonary disease) (HCC) - Primary   Chronic, exacerbated.  Start Augmentin  BID for 7 days and steroid taper. Suspect UTI and exacerbation are cause for recent increased falls.  Educated his friend and him on this.  Referral to [facility] for assist with community resources like Meals on Wheels and ?aide in home a few days a week, this would be beneficial for him.  Would benefit from maintenance inhaler in future.      Relevant Medications   methylPREDNISolone  (MEDROL  DOSEPAK) 4 MG TBPK tablet   Other Relevant Orders   AMB Referral [facility] Care Management     Other   Urinary symptom or sign   Acute for 2 weeks of symptoms with increased falls recently. UA with multiple positive findings.  Will send for culture to further assess.  Start Augmentin  BID for 7 days to cover lungs and urine.  Adjust regimen as needed based on culture.  Recommend he increase water intake at home.        Relevant Orders   Urinalysis, Routine w reflex microscopic   Fall   Recent falls without injury, suspect related to current infection.  Refer to COPD and UTI plan of care.      Other Visit Diagnoses       Pyuria       Urine sent for culture.   Relevant Orders   Urine Culture        Follow  up plan: Return for scheduled for Monday with Darice.

## 2024-08-07 NOTE — Telephone Encounter (Signed)
 Scheduled with Jolene for 9/15

## 2024-08-07 NOTE — Assessment & Plan Note (Signed)
 Chronic, exacerbated.  Start Augmentin  BID for 7 days and steroid taper. Suspect UTI and exacerbation are cause for recent increased falls.  Educated his friend and him on this.  Referral to [facility] for assist with community resources like Meals on Wheels and ?aide in home a few days a week, this would be beneficial for him.  Would benefit from maintenance inhaler in future.

## 2024-08-08 ENCOUNTER — Ambulatory Visit: Admitting: Family Medicine

## 2024-08-08 ENCOUNTER — Telehealth: Payer: Self-pay | Admitting: *Deleted

## 2024-08-08 LAB — URINALYSIS, ROUTINE W REFLEX MICROSCOPIC
Bilirubin, UA: NEGATIVE
Glucose, UA: NEGATIVE
Nitrite, UA: POSITIVE — AB
Specific Gravity, UA: 1.02 (ref 1.005–1.030)
Urobilinogen, Ur: 1 mg/dL (ref 0.2–1.0)
pH, UA: 6 (ref 5.0–7.5)

## 2024-08-08 LAB — MICROSCOPIC EXAMINATION

## 2024-08-08 NOTE — Patient Outreach (Signed)
 Referral received from patient's provider to assess/provide in home support needs. Patient states that he is currently residing with a friend who is assisting with meals, medications and transportation to medical appointments. Patient states that he is not sure when he will return home but will probably need additional support upon his return as he lives alone.   Initial appointment scheduled for 08/16/24 at 4pm.   Lenn Mean, LCSW   [facility], [facility] Licensed Clinical Social Worker  Direct Dial: [phone]

## 2024-08-10 LAB — URINE CULTURE

## 2024-08-11 ENCOUNTER — Ambulatory Visit: Payer: Self-pay | Admitting: Nurse Practitioner

## 2024-08-11 NOTE — Progress Notes (Signed)
 Please let Ralph Leon know the Augmentin  should work well for his urinary infection. The culture shows it is susceptible. Continue this until completed:) Have a great day!!

## 2024-08-14 ENCOUNTER — Ambulatory Visit (INDEPENDENT_AMBULATORY_CARE_PROVIDER_SITE_OTHER): Admitting: Nurse Practitioner

## 2024-08-14 ENCOUNTER — Encounter: Payer: Self-pay | Admitting: Nurse Practitioner

## 2024-08-14 VITALS — BP 110/61 | HR 89 | Temp 98.7°F | Ht 64.0 in | Wt 128.2 lb

## 2024-08-14 DIAGNOSIS — R319 Hematuria, unspecified: Secondary | ICD-10-CM | POA: Diagnosis not present

## 2024-08-14 DIAGNOSIS — R634 Abnormal weight loss: Secondary | ICD-10-CM | POA: Insufficient documentation

## 2024-08-14 DIAGNOSIS — N390 Urinary tract infection, site not specified: Secondary | ICD-10-CM

## 2024-08-14 DIAGNOSIS — J449 Chronic obstructive pulmonary disease, unspecified: Secondary | ICD-10-CM | POA: Diagnosis not present

## 2024-08-14 LAB — URINALYSIS, ROUTINE W REFLEX MICROSCOPIC
Bilirubin, UA: NEGATIVE
Glucose, UA: NEGATIVE
Ketones, UA: NEGATIVE
Nitrite, UA: NEGATIVE
RBC, UA: NEGATIVE
Specific Gravity, UA: 1.015 (ref 1.005–1.030)
Urobilinogen, Ur: 1 mg/dL (ref 0.2–1.0)
pH, UA: 6.5 (ref 5.0–7.5)

## 2024-08-14 LAB — MICROSCOPIC EXAMINATION: RBC, Urine: NONE SEEN /HPF (ref 0–2)

## 2024-08-14 MED ORDER — ALBUTEROL SULFATE HFA 108 (90 BASE) MCG/ACT IN AERS: 2.0000 | INHALATION_SPRAY | Freq: Four times a day (QID) | RESPIRATORY_TRACT | 0 refills | Status: DC | PRN

## 2024-08-14 NOTE — Assessment & Plan Note (Signed)
 Symptoms have resolved.  Should be completing antibiotics today or tomorrow.  Reports completing prednisone. Follow up in 2 weeks.  Reports no further falls.

## 2024-08-14 NOTE — Progress Notes (Signed)
 BP 110/61   Pulse 89   Temp 98.7 F (37.1 C) (Oral)   Ht 5' 4 (1.626 m)   Wt 128 lb 3.2 oz (58.2 kg)   SpO2 100%   BMI 22.01 kg/m    Subjective:    Patient ID: Ralph Leon, male    DOB: 08-24-1936, 88 y.o.   MRN: [id number]  HPI: Ralph Leon is a 88 y.o. male  Chief Complaint  Patient presents with   Hyperlipidemia   Hypertension   COPD Patient states he is feeling a little better than last week.  He was able to pick up the augmentin  and start the therapy.  Reports he is currently taking the medication.  He is currently living with a friend and not sure how long he will be there.  He plans to go home at some point.  However, he is not feeling like he can do it right now.  He denies any concerns with urinalysis.  He is concerned that he has lost a little bit of weight but seems to be eating the same amount. He and his friend who is with him today deny any more falls since last visit.  He is also taking the prednisone that was given last week.  He feels like his breathing is improved from last visit.  He has not been using the albuterol .  He reports eating three meals a day.  He feels like this is a little more than when he was at home by himself.   COPD status: exacerbated Satisfied with current treatment?: yes Oxygen use: no Dyspnea frequency: intermittent Cough frequency: a little more Rescue inhaler frequency:  not using Limitation of activity: no Productive cough: a little  Last Spirometry: unknown Pneumovax: refuses Influenza: refuses     Relevant past medical, surgical, family and social history reviewed and updated as indicated. Interim medical history since our last visit reviewed. Allergies and medications reviewed and updated.  Review of Systems  Constitutional:  Negative for activity change, appetite change, diaphoresis, fatigue and fever.  Respiratory:  Negative for cough, chest tightness, shortness of breath and wheezing.   Cardiovascular:  Negative  for chest pain, palpitations and leg swelling.  Gastrointestinal: Negative.   Genitourinary:  Negative for decreased urine volume, dysuria, frequency, hematuria, penile discharge and urgency.  Neurological: Negative.   Psychiatric/Behavioral: Negative.      Per HPI unless specifically indicated above     Objective:    BP 110/61   Pulse 89   Temp 98.7 F (37.1 C) (Oral)   Ht 5' 4 (1.626 m)   Wt 128 lb 3.2 oz (58.2 kg)   SpO2 100%   BMI 22.01 kg/m   Wt Readings from Last 3 Encounters:  08/14/24 128 lb 3.2 oz (58.2 kg)  08/07/24 130 lb (59 kg)  05/03/24 131 lb (59.4 kg)    Physical Exam Vitals and nursing note reviewed.  Constitutional:      General: He is awake. He is not in acute distress.    Appearance: He is well-developed, well-groomed and underweight. He is not ill-appearing or toxic-appearing.  HENT:     Head: Normocephalic.     Right Ear: Hearing and external ear normal.     Left Ear: Hearing and external ear normal.  Eyes:     General: Lids are normal.     Extraocular Movements: Extraocular movements intact.     Conjunctiva/sclera: Conjunctivae normal.  Neck:     Thyroid: No thyromegaly.  Vascular: No carotid bruit.  Cardiovascular:     Rate and Rhythm: Normal rate and regular rhythm.     Heart sounds: Normal heart sounds. No murmur heard.    No gallop.  Pulmonary:     Effort: No accessory muscle usage or respiratory distress.     Breath sounds: No decreased breath sounds, wheezing or rales.     Comments: Expiratory wheezes noted throughout. No SOB with talking. Abdominal:     General: Bowel sounds are normal. There is no distension.     Palpations: Abdomen is soft.     Tenderness: There is no abdominal tenderness.  Musculoskeletal:     Cervical back: Full passive range of motion without pain.     Right lower leg: No edema.     Left lower leg: No edema.  Lymphadenopathy:     Cervical: No cervical adenopathy.  Skin:    General: Skin is warm.      Capillary Refill: Capillary refill takes less than 2 seconds.  Neurological:     Mental Status: He is alert and oriented to person, place, and time.     Deep Tendon Reflexes: Reflexes are normal and symmetric.     Reflex Scores:      Brachioradialis reflexes are 2+ on the right side and 2+ on the left side.      Patellar reflexes are 2+ on the right side and 2+ on the left side.    Comments: Oriented to person, place, and time.    Psychiatric:        Attention and Perception: Attention normal.        Mood and Affect: Mood normal.        Speech: Speech normal.        Behavior: Behavior normal. Behavior is cooperative.        Thought Content: Thought content normal.     Results for orders placed or performed in visit on 08/07/24  Microscopic Examination   Collection Time: 08/07/24  4:37 PM   Urine  Result Value Ref Range   WBC, UA 11-30 (A) 0 - 5 /hpf   RBC, Urine 0-2 0 - 2 /hpf   Epithelial Cells (non renal) 0-10 0 - 10 /hpf   Mucus, UA Present (A) Not Estab.   Bacteria, UA Many (A) None seen/Few  Urinalysis, Routine w reflex microscopic   Collection Time: 08/07/24  4:37 PM  Result Value Ref Range   Specific Gravity, UA 1.020 1.005 - 1.030   pH, UA 6.0 5.0 - 7.5   Color, UA Yellow Yellow   Appearance Ur Cloudy (A) Clear   Leukocytes,UA 1+ (A) Negative   Protein,UA 2+ (A) Negative/Trace   Glucose, UA Negative Negative   Ketones, UA Trace (A) Negative   RBC, UA 1+ (A) Negative   Bilirubin, UA Negative Negative   Urobilinogen, Ur 1.0 0.2 - 1.0 mg/dL   Nitrite, UA Positive (A) Negative   Microscopic Examination See below:   Urine Culture   Collection Time: 08/07/24  4:48 PM   Specimen: Urine   UR  Result Value Ref Range   Urine Culture, Routine Final report (A)    Organism ID, Bacteria Escherichia coli (A)    Antimicrobial Susceptibility Comment       Assessment & Plan:   Problem List Items Addressed This Visit       Respiratory   COPD (chronic obstructive  pulmonary disease) (HCC) - Primary   Symptoms have resolved.  Should be completing antibiotics  today or tomorrow.  Reports completing prednisone. Follow up in 2 weeks.  Reports no further falls.      Relevant Medications   albuterol  (VENTOLIN  HFA) 108 (90 Base) MCG/ACT inhaler     Other   Weight loss   Lost 2 lbs since last week. Reports eating more than he was now that he is living with a friend.  Does plan to go back home but not sure when.  Declines trying boost or ensure prior between meals.  Encouraged to eat a high protein meal. Follow up in 2 weeks.  Call sooner if concerns arise.  He is not sure when he will be going back home and needing Meals on Wheels.      Other Visit Diagnoses       Urinary tract infection with hematuria, site unspecified       Should be completing antibiotic today. Will repeat his UA to ensure resolution.   Relevant Orders   Urinalysis, Routine w reflex microscopic   Urine Culture         Follow up plan: Return in about 2 weeks (around 08/28/2024) for Weight check .

## 2024-08-14 NOTE — Assessment & Plan Note (Addendum)
 Lost 2 lbs since last week. Reports eating more than he was now that he is living with a friend.  Does plan to go back home but not sure when.  Declines trying boost or ensure prior between meals.  Encouraged to eat a high protein meal. Follow up in 2 weeks.  Call sooner if concerns arise.  He is not sure when he will be going back home and needing Meals on Wheels.

## 2024-08-15 ENCOUNTER — Ambulatory Visit: Payer: Self-pay | Admitting: Nurse Practitioner

## 2024-08-16 ENCOUNTER — Other Ambulatory Visit: Payer: Self-pay | Admitting: *Deleted

## 2024-08-16 NOTE — Patient Outreach (Addendum)
 Follow up phone call to patient to assess for CCM needs. Patient states that he continues to live with a family friend who will assist with meals, medications and transportation to medical appointments.  Patient declined need for CCM services at this time.    Lurae Hornbrook, LCSW [place]  [facility], [facility] Licensed Clinical Social Worker  Direct Dial: [phone]

## 2024-08-18 LAB — URINE CULTURE

## 2024-08-18 MED ORDER — CIPROFLOXACIN HCL 500 MG PO TABS: 500.0000 mg | ORAL_TABLET | Freq: Two times a day (BID) | ORAL | 0 refills | Status: AC

## 2024-08-28 ENCOUNTER — Encounter: Payer: Self-pay | Admitting: Nurse Practitioner

## 2024-08-28 ENCOUNTER — Ambulatory Visit (INDEPENDENT_AMBULATORY_CARE_PROVIDER_SITE_OTHER): Admitting: Nurse Practitioner

## 2024-08-28 VITALS — BP 108/64 | HR 94 | Temp 98.4°F | Ht 64.0 in | Wt 129.0 lb

## 2024-08-28 DIAGNOSIS — E559 Vitamin D deficiency, unspecified: Secondary | ICD-10-CM

## 2024-08-28 DIAGNOSIS — R531 Weakness: Secondary | ICD-10-CM | POA: Diagnosis not present

## 2024-08-28 DIAGNOSIS — R5383 Other fatigue: Secondary | ICD-10-CM

## 2024-08-28 NOTE — Addendum Note (Signed)
 Addended by: [name] on: 08/28/2024 04:09 PM   Modules accepted: Orders

## 2024-08-28 NOTE — Progress Notes (Addendum)
 BP 108/64   Pulse 94   Temp 98.4 F (36.9 C) (Oral)   Ht 5' 4 (1.626 m)   Wt 129 lb (58.5 kg)   SpO2 97%   BMI 22.14 kg/m    Subjective:    Patient ID: Ralph Leon, male    DOB: 1936/01/07, 88 y.o.   MRN: [id number]  HPI: Ralph Leon is a 88 y.o. male  Chief Complaint  Patient presents with   Weight Check   Patient presents to clinic with complaints of fatigue and if he tries to walk without a walker he feels like he is going to fall.  When he goes to bed at night he feels really hot.  States he feels like he has a fever but it goes way on his own. He states he is eating 3 meals a day and his appetite is good.  He is still living with his friend who helps him a lot.  He does have SOB but this has been an ongoing issue.    Relevant past medical, surgical, family and social history reviewed and updated as indicated. Interim medical history since our last visit reviewed. Allergies and medications reviewed and updated.  Review of Systems  Constitutional:  Positive for fatigue. Negative for appetite change.  Respiratory:  Positive for shortness of breath.   Neurological:  Positive for weakness.    Per HPI unless specifically indicated above     Objective:    BP 108/64   Pulse 94   Temp 98.4 F (36.9 C) (Oral)   Ht 5' 4 (1.626 m)   Wt 129 lb (58.5 kg)   SpO2 97%   BMI 22.14 kg/m   Wt Readings from Last 3 Encounters:  08/28/24 129 lb (58.5 kg)  08/14/24 128 lb 3.2 oz (58.2 kg)  08/07/24 130 lb (59 kg)    Physical Exam Vitals and nursing note reviewed.  Constitutional:      General: He is awake. He is not in acute distress.    Appearance: He is well-developed, well-groomed and underweight. He is not ill-appearing or toxic-appearing.  HENT:     Head: Normocephalic.     Right Ear: Hearing and external ear normal.     Left Ear: Hearing and external ear normal.  Eyes:     General: Lids are normal.     Extraocular Movements: Extraocular movements intact.      Conjunctiva/sclera: Conjunctivae normal.  Neck:     Thyroid: No thyromegaly.     Vascular: No carotid bruit.  Cardiovascular:     Rate and Rhythm: Normal rate and regular rhythm.     Heart sounds: Normal heart sounds. No murmur heard.    No gallop.  Pulmonary:     Effort: No accessory muscle usage or respiratory distress.     Breath sounds: No decreased breath sounds, wheezing or rales.     Comments: Expiratory wheezes noted throughout. No SOB with talking. Abdominal:     General: Bowel sounds are normal. There is no distension.     Palpations: Abdomen is soft.     Tenderness: There is no abdominal tenderness.  Musculoskeletal:     Cervical back: Full passive range of motion without pain.     Right lower leg: No edema.     Left lower leg: No edema.  Lymphadenopathy:     Cervical: No cervical adenopathy.  Skin:    General: Skin is warm.     Capillary Refill: Capillary refill takes less than 2  seconds.  Neurological:     Mental Status: He is alert and oriented to person, place, and time.     Deep Tendon Reflexes: Reflexes are normal and symmetric.     Reflex Scores:      Brachioradialis reflexes are 2+ on the right side and 2+ on the left side.      Patellar reflexes are 2+ on the right side and 2+ on the left side.    Comments: Oriented to person, place, and time.    Psychiatric:        Attention and Perception: Attention normal.        Mood and Affect: Mood normal.        Speech: Speech normal.        Behavior: Behavior normal. Behavior is cooperative.        Thought Content: Thought content normal.     Results for orders placed or performed in visit on 08/14/24  Urine Culture   Collection Time: 08/14/24  1:54 PM   Specimen: Urine   UR  Result Value Ref Range   Urine Culture, Routine Final report (A)    Organism ID, Bacteria Comment (A)    Antimicrobial Susceptibility Comment   Microscopic Examination   Collection Time: 08/14/24  1:54 PM   Urine  Result Value  Ref Range   WBC, UA 0-5 0 - 5 /hpf   RBC, Urine None seen 0 - 2 /hpf   Epithelial Cells (non renal) 0-10 0 - 10 /hpf   Bacteria, UA Few (A) None seen/Few  Urinalysis, Routine w reflex microscopic   Collection Time: 08/14/24  1:54 PM  Result Value Ref Range   Specific Gravity, UA 1.015 1.005 - 1.030   pH, UA 6.5 5.0 - 7.5   Color, UA Yellow Yellow   Appearance Ur Clear Clear   Leukocytes,UA Trace (A) Negative   Protein,UA 1+ (A) Negative/Trace   Glucose, UA Negative Negative   Ketones, UA Negative Negative   RBC, UA Negative Negative   Bilirubin, UA Negative Negative   Urobilinogen, Ur 1.0 0.2 - 1.0 mg/dL   Nitrite, UA Negative Negative   Microscopic Examination See below:       Assessment & Plan:   Problem List Items Addressed This Visit       Other   Other fatigue - Primary   Chronic. Has been ongoing concern for several years.  Encouraged patient to increase protein intake by using Boost or Ensure 2-3 times per day to help with caloric intake.  Labs ordered to rule out anemia or thyroid concerns.      Relevant Orders   Vitamin D  (25 hydroxy)   TSH   T4, free   B12   Vitamin D  deficiency   Labs ordered at visit today.  Will make recommendations based on lab results.        Relevant Orders   Vitamin D  (25 hydroxy)   CBC w/Diff   B12   Other Visit Diagnoses       Weakness       Ongoing issue. Patient declines physical therapy. Agrees to [employer]. Encouraged increased protein intake.  Has had recent falls would benefit from rolator.   Relevant Orders   Vitamin D  (25 hydroxy)   TSH   T4, free   B12          Follow up plan: Return in about 3 months (around 11/28/2024) for HTN, HLD, DM2 FU.

## 2024-08-28 NOTE — Assessment & Plan Note (Signed)
 Labs ordered at visit today.  Will make recommendations based on lab results.

## 2024-08-28 NOTE — Addendum Note (Signed)
 Addended by: [name] on: 08/28/2024 04:25 PM   Modules accepted: Orders

## 2024-08-28 NOTE — Assessment & Plan Note (Signed)
 Chronic. Has been ongoing concern for several years.  Encouraged patient to increase protein intake by using Boost or Ensure 2-3 times per day to help with caloric intake.  Labs ordered to rule out anemia or thyroid concerns.

## 2024-08-29 ENCOUNTER — Ambulatory Visit: Payer: Self-pay | Admitting: Nurse Practitioner

## 2024-08-29 ENCOUNTER — Other Ambulatory Visit

## 2024-08-29 DIAGNOSIS — D649 Anemia, unspecified: Secondary | ICD-10-CM

## 2024-08-29 LAB — CBC WITH DIFFERENTIAL/PLATELET
Basophils Absolute: 0 x10E3/uL (ref 0.0–0.2)
Basos: 0 %
EOS (ABSOLUTE): 0.2 x10E3/uL (ref 0.0–0.4)
Eos: 2 %
Hematocrit: 27.4 % — ABNORMAL LOW (ref 37.5–51.0)
Hemoglobin: 8.1 g/dL — ABNORMAL LOW (ref 13.0–17.7)
Immature Grans (Abs): 0 x10E3/uL (ref 0.0–0.1)
Immature Granulocytes: 0 %
Lymphocytes Absolute: 0.6 x10E3/uL — ABNORMAL LOW (ref 0.7–3.1)
Lymphs: 8 %
MCH: 25.8 pg — ABNORMAL LOW (ref 26.6–33.0)
MCHC: 29.6 g/dL — ABNORMAL LOW (ref 31.5–35.7)
MCV: 87 fL (ref 79–97)
Monocytes Absolute: 0.5 x10E3/uL (ref 0.1–0.9)
Monocytes: 7 %
Neutrophils Absolute: 6.2 x10E3/uL (ref 1.4–7.0)
Neutrophils: 83 %
Platelets: 197 x10E3/uL (ref 150–450)
RBC: 3.14 x10E6/uL — ABNORMAL LOW (ref 4.14–5.80)
RDW: 15.1 % (ref 11.6–15.4)
WBC: 7.6 x10E3/uL (ref 3.4–10.8)

## 2024-08-29 LAB — TSH: TSH: 1.73 u[IU]/mL (ref 0.450–4.500)

## 2024-08-29 LAB — T4, FREE: Free T4: 1.31 ng/dL (ref 0.82–1.77)

## 2024-08-29 LAB — VITAMIN D 25 HYDROXY (VIT D DEFICIENCY, FRACTURES): Vit D, 25-Hydroxy: 38.6 ng/mL (ref 30.0–100.0)

## 2024-08-29 LAB — VITAMIN B12: Vitamin B-12: 339 pg/mL (ref 232–1245)

## 2024-08-30 ENCOUNTER — Ambulatory Visit: Payer: Self-pay | Admitting: Family Medicine

## 2024-08-30 DIAGNOSIS — D508 Other iron deficiency anemias: Secondary | ICD-10-CM

## 2024-08-30 DIAGNOSIS — R7989 Other specified abnormal findings of blood chemistry: Secondary | ICD-10-CM

## 2024-08-30 DIAGNOSIS — R195 Other fecal abnormalities: Secondary | ICD-10-CM

## 2024-08-30 LAB — FERRITIN: Ferritin: 92 ng/mL (ref 30–400)

## 2024-08-30 LAB — FOLATE: Folate: 9.4 ng/mL

## 2024-08-30 LAB — IRON AND TIBC
Iron Saturation: 3 % — CL (ref 15–55)
Iron: 9 ug/dL — CL (ref 38–169)
Total Iron Binding Capacity: 282 ug/dL (ref 250–450)
UIBC: 273 ug/dL (ref 111–343)

## 2024-09-02 ENCOUNTER — Other Ambulatory Visit: Payer: Self-pay | Admitting: Nurse Practitioner

## 2024-09-03 LAB — FECAL OCCULT BLOOD, IMMUNOCHEMICAL: Fecal Occult Bld: POSITIVE — AB

## 2024-09-05 ENCOUNTER — Other Ambulatory Visit

## 2024-09-05 DIAGNOSIS — I10 Essential (primary) hypertension: Secondary | ICD-10-CM

## 2024-09-05 NOTE — Telephone Encounter (Signed)
 Requested Prescriptions  Pending Prescriptions Disp Refills   albuterol  (VENTOLIN  HFA) 108 (90 Base) MCG/ACT inhaler [Pharmacy Med Name: Albuterol  Sulfate HFA 108 (90 Base) MCG/ACT Inhalation Aerosol Solution] 18 g 2    Sig: INHALE 2 PUFFS BY MOUTH EVERY 6 HOURS AS NEEDED FOR WHEEZING FOR SHORTNESS OF BREATH     Pulmonology:  Beta Agonists 2 Passed - 09/05/2024 12:35 PM      Passed - Last BP in normal range    BP Readings from Last 1 Encounters:  08/28/24 108/64         Passed - Last Heart Rate in normal range    Pulse Readings from Last 1 Encounters:  08/28/24 94         Passed - Valid encounter within last 12 months    Recent Outpatient Visits           1 week ago Other fatigue   [place] [facility] [place], Darice, NP   3 weeks ago Chronic obstructive pulmonary disease, unspecified COPD type [facility])   [facility] Melvin Darice, NP   4 weeks ago Chronic obstructive pulmonary disease, unspecified COPD type (HCC)   [place] [facility] [place], [place] T, NP   4 months ago Chronic obstructive pulmonary disease, unspecified COPD type (HCC)   [place] [facility] Melvin Darice, NP   4 months ago Encounter for [employer] annual wellness exam   [place] [facility] Melvin Darice, NP

## 2024-09-06 ENCOUNTER — Telehealth: Payer: Self-pay

## 2024-09-06 ENCOUNTER — Encounter: Payer: Self-pay | Admitting: Oncology

## 2024-09-06 ENCOUNTER — Other Ambulatory Visit: Payer: Self-pay

## 2024-09-06 ENCOUNTER — Ambulatory Visit: Payer: Self-pay | Admitting: Oncology

## 2024-09-06 ENCOUNTER — Other Ambulatory Visit: Payer: Self-pay | Admitting: Oncology

## 2024-09-06 ENCOUNTER — Inpatient Hospital Stay

## 2024-09-06 ENCOUNTER — Inpatient Hospital Stay: Attending: Oncology | Admitting: Oncology

## 2024-09-06 ENCOUNTER — Ambulatory Visit: Payer: Self-pay | Admitting: Nurse Practitioner

## 2024-09-06 VITALS — BP 118/88 | HR 87 | Temp 97.8°F | Resp 16 | Ht 64.0 in | Wt 130.0 lb

## 2024-09-06 DIAGNOSIS — D500 Iron deficiency anemia secondary to blood loss (chronic): Secondary | ICD-10-CM

## 2024-09-06 DIAGNOSIS — Z7982 Long term (current) use of aspirin: Secondary | ICD-10-CM | POA: Insufficient documentation

## 2024-09-06 DIAGNOSIS — Z833 Family history of diabetes mellitus: Secondary | ICD-10-CM | POA: Diagnosis not present

## 2024-09-06 DIAGNOSIS — I10 Essential (primary) hypertension: Secondary | ICD-10-CM | POA: Diagnosis not present

## 2024-09-06 DIAGNOSIS — Z87891 Personal history of nicotine dependence: Secondary | ICD-10-CM | POA: Insufficient documentation

## 2024-09-06 DIAGNOSIS — R195 Other fecal abnormalities: Secondary | ICD-10-CM | POA: Diagnosis not present

## 2024-09-06 DIAGNOSIS — D508 Other iron deficiency anemias: Secondary | ICD-10-CM | POA: Diagnosis not present

## 2024-09-06 DIAGNOSIS — Z8673 Personal history of transient ischemic attack (TIA), and cerebral infarction without residual deficits: Secondary | ICD-10-CM | POA: Insufficient documentation

## 2024-09-06 DIAGNOSIS — R5383 Other fatigue: Secondary | ICD-10-CM

## 2024-09-06 DIAGNOSIS — D649 Anemia, unspecified: Secondary | ICD-10-CM | POA: Insufficient documentation

## 2024-09-06 DIAGNOSIS — Z8249 Family history of ischemic heart disease and other diseases of the circulatory system: Secondary | ICD-10-CM | POA: Diagnosis not present

## 2024-09-06 DIAGNOSIS — Z604 Social exclusion and rejection: Secondary | ICD-10-CM | POA: Diagnosis not present

## 2024-09-06 DIAGNOSIS — R635 Abnormal weight gain: Secondary | ICD-10-CM | POA: Diagnosis not present

## 2024-09-06 DIAGNOSIS — Z79899 Other long term (current) drug therapy: Secondary | ICD-10-CM | POA: Insufficient documentation

## 2024-09-06 DIAGNOSIS — K5900 Constipation, unspecified: Secondary | ICD-10-CM | POA: Diagnosis not present

## 2024-09-06 DIAGNOSIS — J449 Chronic obstructive pulmonary disease, unspecified: Secondary | ICD-10-CM | POA: Insufficient documentation

## 2024-09-06 DIAGNOSIS — D509 Iron deficiency anemia, unspecified: Secondary | ICD-10-CM | POA: Insufficient documentation

## 2024-09-06 LAB — COMPREHENSIVE METABOLIC PANEL WITH GFR
ALT: 19 IU/L (ref 0–44)
AST: 23 IU/L (ref 0–40)
Albumin: 3.6 g/dL — ABNORMAL LOW (ref 3.7–4.7)
Alkaline Phosphatase: 75 IU/L (ref 48–129)
BUN/Creatinine Ratio: 20 (ref 10–24)
BUN: 21 mg/dL (ref 8–27)
Bilirubin Total: 0.4 mg/dL (ref 0.0–1.2)
CO2: 25 mmol/L (ref 20–29)
Calcium: 9.1 mg/dL (ref 8.6–10.2)
Chloride: 102 mmol/L (ref 96–106)
Creatinine, Ser: 1.07 mg/dL (ref 0.76–1.27)
Globulin, Total: 2.3 g/dL (ref 1.5–4.5)
Glucose: 112 mg/dL — ABNORMAL HIGH (ref 70–99)
Potassium: 4.4 mmol/L (ref 3.5–5.2)
Sodium: 144 mmol/L (ref 134–144)
Total Protein: 5.9 g/dL — ABNORMAL LOW (ref 6.0–8.5)
eGFR: 68 mL/min/1.73 (ref >59)

## 2024-09-06 LAB — CBC WITH DIFFERENTIAL/PLATELET
Abs Immature Granulocytes: 0.09 K/uL — ABNORMAL HIGH (ref 0.00–0.07)
Basophils Absolute: 0 K/uL (ref 0.0–0.1)
Basophils Relative: 0 %
Eosinophils Absolute: 0.3 K/uL (ref 0.0–0.5)
Eosinophils Relative: 3 %
HCT: 22.6 % — ABNORMAL LOW (ref 39.0–52.0)
Hemoglobin: 6.8 g/dL — CL (ref 13.0–17.0)
Immature Granulocytes: 1 %
Lymphocytes Relative: 20 %
Lymphs Abs: 1.5 K/uL (ref 0.7–4.0)
MCH: 25.6 pg — ABNORMAL LOW (ref 26.0–34.0)
MCHC: 30.1 g/dL (ref 30.0–36.0)
MCV: 85 fL (ref 80.0–100.0)
Monocytes Absolute: 0.5 K/uL (ref 0.1–1.0)
Monocytes Relative: 6 %
Neutro Abs: 5.3 K/uL (ref 1.7–7.7)
Neutrophils Relative %: 70 %
Platelets: 435 K/uL — ABNORMAL HIGH (ref 150–400)
RBC: 2.66 MIL/uL — ABNORMAL LOW (ref 4.22–5.81)
RDW: 17 % — ABNORMAL HIGH (ref 11.5–15.5)
WBC: 7.6 K/uL (ref 4.0–10.5)
nRBC: 0 % (ref 0.0–0.2)

## 2024-09-06 LAB — RETIC PANEL
Immature Retic Fract: 19.7 % — ABNORMAL HIGH (ref 2.3–15.9)
RBC.: 2.44 MIL/uL — ABNORMAL LOW (ref 4.22–5.81)
Retic Count, Absolute: 88 K/uL (ref 19.0–186.0)
Retic Ct Pct: 3.6 % — ABNORMAL HIGH (ref 0.4–3.1)
Reticulocyte Hemoglobin: 18.5 pg — ABNORMAL LOW (ref >27.9)

## 2024-09-06 LAB — LACTATE DEHYDROGENASE: LDH: 126 U/L (ref 98–192)

## 2024-09-06 LAB — IRON AND TIBC
Iron: 15 ug/dL — ABNORMAL LOW (ref 45–182)
Saturation Ratios: 4 % — ABNORMAL LOW (ref 17.9–39.5)
TIBC: 361 ug/dL (ref 250–450)
UIBC: 346 ug/dL

## 2024-09-06 LAB — TECHNOLOGIST SMEAR REVIEW: Plt Morphology: INCREASED

## 2024-09-06 LAB — ABO/RH: ABO/RH(D): O POS

## 2024-09-06 LAB — PREPARE RBC (CROSSMATCH)

## 2024-09-06 LAB — FERRITIN: Ferritin: 17 ng/mL — ABNORMAL LOW (ref 24–336)

## 2024-09-06 NOTE — Assessment & Plan Note (Addendum)
 Labs are reviewed and discussed with patient. Lab Results  Component Value Date   HGB 6.8 (LL) 09/06/2024   TIBC 361 09/06/2024   IRONPCTSAT 4 (L) 09/06/2024   FERRITIN 17 (L) 09/06/2024     Patient has constipation and may not tolerate oral iron supplementation. Hemoglobin is less than 7.  Recommend 1 unit of blood transfusion. After that, I recommend IV Venofer treatments. I discussed about the potential risks including but not limited to allergic reactions/infusion reactions including anaphylactic reactions, diarrhea, phlebitis, high blood pressure, wheezing, SOB, skin rash, weight gain,dark urine, leg swelling, back pain, headache, nausea and fatigue, etc. .  Patient agrees with the plan.  Plan IV venofer weekly x 4   Suspect GI blood loss.  Recommend GI workup

## 2024-09-06 NOTE — Progress Notes (Signed)
 Patient has been having constipation, and he is feeling fatigued.

## 2024-09-06 NOTE — Addendum Note (Signed)
 Addended by: BABARA CALL on: 09/06/2024 09:43 PM   Modules accepted: Orders

## 2024-09-06 NOTE — Assessment & Plan Note (Signed)
 Patient has occult blood positive in stool.  He also is constipated.   Iron deficiency anemia suspect due to GI blood loss.  Recommend GI evaluation.  Patient is reluctant.

## 2024-09-06 NOTE — Progress Notes (Signed)
 Hematology/Oncology Consult note [phone] [phone]        REFERRING PROVIDER: Vicci Duwaine SQUIBB, DO   CHIEF COMPLAINTS/REASON FOR VISIT:  Evaluation of iron deficiency anemia   ASSESSMENT & PLAN:   Iron deficiency anemia Labs are reviewed and discussed with patient. Lab Results  Component Value Date   HGB 6.8 (LL) 09/06/2024   TIBC 361 09/06/2024   IRONPCTSAT 4 (L) 09/06/2024   FERRITIN 17 (L) 09/06/2024     Patient has constipation and may not tolerate oral iron supplementation. Hemoglobin is less than 7.  Recommend 1 unit of blood transfusion. After that, I recommend IV Venofer treatments. I discussed about the potential risks including but not limited to allergic reactions/infusion reactions including anaphylactic reactions, diarrhea, phlebitis, high blood pressure, wheezing, SOB, skin rash, weight gain,dark urine, leg swelling, back pain, headache, nausea and fatigue, etc. .  Patient agrees with the plan.  Plan IV venofer weekly x 4   Suspect GI blood loss.  Recommend GI workup  Occult blood in stools Patient has occult blood positive in stool.  He also is constipated.   Iron deficiency anemia suspect due to GI blood loss.  Recommend GI evaluation.  Patient is reluctant.   Orders Placed This Encounter  Procedures   Ferritin    Standing Status:   Future    Number of Occurrences:   1    Expected Date:   09/06/2024    Expiration Date:   12/05/2024   Iron and TIBC    Standing Status:   Future    Number of Occurrences:   1    Expected Date:   09/06/2024    Expiration Date:   12/05/2024   CBC with Differential/Platelet    Standing Status:   Future    Number of Occurrences:   1    Expected Date:   09/06/2024    Expiration Date:   12/05/2024   Retic Panel    Standing Status:   Future    Number of Occurrences:   1    Expected Date:   09/06/2024    Expiration Date:   12/05/2024   Lactate dehydrogenase    Standing Status:   Future    Number  of Occurrences:   1    Expected Date:   09/06/2024    Expiration Date:   12/05/2024   Technologist smear review    Standing Status:   Future    Number of Occurrences:   1    Expected Date:   09/06/2024    Expiration Date:   09/06/2025    Clinical information::   anemia   Follow-up in 2 months. All questions were answered. The patient knows to call the clinic with any problems, questions or concerns.  [name], MD, PhD [facility] Hematology Oncology 09/06/2024   HISTORY OF PRESENTING ILLNESS:   Ralph Leon is a  88 y.o.  male with PMH listed below was seen in consultation at the request of  Vicci, Megan P, DO  for evaluation of iron deficiency anemia  Discussed the use of AI scribe software for clinical note transcription with the patient, who gave verbal consent to proceed.  He recently had a blood test on October 7th showing very low iron saturation at 3% and a decreased hemoglobin level of 8.1. Ferritin level was within normal limits at 92.   He has been taking low-dose aspirin  for approximately three years following a stroke. He feels tired and experiences constipation, which he  describes as 'sort of comes and goes' and 'probably a little more than usual.' He has not tried any iron supplements yet.  No recent weight loss, but he notes a slight weight gain, stating he has 'gained a pound or so.' He reports having a good appetite. He has undergone a colonoscopy once in the past but is reluctant to have another one.    MEDICAL HISTORY:  Past Medical History:  Diagnosis Date   COPD (chronic obstructive pulmonary disease) (HCC)    Hypertension    Stroke (HCC)     SURGICAL HISTORY: History reviewed. No pertinent surgical history.  SOCIAL HISTORY: Social History   Socioeconomic History   Marital status: Single    Spouse name: Not on file   Number of children: Not on file   Years of education: Not on file   Highest education level: Not on file  Occupational History    Occupation: retired  Tobacco Use   Smoking status: Former    Current packs/day: 0.25    Average packs/day: 0.3 packs/day for 64.0 years (16.0 ttl pk-yrs)    Types: Cigarettes   Smokeless tobacco: Never   Tobacco comments:    5-6 a day. 03/06/2021  Vaping Use   Vaping status: Never Used  Substance and Sexual Activity   Alcohol use: Not Currently   Drug use: Not Currently   Sexual activity: Not Currently  Other Topics Concern   Not on file  Social History Narrative   Not on file   Social Drivers of Health   Financial Resource Strain: Low Risk  (04/19/2024)   Overall Financial Resource Strain (CARDIA)    Difficulty of Paying Living Expenses: Not hard at all  Food Insecurity: No Food Insecurity (04/19/2024)   Hunger Vital Sign    Worried About Running Out of Food in the Last Year: Never true    Ran Out of Food in the Last Year: Never true  Transportation Needs: No Transportation Needs (04/19/2024)   PRAPARE - Administrator, Civil Service (Medical): No    Lack of Transportation (Non-Medical): No  Physical Activity: Insufficiently Active (04/19/2024)   Exercise Vital Sign    Days of Exercise per Week: 2 days    Minutes of Exercise per Session: 10 min  Stress: No Stress Concern Present (04/19/2024)   [employer] of Occupational Health - Occupational Stress Questionnaire    Feeling of Stress : Not at all  Social Connections: Socially Isolated (04/19/2024)   Social Connection and Isolation Panel    Frequency of Communication with Friends and Family: More than three times a week    Frequency of Social Gatherings with Friends and Family: Once a week    Attends Religious Services: Never    Database administrator or Organizations: No    Attends Banker Meetings: Never    Marital Status: Never married  Intimate Partner Violence: Not At Risk (04/19/2024)   Humiliation, Afraid, Rape, and Kick questionnaire    Fear of Current or Ex-Partner: No    Emotionally  Abused: No    Physically Abused: No    Sexually Abused: No    FAMILY HISTORY: Family History  Problem Relation Age of Onset   Diabetes Mother    Heart attack Father     ALLERGIES:  has no known allergies.  MEDICATIONS:  Current Outpatient Medications  Medication Sig Dispense Refill   albuterol  (VENTOLIN  HFA) 108 (90 Base) MCG/ACT inhaler INHALE 2 PUFFS BY MOUTH EVERY 6 HOURS  AS NEEDED FOR WHEEZING FOR SHORTNESS OF BREATH 18 g 2   aspirin  EC 81 MG tablet Take 1 tablet (81 mg total) by mouth daily. Swallow whole. 90 tablet 3   atorvastatin  (LIPITOR) 40 MG tablet Take 1 tablet by mouth once daily 90 tablet 1   donepezil (ARICEPT) 5 MG tablet Take 5 mg by mouth at bedtime.     hydrochlorothiazide  (HYDRODIURIL ) 12.5 MG tablet Take 1 tablet by mouth once daily 90 tablet 1   lisinopril  (ZESTRIL ) 20 MG tablet Take 1 tablet by mouth once daily 90 tablet 1   No current facility-administered medications for this visit.    Review of Systems  Constitutional:  Positive for fatigue. Negative for appetite change, chills, fever and unexpected weight change.  HENT:   Negative for hearing loss and voice change.   Eyes:  Negative for eye problems and icterus.  Respiratory:  Negative for chest tightness, cough and shortness of breath.   Cardiovascular:  Negative for chest pain and leg swelling.  Gastrointestinal:  Positive for constipation. Negative for abdominal distention, abdominal pain, blood in stool, nausea and vomiting.  Endocrine: Negative for hot flashes.  Genitourinary:  Negative for difficulty urinating, dysuria and frequency.   Musculoskeletal:  Negative for arthralgias.  Skin:  Negative for itching and rash.  Neurological:  Negative for light-headedness and numbness.  Hematological:  Negative for adenopathy. Does not bruise/bleed easily.  Psychiatric/Behavioral:  Negative for confusion.    PHYSICAL EXAMINATION:  Vitals:   09/06/24 1424  BP: 118/88  Pulse: 87  Resp: 16  Temp:  97.8 F (36.6 C)  SpO2: 99%   Filed Weights   09/06/24 1424  Weight: 130 lb (59 kg)    Physical Exam Constitutional:      General: He is not in acute distress. HENT:     Head: Normocephalic and atraumatic.  Eyes:     General: No scleral icterus. Cardiovascular:     Rate and Rhythm: Normal rate.     Heart sounds: Normal heart sounds.  Pulmonary:     Effort: Pulmonary effort is normal. No respiratory distress.     Breath sounds: No wheezing.  Abdominal:     General: Bowel sounds are normal. There is no distension.     Palpations: Abdomen is soft.  Musculoskeletal:        General: No deformity. Normal range of motion.     Cervical back: Normal range of motion and neck supple.  Skin:    General: Skin is warm and dry.     Coloration: Skin is pale.     Findings: No erythema or rash.  Neurological:     Mental Status: He is alert and oriented to person, place, and time. Mental status is at baseline.  Psychiatric:        Mood and Affect: Mood normal.     LABORATORY DATA:  I have reviewed the data as listed    Latest Ref Rng & Units 09/06/2024    3:04 PM 08/28/2024    4:35 PM 07/15/2023    3:15 PM  CBC  WBC 4.0 - 10.5 K/uL 7.6  7.6  8.1   Hemoglobin 13.0 - 17.0 g/dL 6.8  8.1  86.5   Hematocrit 39.0 - 52.0 % 22.6  27.4  41.8   Platelets 150 - 400 K/uL 435  197  232       Latest Ref Rng & Units 09/05/2024    2:48 PM 04/19/2024    2:42 PM 01/27/2024  3:40 PM  CMP  Glucose 70 - 99 mg/dL 887  88  86   BUN 8 - 27 mg/dL 21  23  22    Creatinine 0.76 - 1.27 mg/dL 8.92  8.64  8.84   Sodium 134 - 144 mmol/L 144  144  141   Potassium 3.5 - 5.2 mmol/L 4.4  4.5  4.4   Chloride 96 - 106 mmol/L 102  103  101   CO2 20 - 29 mmol/L 25  25  24    Calcium  8.6 - 10.2 mg/dL 9.1  9.6  9.8   Total Protein 6.0 - 8.5 g/dL 5.9  6.7  6.8   Total Bilirubin 0.0 - 1.2 mg/dL 0.4  0.6  0.6   Alkaline Phos 48 - 129 IU/L 75  85  88   AST 0 - 40 IU/L 23  14  14    ALT 0 - 44 IU/L 19  8  9         RADIOGRAPHIC STUDIES: I have personally reviewed the radiological images as listed and agreed with the findings in the report. No results found.

## 2024-09-06 NOTE — Telephone Encounter (Signed)
 Received critica lab from [place] at [place] lab. Hemoglobin 6.8. MD notified and recommends Blood transfusion.   Called pt and asked if he can come back to get a hold tube so he can get blood transfusion tomorrow. Pt states he cannot come back today. Since he can't come back for lab, Dr. Babara recommends fo pt to go to ER for blood transfusion. Pt verbalized understanding.

## 2024-09-07 ENCOUNTER — Encounter: Payer: Self-pay | Admitting: Oncology

## 2024-09-07 ENCOUNTER — Inpatient Hospital Stay

## 2024-09-07 NOTE — Telephone Encounter (Signed)
-----   Message from [name] sent at 09/06/2024  9:41 PM EDT ----- Patient has iron deficient anemia.  Please arrange patient to get IV Venofer weekly x 4. Follow-up in 2 months, lab prior to MD +/- Venofer.  Labs are ordered.  Thank you ----- Message ----- From: Rebecka, Lab In [place] Sent: 09/06/2024   3:22 PM EDT To: [name], MD

## 2024-09-07 NOTE — Telephone Encounter (Signed)
 Rosina will you please arrange patient to get IV Venofer weekly x 4 and follow-up in 2 months, lab prior to MD +/- Venofer.

## 2024-09-07 NOTE — Progress Notes (Signed)
 Patient informed of plan for IV venofer weekly and 2 month follow-up. He will pick up appt at tomorrow's appt

## 2024-09-08 ENCOUNTER — Inpatient Hospital Stay

## 2024-09-08 DIAGNOSIS — D509 Iron deficiency anemia, unspecified: Secondary | ICD-10-CM | POA: Diagnosis not present

## 2024-09-08 DIAGNOSIS — D649 Anemia, unspecified: Secondary | ICD-10-CM

## 2024-09-08 MED ORDER — ACETAMINOPHEN 325 MG PO TABS
650.0000 mg | ORAL_TABLET | Freq: Once | ORAL | Status: AC
  Administered 2024-09-08: 650 mg via ORAL
  Filled 2024-09-08: qty 2

## 2024-09-08 MED ORDER — DIPHENHYDRAMINE HCL 25 MG PO CAPS
25.0000 mg | ORAL_CAPSULE | Freq: Once | ORAL | Status: AC
  Administered 2024-09-08: 25 mg via ORAL
  Filled 2024-09-08: qty 1

## 2024-09-08 MED ORDER — SODIUM CHLORIDE 0.9% IV SOLUTION
250.0000 mL | INTRAVENOUS | Status: DC
  Administered 2024-09-08: 100 mL via INTRAVENOUS
  Filled 2024-09-08: qty 250

## 2024-09-08 NOTE — Patient Instructions (Signed)

## 2024-09-09 LAB — TYPE AND SCREEN
ABO/RH(D): O POS
Antibody Screen: NEGATIVE
Unit division: 0

## 2024-09-09 LAB — BPAM RBC
Blood Product Expiration Date: 202511172359
ISSUE DATE / TIME: 202510171325
Unit Type and Rh: 5100

## 2024-09-11 ENCOUNTER — Inpatient Hospital Stay

## 2024-09-11 ENCOUNTER — Telehealth: Payer: Self-pay | Admitting: *Deleted

## 2024-09-11 VITALS — BP 127/63 | HR 78 | Temp 96.2°F | Resp 18

## 2024-09-11 DIAGNOSIS — D500 Iron deficiency anemia secondary to blood loss (chronic): Secondary | ICD-10-CM

## 2024-09-11 DIAGNOSIS — D509 Iron deficiency anemia, unspecified: Secondary | ICD-10-CM | POA: Diagnosis not present

## 2024-09-11 MED ORDER — IRON SUCROSE 20 MG/ML IV SOLN
200.0000 mg | Freq: Once | INTRAVENOUS | Status: AC
  Administered 2024-09-11: 200 mg via INTRAVENOUS
  Filled 2024-09-11: qty 10

## 2024-09-11 NOTE — Telephone Encounter (Signed)
 Patient has a another appointment today at [facility] clinic and that is why he wanted to see how long it is going to be for him to get his IV iron.  I told him it was 1 hour and he says that he has 1 at [facility] clinic he thinks it for 3 pm  so he feels like that he will able to get to the other appointment

## 2024-09-12 ENCOUNTER — Ambulatory Visit
Admission: RE | Admit: 2024-09-12 | Discharge: 2024-09-12 | Disposition: A | Discharge status: Home / Self Care | Source: Ambulatory Visit | Attending: Nurse Practitioner | Admitting: Nurse Practitioner

## 2024-09-12 DIAGNOSIS — R195 Other fecal abnormalities: Secondary | ICD-10-CM | POA: Diagnosis present

## 2024-09-12 DIAGNOSIS — R7989 Other specified abnormal findings of blood chemistry: Secondary | ICD-10-CM | POA: Insufficient documentation

## 2024-09-12 DIAGNOSIS — D508 Other iron deficiency anemias: Secondary | ICD-10-CM | POA: Insufficient documentation

## 2024-09-12 MED ORDER — IOHEXOL 300 MG/ML  SOLN
100.0000 mL | Freq: Once | INTRAMUSCULAR | Status: AC | PRN
  Administered 2024-09-12: 100 mL via INTRAVENOUS

## 2024-09-15 ENCOUNTER — Ambulatory Visit: Payer: Self-pay | Admitting: Nurse Practitioner

## 2024-09-18 ENCOUNTER — Inpatient Hospital Stay

## 2024-09-18 VITALS — BP 111/56 | HR 72 | Temp 95.1°F | Resp 16

## 2024-09-18 DIAGNOSIS — D500 Iron deficiency anemia secondary to blood loss (chronic): Secondary | ICD-10-CM

## 2024-09-18 DIAGNOSIS — D509 Iron deficiency anemia, unspecified: Secondary | ICD-10-CM | POA: Diagnosis not present

## 2024-09-18 MED ORDER — SODIUM CHLORIDE 0.9% FLUSH
10.0000 mL | Freq: Once | INTRAVENOUS | Status: AC | PRN
  Administered 2024-09-18: 10 mL
  Filled 2024-09-18: qty 10

## 2024-09-18 MED ORDER — IRON SUCROSE 20 MG/ML IV SOLN
200.0000 mg | Freq: Once | INTRAVENOUS | Status: AC
  Administered 2024-09-18: 200 mg via INTRAVENOUS
  Filled 2024-09-18: qty 10

## 2024-09-21 ENCOUNTER — Encounter: Payer: Self-pay | Admitting: Nurse Practitioner

## 2024-09-21 ENCOUNTER — Ambulatory Visit: Admitting: Nurse Practitioner

## 2024-09-21 VITALS — BP 146/72 | HR 83 | Temp 98.1°F | Ht 64.0 in | Wt 129.6 lb

## 2024-09-21 DIAGNOSIS — N320 Bladder-neck obstruction: Secondary | ICD-10-CM | POA: Diagnosis not present

## 2024-09-21 DIAGNOSIS — D500 Iron deficiency anemia secondary to blood loss (chronic): Secondary | ICD-10-CM | POA: Diagnosis not present

## 2024-09-21 DIAGNOSIS — I714 Abdominal aortic aneurysm, without rupture, unspecified: Secondary | ICD-10-CM | POA: Insufficient documentation

## 2024-09-21 NOTE — Progress Notes (Signed)
 BP (!) 146/72   Pulse 83   Temp 98.1 F (36.7 C) (Oral)   Ht 5' 4 (1.626 m)   Wt 129 lb 9.6 oz (58.8 kg)   SpO2 98%   BMI 22.25 kg/m    Subjective:    Patient ID: Ralph Leon, male    DOB: [birth year], 88 y.o.   MRN: [id number]  HPI: Ralph Leon is a 88 y.o. male  Chief Complaint  Patient presents with   Results    Patient here to discuss CT scan results   Patient seen today to discuss CT results.  He has recently been seen by GI and Hematology.  Required a blood transfusion due to low Hemoglobin.  Currently receiving iron infusions.  He does endorse having some difficulty with urinating and sometimes controlling his stream. He does not want to pursue the colonoscopy at this time.   Relevant past medical, surgical, family and social history reviewed and updated as indicated. Interim medical history since our last visit reviewed. Allergies and medications reviewed and updated.  Review of Systems  Genitourinary:  Positive for decreased urine volume and difficulty urinating.    Per HPI unless specifically indicated above     Objective:    BP (!) 146/72   Pulse 83   Temp 98.1 F (36.7 C) (Oral)   Ht 5' 4 (1.626 m)   Wt 129 lb 9.6 oz (58.8 kg)   SpO2 98%   BMI 22.25 kg/m   Wt Readings from Last 3 Encounters:  09/21/24 129 lb 9.6 oz (58.8 kg)  09/06/24 130 lb (59 kg)  08/28/24 129 lb (58.5 kg)    Physical Exam Vitals and nursing note reviewed.  Constitutional:      General: He is not in acute distress.    Appearance: Normal appearance. He is not ill-appearing, toxic-appearing or diaphoretic.  HENT:     Head: Normocephalic.     Right Ear: External ear normal.     Left Ear: External ear normal.     Nose: Nose normal. No congestion or rhinorrhea.     Mouth/Throat:     Mouth: Mucous membranes are moist.  Eyes:     General:        Right eye: No discharge.        Left eye: No discharge.     Extraocular Movements: Extraocular movements intact.      Conjunctiva/sclera: Conjunctivae normal.     Pupils: Pupils are equal, round, and reactive to light.  Cardiovascular:     Rate and Rhythm: Normal rate and regular rhythm.     Heart sounds: No murmur heard. Pulmonary:     Effort: Pulmonary effort is normal. No respiratory distress.     Breath sounds: Normal breath sounds. No wheezing, rhonchi or rales.  Abdominal:     General: Abdomen is flat. Bowel sounds are normal.  Musculoskeletal:     Cervical back: Normal range of motion and neck supple.  Skin:    General: Skin is warm and dry.     Capillary Refill: Capillary refill takes less than 2 seconds.  Neurological:     General: No focal deficit present.     Mental Status: He is alert and oriented to person, place, and time.  Psychiatric:        Mood and Affect: Mood normal.        Behavior: Behavior normal.        Thought Content: Thought content normal.  Judgment: Judgment normal.     Results for orders placed or performed in visit on 09/06/24  Type and screen   Collection Time: 09/06/24  3:44 PM  Result Value Ref Range   ABO/RH(D) O POS    Antibody Screen NEG    Sample Expiration 09/09/2024,2359    Unit Number [id number]    Blood Component Type RED CELLS,LR    Unit division 00    Status of Unit ISSUED,FINAL    Transfusion Status OK TO TRANSFUSE    Crossmatch Result      Compatible Performed at [facility], [address], [place], [place] [address]   BPAM [facility]   Collection Time: 09/06/24  3:44 PM  Result Value Ref Range   ISSUE DATE / TIME [id number]    Blood Product Unit Number [id number]    PRODUCT CODE [id number]    Unit Type and Rh 5100    Blood Product Expiration Date [id number]   Prepare RBC (crossmatch)   Collection Time: 09/06/24  4:01 PM  Result Value Ref Range   Order Confirmation      ORDER PROCESSED BY BLOOD BANK Performed at [facility], [address], [place], [place] [address]       Assessment & Plan:    Problem List Items Addressed This Visit       Cardiovascular and Mediastinum   AAA (abdominal aortic aneurysm)   Measuring 3.3cm.  Recommend surveillance every 3 years.         Other   Iron deficiency anemia (Chronic)   Patient is now followed by Hematology.  Was seen by GI who highly recommended Colonoscopy due to concern for colon cancer.  CT was ordered- results reviewed with patient in person.  Explained this does not rule out colon cancer and the recommendation is to still have a Colonoscopy.  He does endorse that if cancer was found he would want to pursue treatment.  I encouraged him to have the colonoscopy done.  He agrees to have it done if the Urology appointment does not reveal the cause for Low H/H.      Other Visit Diagnoses       Bladder outlet obstruction    -  Primary   Referral placed for patient to see Urology.   Relevant Orders   Ambulatory referral to Urology        Follow up plan: No follow-ups on file.   A total of 30 minutes were spent on this encounter today.  When total time is documented, this includes both the face-to-face and non-face-to-face time personally spent before, during and after the visit on the date of the encounter reviewing CT results, discussing options and concern for cancer, discussing referrals.

## 2024-09-21 NOTE — Assessment & Plan Note (Signed)
 Patient is now followed by Hematology.  Was seen by GI who highly recommended Colonoscopy due to concern for colon cancer.  CT was ordered- results reviewed with patient in person.  Explained this does not rule out colon cancer and the recommendation is to still have a Colonoscopy.  He does endorse that if cancer was found he would want to pursue treatment.  I encouraged him to have the colonoscopy done.  He agrees to have it done if the Urology appointment does not reveal the cause for Low H/H.

## 2024-09-21 NOTE — Assessment & Plan Note (Signed)
 Measuring 3.3cm.  Recommend surveillance every 3 years.

## 2024-09-25 ENCOUNTER — Inpatient Hospital Stay

## 2024-09-27 ENCOUNTER — Inpatient Hospital Stay: Attending: Oncology

## 2024-09-27 VITALS — BP 110/53 | HR 81 | Temp 98.2°F | Resp 18

## 2024-09-27 DIAGNOSIS — Z79899 Other long term (current) drug therapy: Secondary | ICD-10-CM | POA: Insufficient documentation

## 2024-09-27 DIAGNOSIS — D509 Iron deficiency anemia, unspecified: Secondary | ICD-10-CM | POA: Insufficient documentation

## 2024-09-27 DIAGNOSIS — D500 Iron deficiency anemia secondary to blood loss (chronic): Secondary | ICD-10-CM

## 2024-09-27 MED ORDER — IRON SUCROSE 20 MG/ML IV SOLN
200.0000 mg | Freq: Once | INTRAVENOUS | Status: AC
  Administered 2024-09-27: 200 mg via INTRAVENOUS
  Filled 2024-09-27: qty 10

## 2024-09-27 NOTE — Patient Instructions (Signed)

## 2024-10-02 ENCOUNTER — Inpatient Hospital Stay

## 2024-10-02 DIAGNOSIS — D500 Iron deficiency anemia secondary to blood loss (chronic): Secondary | ICD-10-CM

## 2024-10-02 DIAGNOSIS — D509 Iron deficiency anemia, unspecified: Secondary | ICD-10-CM | POA: Diagnosis not present

## 2024-10-02 MED ORDER — IRON SUCROSE 20 MG/ML IV SOLN
200.0000 mg | Freq: Once | INTRAVENOUS | Status: AC
  Administered 2024-10-02: 200 mg via INTRAVENOUS
  Filled 2024-10-02: qty 10

## 2024-10-02 NOTE — Patient Instructions (Signed)

## 2024-10-10 ENCOUNTER — Emergency Department

## 2024-10-10 ENCOUNTER — Other Ambulatory Visit: Payer: Self-pay

## 2024-10-10 ENCOUNTER — Inpatient Hospital Stay
Admission: EM | Admit: 2024-10-10 | Discharge: 2024-10-22 | DRG: 377 | Disposition: A | Discharge status: Skilled Nursing Facility

## 2024-10-10 DIAGNOSIS — Z8249 Family history of ischemic heart disease and other diseases of the circulatory system: Secondary | ICD-10-CM

## 2024-10-10 DIAGNOSIS — R7401 Elevation of levels of liver transaminase levels: Secondary | ICD-10-CM | POA: Diagnosis not present

## 2024-10-10 DIAGNOSIS — Z87891 Personal history of nicotine dependence: Secondary | ICD-10-CM

## 2024-10-10 DIAGNOSIS — I10 Essential (primary) hypertension: Secondary | ICD-10-CM | POA: Diagnosis present

## 2024-10-10 DIAGNOSIS — J9601 Acute respiratory failure with hypoxia: Secondary | ICD-10-CM | POA: Diagnosis present

## 2024-10-10 DIAGNOSIS — K920 Hematemesis: Principal | ICD-10-CM | POA: Diagnosis present

## 2024-10-10 DIAGNOSIS — Z6824 Body mass index (BMI) 24.0-24.9, adult: Secondary | ICD-10-CM

## 2024-10-10 DIAGNOSIS — E860 Dehydration: Secondary | ICD-10-CM | POA: Diagnosis present

## 2024-10-10 DIAGNOSIS — Z66 Do not resuscitate: Secondary | ICD-10-CM | POA: Diagnosis present

## 2024-10-10 DIAGNOSIS — K2100 Gastro-esophageal reflux disease with esophagitis, without bleeding: Secondary | ICD-10-CM | POA: Diagnosis present

## 2024-10-10 DIAGNOSIS — Z79899 Other long term (current) drug therapy: Secondary | ICD-10-CM

## 2024-10-10 DIAGNOSIS — Z8673 Personal history of transient ischemic attack (TIA), and cerebral infarction without residual deficits: Secondary | ICD-10-CM

## 2024-10-10 DIAGNOSIS — K3189 Other diseases of stomach and duodenum: Secondary | ICD-10-CM | POA: Diagnosis present

## 2024-10-10 DIAGNOSIS — K921 Melena: Principal | ICD-10-CM | POA: Diagnosis present

## 2024-10-10 DIAGNOSIS — N179 Acute kidney failure, unspecified: Secondary | ICD-10-CM | POA: Diagnosis present

## 2024-10-10 DIAGNOSIS — J690 Pneumonitis due to inhalation of food and vomit: Secondary | ICD-10-CM | POA: Diagnosis not present

## 2024-10-10 DIAGNOSIS — J90 Pleural effusion, not elsewhere classified: Secondary | ICD-10-CM | POA: Diagnosis not present

## 2024-10-10 DIAGNOSIS — F03918 Unspecified dementia, unspecified severity, with other behavioral disturbance: Secondary | ICD-10-CM | POA: Diagnosis present

## 2024-10-10 DIAGNOSIS — E876 Hypokalemia: Secondary | ICD-10-CM | POA: Diagnosis present

## 2024-10-10 DIAGNOSIS — A419 Sepsis, unspecified organism: Secondary | ICD-10-CM

## 2024-10-10 DIAGNOSIS — D62 Acute posthemorrhagic anemia: Secondary | ICD-10-CM | POA: Diagnosis present

## 2024-10-10 DIAGNOSIS — E43 Unspecified severe protein-calorie malnutrition: Secondary | ICD-10-CM | POA: Diagnosis present

## 2024-10-10 DIAGNOSIS — R7989 Other specified abnormal findings of blood chemistry: Secondary | ICD-10-CM | POA: Diagnosis present

## 2024-10-10 DIAGNOSIS — F1010 Alcohol abuse, uncomplicated: Secondary | ICD-10-CM | POA: Diagnosis present

## 2024-10-10 DIAGNOSIS — Z833 Family history of diabetes mellitus: Secondary | ICD-10-CM

## 2024-10-10 DIAGNOSIS — J449 Chronic obstructive pulmonary disease, unspecified: Secondary | ICD-10-CM | POA: Diagnosis present

## 2024-10-10 DIAGNOSIS — R197 Diarrhea, unspecified: Secondary | ICD-10-CM | POA: Diagnosis present

## 2024-10-10 DIAGNOSIS — I2510 Atherosclerotic heart disease of native coronary artery without angina pectoris: Secondary | ICD-10-CM | POA: Diagnosis present

## 2024-10-10 DIAGNOSIS — K922 Gastrointestinal hemorrhage, unspecified: Secondary | ICD-10-CM | POA: Diagnosis present

## 2024-10-10 DIAGNOSIS — Z5320 Procedure and treatment not carried out because of patient's decision for unspecified reasons: Secondary | ICD-10-CM | POA: Diagnosis present

## 2024-10-10 DIAGNOSIS — E785 Hyperlipidemia, unspecified: Secondary | ICD-10-CM | POA: Diagnosis present

## 2024-10-10 LAB — COMPREHENSIVE METABOLIC PANEL WITH GFR
ALT: 123 U/L — ABNORMAL HIGH (ref 0–44)
AST: 699 U/L — ABNORMAL HIGH (ref 15–41)
Albumin: 3.6 g/dL (ref 3.5–5.0)
Alkaline Phosphatase: 103 U/L (ref 38–126)
Anion gap: 22 — ABNORMAL HIGH (ref 5–15)
BUN: 43 mg/dL — ABNORMAL HIGH (ref 8–23)
CO2: 20 mmol/L — ABNORMAL LOW (ref 22–32)
Calcium: 9.1 mg/dL (ref 8.9–10.3)
Chloride: 101 mmol/L (ref 98–111)
Creatinine, Ser: 3.57 mg/dL — ABNORMAL HIGH (ref 0.61–1.24)
GFR, Estimated: 16 mL/min — ABNORMAL LOW (ref >60)
Glucose, Bld: 106 mg/dL — ABNORMAL HIGH (ref 70–99)
Potassium: 5.4 mmol/L — ABNORMAL HIGH (ref 3.5–5.1)
Sodium: 142 mmol/L (ref 135–145)
Total Bilirubin: 1 mg/dL (ref 0.0–1.2)
Total Protein: 6.5 g/dL (ref 6.5–8.1)

## 2024-10-10 LAB — CBC
HCT: 32.5 % — ABNORMAL LOW (ref 39.0–52.0)
Hemoglobin: 9.7 g/dL — ABNORMAL LOW (ref 13.0–17.0)
MCH: 25.7 pg — ABNORMAL LOW (ref 26.0–34.0)
MCHC: 29.8 g/dL — ABNORMAL LOW (ref 30.0–36.0)
MCV: 86.2 fL (ref 80.0–100.0)
Platelets: 279 K/uL (ref 150–400)
RBC: 3.77 MIL/uL — ABNORMAL LOW (ref 4.22–5.81)
RDW: 20 % — ABNORMAL HIGH (ref 11.5–15.5)
WBC: 30.1 K/uL — ABNORMAL HIGH (ref 4.0–10.5)
nRBC: 0 % (ref 0.0–0.2)

## 2024-10-10 LAB — TYPE AND SCREEN
ABO/RH(D): O POS
Antibody Screen: NEGATIVE

## 2024-10-10 MED ORDER — SODIUM CHLORIDE 0.9 % IV BOLUS
1000.0000 mL | Freq: Once | INTRAVENOUS | Status: AC
  Administered 2024-10-10: 1000 mL via INTRAVENOUS

## 2024-10-10 MED ORDER — SODIUM CHLORIDE 0.9 % IV SOLN
50.0000 ug/h | INTRAVENOUS | Status: DC
  Administered 2024-10-10 – 2024-10-13 (×6): 50 ug/h via INTRAVENOUS
  Filled 2024-10-10 (×8): qty 1

## 2024-10-10 MED ORDER — PANTOPRAZOLE SODIUM 40 MG IV SOLR
40.0000 mg | Freq: Once | INTRAVENOUS | Status: AC
  Administered 2024-10-10: 40 mg via INTRAVENOUS
  Filled 2024-10-10: qty 10

## 2024-10-10 MED ORDER — ONDANSETRON HCL 4 MG/2ML IJ SOLN
INTRAMUSCULAR | Status: AC
  Filled 2024-10-10: qty 2

## 2024-10-10 MED ORDER — PANTOPRAZOLE SODIUM 40 MG IV SOLR
40.0000 mg | Freq: Two times a day (BID) | INTRAVENOUS | Status: DC
  Administered 2024-10-11 – 2024-10-13 (×5): 40 mg via INTRAVENOUS
  Filled 2024-10-10 (×6): qty 10

## 2024-10-10 MED ORDER — LACTATED RINGERS IV SOLN
150.0000 mL/h | INTRAVENOUS | Status: DC
  Administered 2024-10-11: 150 mL/h via INTRAVENOUS

## 2024-10-10 MED ORDER — ONDANSETRON HCL 4 MG/2ML IJ SOLN
4.0000 mg | Freq: Four times a day (QID) | INTRAMUSCULAR | Status: DC | PRN
  Filled 2024-10-10: qty 2

## 2024-10-10 MED ORDER — MAGNESIUM HYDROXIDE 400 MG/5ML PO SUSP: 30.0000 mL | Freq: Every day | ORAL | Status: DC | PRN

## 2024-10-10 MED ORDER — DONEPEZIL HCL 5 MG PO TABS
5.0000 mg | ORAL_TABLET | Freq: Every day | ORAL | Status: DC
  Administered 2024-10-11 – 2024-10-21 (×12): 5 mg via ORAL
  Filled 2024-10-10 (×13): qty 1

## 2024-10-10 MED ORDER — ACETAMINOPHEN 325 MG PO TABS
650.0000 mg | ORAL_TABLET | Freq: Four times a day (QID) | ORAL | Status: DC | PRN
  Administered 2024-10-17: 650 mg via ORAL
  Filled 2024-10-10: qty 2

## 2024-10-10 MED ORDER — METRONIDAZOLE 500 MG/100ML IV SOLN
INTRAVENOUS | Status: AC
  Filled 2024-10-10: qty 100

## 2024-10-10 MED ORDER — TRAZODONE HCL 50 MG PO TABS: 25.0000 mg | ORAL_TABLET | Freq: Every evening | ORAL | Status: DC | PRN

## 2024-10-10 MED ORDER — SODIUM CHLORIDE 0.9 % IV SOLN
500.0000 mg | INTRAVENOUS | Status: DC
  Administered 2024-10-11: 500 mg via INTRAVENOUS
  Filled 2024-10-10 (×2): qty 5

## 2024-10-10 MED ORDER — OCTREOTIDE LOAD VIA INFUSION
50.0000 ug | Freq: Once | INTRAVENOUS | Status: AC
  Administered 2024-10-10: 50 ug via INTRAVENOUS
  Filled 2024-10-10: qty 25

## 2024-10-10 MED ORDER — ONDANSETRON HCL 4 MG PO TABS: 4.0000 mg | ORAL_TABLET | Freq: Four times a day (QID) | ORAL | Status: DC | PRN

## 2024-10-10 MED ORDER — METRONIDAZOLE 500 MG/100ML IV SOLN
500.0000 mg | Freq: Once | INTRAVENOUS | Status: AC
  Administered 2024-10-10: 500 mg via INTRAVENOUS

## 2024-10-10 MED ORDER — ONDANSETRON HCL 4 MG/2ML IJ SOLN
4.0000 mg | Freq: Once | INTRAMUSCULAR | Status: AC
  Administered 2024-10-10: 4 mg via INTRAVENOUS

## 2024-10-10 MED ORDER — ACETAMINOPHEN 650 MG RE SUPP: 650.0000 mg | Freq: Four times a day (QID) | RECTAL | Status: DC | PRN

## 2024-10-10 MED ORDER — SODIUM CHLORIDE 0.9 % IV SOLN
1.0000 g | Freq: Once | INTRAVENOUS | Status: AC
  Administered 2024-10-10: 1 g via INTRAVENOUS
  Filled 2024-10-10: qty 10

## 2024-10-10 NOTE — Assessment & Plan Note (Addendum)
-   This is likely secondary to his aspiration pneumonia in the setting of his GI bleeding. - Management as above

## 2024-10-10 NOTE — H&P (Incomplete)
 [place]   PATIENT NAME: Ralph Leon    MR#:  [id number]  DATE OF BIRTH:  07-Apr-1936  DATE OF ADMISSION:  10/10/2024  PRIMARY CARE PHYSICIAN: [name], NP   Patient is coming from: Home  REQUESTING/REFERRING PHYSICIAN: Fernand Craw, MD  CHIEF COMPLAINT:   Chief Complaint  Patient presents with   Hematemesis    HISTORY OF PRESENT ILLNESS:  Ralph Leon is a 88 y.o. patient male with medical history significant for COPD, essential hypertension and CVA, presented to the emergency room with acute onset of coffee-ground emesis with associated melena once today.  And not being on any blood thinners.  He has been feeling weak for the past day.  Denied any abdominal pain but has been having diarrhea and stools have been mostly brown.  He had coffee-ground emesis twice.  He used to drink alcohol in the past.  No chest pain or shortness of breath or cough or wheezing or hemoptysis.  No dysuria, oliguria or hematuria or flank pain.  No other bleeding diathesis.  He has been recommended GI workup before but was hesitant to have it done.  He has been following with oncology for possible underlying malignancy when he was recommended GI workup.  ED Course: When he came to the ER, BP was 121/57 with otherwise normal vital signs.  Labs revealed revealed potassium of 5.4 and CO2 of 20 with BUN of 43 and creatinine 3.57 previously normal last month and anion gap of 22, AST 699 ALT 123.  CBC showed significantly cytosis of 30.1 compared to 7.6 on 09/06/2024, hemoglobin 9.7 and hematocrit 32.5 that are much better than last month with platelets of 279.  Blood group was O+ with negative antibody screen. EKG as reviewed by me : None Imaging: None.  The patient was given 1 L bolus of IV normal saline, 40 mg of IV Protonix, 4 mg of IV Zofran, 50 mcg of IV octreotide bolus and infusion, 500 mg of IV Flagyl and 1 g of IV Rocephin.  He will be admitted to a progressive unit bed for further  evaluation and management. PAST MEDICAL HISTORY:   Past Medical History:  Diagnosis Date   COPD (chronic obstructive pulmonary disease) (HCC)    Hypertension    Stroke (HCC)     PAST SURGICAL HISTORY:  No past surgical history on file. No pertinent previous surgeries. SOCIAL HISTORY:   Social History   Tobacco Use   Smoking status: Former    Current packs/day: 0.25    Average packs/day: 0.3 packs/day for 64.0 years (16.0 ttl pk-yrs)    Types: Cigarettes   Smokeless tobacco: Never   Tobacco comments:    5-6 a day. 03/06/2021  Substance Use Topics   Alcohol use: Not Currently    FAMILY HISTORY:   Family History  Problem Relation Age of Onset   Diabetes Mother    Heart attack Father     DRUG ALLERGIES:  No Known Allergies  REVIEW OF SYSTEMS:   ROS As per history of present illness. All pertinent systems were reviewed above. Constitutional, HEENT, cardiovascular, respiratory, GI, GU, musculoskeletal, neuro, psychiatric, endocrine, integumentary and hematologic systems were reviewed and are otherwise negative/unremarkable except for positive findings mentioned above in the HPI.   MEDICATIONS AT HOME:   Prior to Admission medications   Medication Sig Start Date End Date Taking? Authorizing Provider  albuterol  (VENTOLIN  HFA) 108 (90 Base) MCG/ACT inhaler INHALE 2 PUFFS BY MOUTH EVERY 6 HOURS AS  NEEDED FOR WHEEZING FOR SHORTNESS OF BREATH 09/05/24   [name], NP  aspirin  EC 81 MG tablet Take 1 tablet (81 mg total) by mouth daily. Swallow whole. 06/02/21   Darliss Rogue, MD  atorvastatin  (LIPITOR) 40 MG tablet Take 1 tablet by mouth once daily 05/11/24   [name], NP  donepezil (ARICEPT) 5 MG tablet Take 5 mg by mouth at bedtime. 08/14/24   [provider]  lisinopril  (ZESTRIL ) 20 MG tablet Take 1 tablet by mouth once daily 07/17/24   [name], NP      VITAL SIGNS:  Blood pressure (!) 140/64, pulse 98, temperature (!) 97.5 F (36.4  C), temperature source Oral, resp. rate (!) 27, height 5' 4 (1.626 m), weight 63.5 kg, SpO2 93%.  PHYSICAL EXAMINATION:  Physical Exam  GENERAL:  88 y.o.-year-old patient lying in the bed with no acute distress.  EYES: Pupils equal, round, reactive to light and accommodation. No scleral icterus. Extraocular muscles intact.  HEENT: Head atraumatic, normocephalic. Oropharynx and nasopharynx clear.  NECK:  Supple, no jugular venous distention. No thyroid enlargement, no tenderness.  LUNGS: Normal breath sounds bilaterally, no wheezing, rales,rhonchi or crepitation. No use of accessory muscles of respiration.  CARDIOVASCULAR: Regular rate and rhythm, S1, S2 normal. No murmurs, rubs, or gallops.  ABDOMEN: Soft, nondistended, nontender. Bowel sounds present. No organomegaly or mass.  EXTREMITIES: No pedal edema, cyanosis, or clubbing.  NEUROLOGIC: Cranial nerves II through XII are intact. Muscle strength 5/5 in all extremities. Sensation intact. Gait not checked.  PSYCHIATRIC: The patient is alert and oriented x 3.  Normal affect and good eye contact. SKIN: No obvious rash, lesion, or ulcer.   LABORATORY PANEL:   CBC Recent Labs  Lab 10/10/24 2037  WBC 30.1*  HGB 9.7*  HCT 32.5*  PLT 279   ------------------------------------------------------------------------------------------------------------------  Chemistries  Recent Labs  Lab 10/10/24 2037  NA 142  K 5.4*  CL 101  CO2 20*  GLUCOSE 106*  BUN 43*  CREATININE 3.57*  CALCIUM  9.1  AST 699*  ALT 123*  ALKPHOS 103  BILITOT 1.0   ------------------------------------------------------------------------------------------------------------------  Cardiac Enzymes No results for input(s): TROPONINI in the last 168 hours. ------------------------------------------------------------------------------------------------------------------  RADIOLOGY:  DG Chest 1 View Result Date: 10/10/2024 CLINICAL DATA:  Possible  aspiration. EXAM: CHEST  1 VIEW COMPARISON:  Chest CT 03/08/2020 FINDINGS: The heart size and mediastinal contours are within normal limits. There is likely linear external artifact overlying the right upper chest. There is no focal lung infiltrate, pleural effusion or pneumothorax. The visualized skeletal structures are unremarkable. IMPRESSION: No active disease. Electronically Signed   By: Greig Pique M.D.   On: 10/10/2024 22:16      IMPRESSION AND PLAN:  Assessment and Plan: * GI bleeding - This likely of upper GI etiology. - She will be admitted to a progressive unit bed. - Will follow serial hemoglobins and hematocrits. - Will continue hydration with IV normal saline. - Aspirin  will obviously be held off. - Will continue him on IV Protonix and octreotide. - GI consult will be obtained. - I notified Dr. Maryruth about the patient.  Transaminitis - The patient has an AST:ALT ratio higher than 5: 1, strongly suggesting alcoholic liver disease and possibly alcoholic hepatitis being new compared to last month. - Will obtain viral hepatitis panel. - We will follow LFTs with hydration. - This could be part of hepatorenal syndrome  AKI (acute kidney injury) - Dislike secondary to significant dehydration from recurrent nausea and vomiting and  anorexia. - We will hold off nephrotoxins. - Will hydrate with IV normal saline and follow BMP.  Sepsis due to undetermined organism [facility]) - This is likely secondary to his aspiration pneumonia in the setting of his GI bleeding. - Management as above  Acute respiratory failure with hypoxia (HCC) - This is likely secondary to aspiration pneumonitis. - He was placed on high flow nasal cannula with improving oxygen saturations. - Will place him on IV Unasyn and keep him n.p.o. - Bronchodilator therapy as well as mucolytic therapy will be provided as needed.  Dyslipidemia - Will hold off statin therapy given his elevated  transaminases.  Dementia with behavioral disturbance (HCC) - Will continue Aricept.  Essential hypertension Will hold off diuretic therapy given AKI and GI bleeding as well as lisinopril  given AKI. - Will be placed on as needed IV labetalol.   DVT prophylaxis: SCDs. Advanced Care Planning:  Code Status: The patient is DNR and DNI.  This was discussed with him. Family Communication:  The plan of care was discussed in details with the patient (and family). I answered all questions. The patient agreed to proceed with the above mentioned plan. Further management will depend upon hospital course. Disposition Plan: Back to previous home environment Consults called: GI. All the records are reviewed and case discussed with ED provider.  Status is: Inpatient  At the time of the admission, it appears that the appropriate admission status for this patient is inpatient.  This is judged to be reasonable and necessary in order to provide the required intensity of service to ensure the patient's safety given the presenting symptoms, physical exam findings and initial radiographic and laboratory data in the context of comorbid conditions.  The patient requires inpatient status due to high intensity of service, high risk of further deterioration and high frequency of surveillance required.  I certify that at the time of admission, it is my clinical judgment that the patient will require inpatient hospital care extending more than 2 midnights.                            Dispo: The patient is from: Home              Anticipated d/c is to: Home              Patient currently is not medically stable to d/c.              Difficult to place patient: No  [place] DELENA Peaches M.D on 10/11/2024 at 1:46 AM  [facility]   From 7 PM-7 AM, contact night-coverage [email or web address]  CC: Primary care physician; [name], NP

## 2024-10-10 NOTE — Assessment & Plan Note (Signed)
-   Will continue Aricept .

## 2024-10-10 NOTE — Assessment & Plan Note (Addendum)
-   This likely of upper GI etiology. - She will be admitted to a progressive unit bed. - Will follow serial hemoglobins and hematocrits. - Will continue hydration with IV normal saline. - Aspirin  will obviously be held off. - Will continue him on IV Protonix and octreotide. - GI consult will be obtained. - I notified Dr. Maryruth about the patient.

## 2024-10-10 NOTE — Assessment & Plan Note (Signed)
-   Dislike secondary to significant dehydration from recurrent nausea and vomiting and anorexia. - We will hold off nephrotoxins. - Will hydrate with IV normal saline and follow BMP.

## 2024-10-10 NOTE — Assessment & Plan Note (Signed)
 Will hold off diuretic therapy given AKI and GI bleeding as well as lisinopril  given AKI. - Will be placed on as needed IV labetalol.

## 2024-10-10 NOTE — Assessment & Plan Note (Signed)
-   The patient has an AST:ALT ratio higher than 5: 1, strongly suggesting alcoholic liver disease and possibly alcoholic hepatitis being new compared to last month. - Will obtain viral hepatitis panel. - We will follow LFTs with hydration. - This could be part of hepatorenal syndrome

## 2024-10-10 NOTE — Assessment & Plan Note (Signed)
-   Will hold off statin therapy given his elevated transaminases.

## 2024-10-10 NOTE — Assessment & Plan Note (Signed)
-   This is likely secondary to aspiration pneumonitis. - He was placed on high flow nasal cannula with improving oxygen saturations. - Will place him on IV Unasyn and keep him n.p.o. - Bronchodilator therapy as well as mucolytic therapy will be provided as needed.

## 2024-10-10 NOTE — ED Notes (Signed)
 Pts saturation alarming low mid 50's. This RN assessed and found readings to be true with good pleth and matching HR. Sites changed and verified. High flow O2 placed followed by NRB at 15L with readings increasing to 93-94%. MD Fernand notified of the same and RT called with the request of heated high flow placement. Family at the bedside. VSS.

## 2024-10-10 NOTE — ED Provider Notes (Signed)
 [facility] Provider Note    Event Date/Time   First MD Initiated Contact with Patient 10/10/24 2020     (approximate)   History   Hematemesis   HPI  Ralph Leon is a 88 y.o. male hematemesis.  Patient is presenting today with concern of hematemesis ongoing for 1 to 2 days.  He started having repeated episodes of vomiting dark emesis yesterday and also melanotic stool.  Started complaining of some lightheadedness as well yesterday.  Denies any increased abdominal pain chest pain shortness of breath or any other affiliated symptoms primarily just concern of the blood in his stool.  Apparently has had this occur to him before in the past.  He informs me that he is currently being worked up with an oncologist due to concern of a possible underlying malignancy but has not had a full workup done yet.  I did review oncology note, apparently they were recommending GI workup but patient was hesitant to have this worked up.  He denies any blood thinner usage, remote history of alcohol usage but no history of alcohol abuse or binge drinking.      Physical Exam   Triage Vital Signs: ED Triage Vitals [10/10/24 2026]  Encounter Vitals Group     BP      Girls Systolic BP Percentile      Girls Diastolic BP Percentile      Boys Systolic BP Percentile      Boys Diastolic BP Percentile      Pulse      Resp      Temp      Temp src      SpO2 96 %     Weight      Height      Head Circumference      Peak Flow      Pain Score      Pain Loc      Pain Education      Exclude from Growth Chart     Most recent vital signs: Vitals:   10/10/24 2132 10/10/24 2148  BP: (!) 171/72 (!) 140/64  Pulse: 88 98  Resp: 18 (!) 27  Temp:    SpO2: 95% 93%     General: Awake, no distress.  CV:  Good peripheral perfusion.  Resp:  Normal effort.  Able to speak in full sentences Abd:  No distention.  Soft nontender, he is covered in dark emesis Other:     ED Results /  Procedures / Treatments   Labs (all labs ordered are listed, but only abnormal results are displayed) Labs Reviewed  COMPREHENSIVE METABOLIC PANEL WITH GFR - Abnormal; Notable for the following components:      Result Value   Potassium 5.4 (*)    CO2 20 (*)    Glucose, Bld 106 (*)    BUN 43 (*)    Creatinine, Ser 3.57 (*)    AST 699 (*)    ALT 123 (*)    GFR, Estimated 16 (*)    Anion gap 22 (*)    All other components within normal limits  CBC - Abnormal; Notable for the following components:   WBC 30.1 (*)    RBC 3.77 (*)    Hemoglobin 9.7 (*)    HCT 32.5 (*)    MCH 25.7 (*)    MCHC 29.8 (*)    RDW 20.0 (*)    All other components within normal limits  PROTIME-INR  CORTISOL-AM, BLOOD  BASIC METABOLIC PANEL WITH GFR  CBC  POC OCCULT BLOOD, ED  TYPE AND SCREEN     EKG     RADIOLOGY No acute findings appreciated on chest x-ray  PROCEDURES:  Critical Care performed: Yes, see critical care procedure note(s)  .Critical Care  Performed by: Fernand Rossie HERO, MD Authorized by: Fernand Rossie HERO, MD   Critical care provider statement:    Critical care time (minutes):  60   Critical care was time spent personally by me on the following activities:  Development of treatment plan with patient or surrogate, discussions with consultants, evaluation of patient's response to treatment, examination of patient, ordering and review of laboratory studies, ordering and review of radiographic studies, ordering and performing treatments and interventions, pulse oximetry, re-evaluation of patient's condition and review of old charts   Care discussed with: admitting provider      MEDICATIONS ORDERED IN ED: Medications  octreotide (SANDOSTATIN) 2 mcg/mL load via infusion 50 mcg (50 mcg Intravenous Bolus from Bag 10/10/24 2130)    And  octreotide (SANDOSTATIN) 500 mcg in sodium chloride 0.9 % 250 mL (2 mcg/mL) infusion (50 mcg/hr Intravenous New Bag/Given 10/10/24 2129)   metroNIDAZOLE (FLAGYL) IVPB 500 mg (500 mg Intravenous New Bag/Given 10/10/24 2218)  donepezil (ARICEPT) tablet 5 mg (has no administration in time range)  lactated ringers infusion (has no administration in time range)  azithromycin (ZITHROMAX) 500 mg in sodium chloride 0.9 % 250 mL IVPB (has no administration in time range)  acetaminophen (TYLENOL) tablet 650 mg (has no administration in time range)    Or  acetaminophen (TYLENOL) suppository 650 mg (has no administration in time range)  magnesium hydroxide (MILK OF MAGNESIA) suspension 30 mL (has no administration in time range)  ondansetron (ZOFRAN) tablet 4 mg (has no administration in time range)    Or  ondansetron (ZOFRAN) injection 4 mg (has no administration in time range)  traZODone (DESYREL) tablet 25 mg (has no administration in time range)  ondansetron (ZOFRAN) 4 MG/2ML injection (has no administration in time range)  metroNIDAZOLE (FLAGYL) 500 MG/100ML IVPB (has no administration in time range)  pantoprazole (PROTONIX) injection 40 mg (40 mg Intravenous Given 10/10/24 2057)  cefTRIAXone (ROCEPHIN) 1 g in sodium chloride 0.9 % 100 mL IVPB (0 g Intravenous Stopped 10/10/24 2139)  ondansetron (ZOFRAN) injection 4 mg (4 mg Intravenous Given 10/10/24 2142)  sodium chloride 0.9 % bolus 1,000 mL (1,000 mLs Intravenous Bolus from Bag 10/10/24 2202)     IMPRESSION / MDM / ASSESSMENT AND PLAN / ED COURSE  I reviewed the triage vital signs and the nursing notes.                               Patient's presentation is most consistent with acute presentation with potential threat to life or bodily function.  88 year old male who presents today with concern of a GI bleed.  He presents covered in emesis, fortunately his vitals here are reassuring is able to speak in full sentences.  Will obtain labs here at this time, he does not have any significant abdominal discomfort, given the presence of the melena, suspect likely upper GI bleed.   He does endorse that he has a remote history of alcohol use, but states that there is no abuse history.  His abdominal exam is benign here, he does have a history of a known AAA, however given the reassuring exam and the reassuring vitals I believe  reasonable to hold off on imaging for now.  Will follow-up with labs and determine further workup accordingly.   Clinical Course as of 10/10/24 2311  Tue Oct 10, 2024  2147 Labs are returning, he has a significant leukocytosis but no clear source for this at this time, also has developed a significant AKI here as well as significant transaminitis.  No clear source for this at this time.  I am reaching out to medicine for admission he did have concern of a possible aspiration event while here as his oxygen saturations are decreasing, I have added on chest x-ray giving him a dose of Zofran and also adding on Flagyl to cover for possible aspiration.  He does endorse that he wishes to be full code at this time. [SK]  2309 I spoke with Dr. Lawence from medicine team who was agreed to evaluate the patient to determine course for further medical management at this time. [SK]    Clinical Course User Index [SK] Fernand Rossie HERO, MD     FINAL CLINICAL IMPRESSION(S) / ED DIAGNOSES   Final diagnoses:  Gastrointestinal hemorrhage with melena  AKI (acute kidney injury)  Transaminitis  Aspiration pneumonitis (HCC)     Rx / DC Orders   ED Discharge Orders     None        Note:  This document was prepared using Dragon voice recognition software and may include unintentional dictation errors.   Fernand Rossie HERO, MD 10/10/24 [phone]

## 2024-10-10 NOTE — ED Triage Notes (Signed)
 Pt arrived via EMS from home for dark tar stools x1 day. No blood thinners. Pt states he have been feeling week for the past day as well. Pt denies abdominal pain. Pt has been having diarrhea. States stool has been brown. Pt does state he has been vomiting x2 days black emesis. Pt does confirm he use to be a drinker.

## 2024-10-11 ENCOUNTER — Ambulatory Visit: Admitting: Urology

## 2024-10-11 ENCOUNTER — Encounter: Payer: Self-pay | Admitting: Family Medicine

## 2024-10-11 DIAGNOSIS — K922 Gastrointestinal hemorrhage, unspecified: Secondary | ICD-10-CM

## 2024-10-11 DIAGNOSIS — K920 Hematemesis: Secondary | ICD-10-CM | POA: Diagnosis not present

## 2024-10-11 DIAGNOSIS — D500 Iron deficiency anemia secondary to blood loss (chronic): Secondary | ICD-10-CM

## 2024-10-11 LAB — CBC
HCT: 31.2 % — ABNORMAL LOW (ref 39.0–52.0)
Hemoglobin: 9.3 g/dL — ABNORMAL LOW (ref 13.0–17.0)
MCH: 25.4 pg — ABNORMAL LOW (ref 26.0–34.0)
MCHC: 29.8 g/dL — ABNORMAL LOW (ref 30.0–36.0)
MCV: 85.2 fL (ref 80.0–100.0)
Platelets: 237 K/uL (ref 150–400)
RBC: 3.66 MIL/uL — ABNORMAL LOW (ref 4.22–5.81)
RDW: 19.9 % — ABNORMAL HIGH (ref 11.5–15.5)
WBC: 17.8 K/uL — ABNORMAL HIGH (ref 4.0–10.5)
nRBC: 0 % (ref 0.0–0.2)

## 2024-10-11 LAB — BASIC METABOLIC PANEL WITH GFR
Anion gap: 16 — ABNORMAL HIGH (ref 5–15)
BUN: 50 mg/dL — ABNORMAL HIGH (ref 8–23)
CO2: 20 mmol/L — ABNORMAL LOW (ref 22–32)
Calcium: 8.1 mg/dL — ABNORMAL LOW (ref 8.9–10.3)
Chloride: 106 mmol/L (ref 98–111)
Creatinine, Ser: 3.63 mg/dL — ABNORMAL HIGH (ref 0.61–1.24)
GFR, Estimated: 16 mL/min — ABNORMAL LOW (ref >60)
Glucose, Bld: 110 mg/dL — ABNORMAL HIGH (ref 70–99)
Potassium: 5 mmol/L (ref 3.5–5.1)
Sodium: 142 mmol/L (ref 135–145)

## 2024-10-11 LAB — HEPATITIS PANEL, ACUTE
HCV Ab: NONREACTIVE
Hep A IgM: NONREACTIVE
Hep B C IgM: NONREACTIVE
Hepatitis B Surface Ag: NONREACTIVE

## 2024-10-11 LAB — PROTIME-INR
INR: 1.9 — ABNORMAL HIGH (ref 0.8–1.2)
Prothrombin Time: 22.5 s — ABNORMAL HIGH (ref 11.4–15.2)

## 2024-10-11 LAB — CORTISOL-AM, BLOOD: Cortisol - AM: 52.4 ug/dL — ABNORMAL HIGH (ref 6.7–22.6)

## 2024-10-11 LAB — HEPATIC FUNCTION PANEL
ALT: 103 U/L — ABNORMAL HIGH (ref 0–44)
AST: 502 U/L — ABNORMAL HIGH (ref 15–41)
Albumin: 3.2 g/dL — ABNORMAL LOW (ref 3.5–5.0)
Alkaline Phosphatase: 107 U/L (ref 38–126)
Bilirubin, Direct: 0.4 mg/dL — ABNORMAL HIGH (ref 0.0–0.2)
Indirect Bilirubin: 0.4 mg/dL (ref 0.3–0.9)
Total Bilirubin: 0.8 mg/dL (ref 0.0–1.2)
Total Protein: 5.8 g/dL — ABNORMAL LOW (ref 6.5–8.1)

## 2024-10-11 MED ORDER — LACTATED RINGERS IV SOLN: INTRAVENOUS | Status: AC

## 2024-10-11 NOTE — ED Notes (Signed)
 Pt's brief changed by this RN and Jordan, NT at this time. MD at bedside.

## 2024-10-11 NOTE — ED Notes (Signed)
 Called CCMD for central monitoring at this time

## 2024-10-11 NOTE — ED Notes (Signed)
 Pt A&Ox4. Call bell within reach. Bed alarm on.

## 2024-10-11 NOTE — ED Notes (Signed)
 Called Orlean (pt's friend) to give an update per patient's approval.

## 2024-10-11 NOTE — Plan of Care (Signed)
   Problem: Fluid Volume: Goal: Hemodynamic stability will improve Outcome: Progressing

## 2024-10-11 NOTE — ED Notes (Signed)
Fall bundle in place

## 2024-10-11 NOTE — Progress Notes (Signed)
 PROGRESS NOTE    Hamp Moreland  FMW:[id number] DOB: May 18, 1936 DOA: 10/10/2024 PCP: [name], NP  252A/252A-AA  LOS: 1 day   Brief hospital course:   Assessment & Plan: Ralph Leon is a 88 y.o. patient male with medical history significant for COPD, essential hypertension and CVA, presented to the emergency room with acute onset of coffee-ground emesis with associated melena once today.  And not being on any blood thinners.  He has been feeling weak for the past day.  Denied any abdominal pain but has been having diarrhea and stools have been mostly brown.  He had coffee-ground emesis twice.  He used to drink alcohol in the past.  No chest pain or shortness of breath or cough or wheezing or hemoptysis.  No dysuria, oliguria or hematuria or flank pain.  No other bleeding diathesis.  He has been recommended GI workup before but was hesitant to have it done.  He has been following with oncology for possible underlying malignancy when he was recommended GI workup.    * Likely upper GI bleeding --coffee-ground emesis and anemia --GI consulted.  Pt is on heated hf, so will hold procedure for now. --monitor Hgb --cont IV PPI BID  Acute respiratory failure with hypoxia (HCC) --after presentation, pt became hypoxic and needed heated hf.  CXR on presentation wnl.  Unclear etiology, thought thought to be due to aspiration?  Thought pt denied aspiration events. --Continue supplemental O2 to keep sats >=90%, wean as tolerated  Transaminitis - The patient has an AST:ALT ratio higher than 5: 1, strongly suggesting alcoholic liver disease and possibly alcoholic hepatitis being new compared to last month. - Will obtain viral hepatitis panel. - We will follow LFTs with hydration. - This could be part of hepatorenal syndrome  AKI (acute kidney injury) - likely secondary to significant dehydration from recurrent nausea and vomiting and anorexia. --cont MIVF  Sepsis, ruled  out  Dyslipidemia - Will hold off statin therapy given his elevated transaminases.  Dementia with behavioral disturbance (HCC) - Will continue Aricept.  Essential hypertension Will hold off diuretic therapy given AKI and GI bleeding as well as lisinopril  given AKI. - Will be placed on as needed IV labetalol.  Hx of alcohol abuse --pt denied recent use   DVT prophylaxis: SCD/Compression stockings Code Status: DNR  Family Communication:  Level of care: Progressive Dispo:   The patient is from: home Anticipated d/c is to: home Anticipated d/c date is: 2-3 days   Subjective and Interval History:  Pt reported dyspnea sometimes.  No throat pain, no abdominal pain.   Objective: Vitals:   10/11/24 1530 10/11/24 1545 10/11/24 1700 10/11/24 1745  BP: (!) 118/55  (!) 143/61 (!) 128/54  Pulse: 93 84 70 85  Resp: (!) 21 19 18 18   Temp:    98.1 F (36.7 C)  TempSrc:      SpO2: 90% 94% 97% 98%  Weight:      Height:        Intake/Output Summary (Last 24 hours) at 10/11/2024 1924 Last data filed at 10/11/2024 9057 Gross per 24 hour  Intake 2710.42 ml  Output --  Net 2710.42 ml   Filed Weights   10/10/24 2028  Weight: 63.5 kg    Examination:   Constitutional: NAD, AAOx3 HEENT: conjunctivae and lids normal, EOMI CV: No cyanosis.   RESP: normal respiratory effort, on heated hf Neuro: II - XII grossly intact.   Psych: Normal mood and affect.  Appropriate judgement and reason  Data Reviewed: I have personally reviewed labs and imaging studies  Time spent: 50 minutes  Ellouise Haber, MD [facility] If 7PM-7AM, please contact night-coverage 10/11/2024, 7:24 PM

## 2024-10-11 NOTE — Consult Note (Signed)
 Rogelia Copping, MD [facility]  [address] [place], [place] [address] Phone: [phone] Fax : [phone]  Consultation  Referring Provider:     Dr. Lawence Primary Care Physician:  [name], NP Primary Gastroenterologist:   [facility]         Reason for Consultation:     GI bleed  Date of Admission:  10/10/2024 Date of Consultation:  10/11/2024   HPI:   Shriyans Kuenzi is a 88 y.o. male who was seen as an outpatient 1 month ago at [facility] clinic GI for anemia and was recommended to continue IV iron and to proceed with an EGD and colonoscopy.  The patient was also scheduled for a CT scan of the abdomen. Patient was referred to GI as an outpatient by his primary care provider due to iron deficiency anemia and a positive FIT test.  The patient had previous labs that showed a hemoglobin of 8.1 with an MCV of 87 with an iron of 9 and a saturation of 3%.  The patient was evaluated and seen with Dr. Babara from hematology and repeat labs showed similar findings.  At that time the patient was transfused 1 unit of blood and had iron infusions.  The patient had reported no sign of any GI bleeding such as hematochezia or melena or hematemesis.  He had reported that his previous colonoscopy was over 20 years prior.  His hemoglobin over a year ago was greater than 13.  It was recommended to undergo any EGD and colonoscopy by his outpatient GI team and had been reluctant to do so and wanted to proceed with the CT scan first.  CT scan from October 23rd of this year showed:  IMPRESSION: 1. Diffuse colonic diverticulosis without diverticulitis. 2. Cholelithiasis without cholecystitis. 3. Enlarged prostate, with bladder wall trabeculations compatible with chronic bladder outlet obstruction. 4. Abdominal aortic aneurysm measuring 3.3 cm. Recommend surveillance ultrasound in 3 years. Reference: Journal of Vascular Surgery 67.1 (2018): 2-77. J Am Coll Radiol [phone]. 5.  Aortic  Atherosclerosis   Patient has a history of COPD, hypertension and a CVA and was admitted with acute onset coffee-ground emesis and melena that happened twice prior to admission.  The patient had denied any abdominal pain in the ED but was having diarrhea.  The patient started to feel some lightheadedness prior to admission.  The patient's most recent liver enzymes showed:  Component     Latest Ref Rng 09/05/2024 10/10/2024 10/11/2024  Alkaline Phosphatase     38 - 126 U/L 75  103  107   AST     15 - 41 U/L 23  699 (H)  502 (H)   ALT     0 - 44 U/L 19  123 (H)  103 (H)   Bilirubin, Direct     0.0 - 0.2 mg/dL   0.4 (H)   Indirect Bilirubin     0.3 - 0.9 mg/dL   0.4    The patient's most recent CBCs have shown:  Component     Latest Ref Rng 09/06/2024 10/10/2024 10/11/2024  WBC     4.0 - 10.5 K/uL 7.6  30.1 (H)  17.8 (H)   RBC     4.22 - 5.81 MIL/uL 2.66 (L)  3.77 (L)  3.66 (L)   Hemoglobin     13.0 - 17.0 g/dL 6.8 (LL)  9.7 (L)  9.3 (L)   HCT     39.0 - 52.0 % 22.6 (L)  32.5 (L)  31.2 (L)    The patient's INR is also prolonged at 1.9.  The patient denies any anticoagulation.  The patient also reports that he makes his own medical decisions.   Past Medical History:  Diagnosis Date   COPD (chronic obstructive pulmonary disease) (HCC)    Hypertension    Stroke (HCC)     No past surgical history on file.  Prior to Admission medications   Medication Sig Start Date End Date Taking? Authorizing Provider  albuterol  (VENTOLIN  HFA) 108 (90 Base) MCG/ACT inhaler INHALE 2 PUFFS BY MOUTH EVERY 6 HOURS AS NEEDED FOR WHEEZING FOR SHORTNESS OF BREATH 09/05/24  Yes [name], NP  aspirin  EC 81 MG tablet Take 1 tablet (81 mg total) by mouth daily. Swallow whole. 06/02/21  Yes Darliss Rogue, MD  atorvastatin  (LIPITOR) 40 MG tablet Take 1 tablet by mouth once daily 05/11/24  Yes [name], NP  donepezil (ARICEPT) 5 MG tablet Take 5 mg by mouth at bedtime. 08/14/24  Yes  [provider]  hydrochlorothiazide  (HYDRODIURIL ) 12.5 MG tablet Take 12.5 mg by mouth daily.   Yes [provider]  lisinopril  (ZESTRIL ) 20 MG tablet Take 1 tablet by mouth once daily 07/17/24  Yes [name], NP  vitamin D3 (CHOLECALCIFEROL) 25 MCG tablet Take 1,000 Units by mouth daily.   Yes [provider]    Family History  Problem Relation Age of Onset   Diabetes Mother    Heart attack Father      Social History   Tobacco Use   Smoking status: Former    Current packs/day: 0.25    Average packs/day: 0.3 packs/day for 64.0 years (16.0 ttl pk-yrs)    Types: Cigarettes   Smokeless tobacco: Never   Tobacco comments:    5-6 a day. 03/06/2021  Vaping Use   Vaping status: Never Used  Substance Use Topics   Alcohol use: Not Currently   Drug use: Not Currently    Allergies as of 10/10/2024   (No Known Allergies)    Review of Systems:    All systems reviewed and negative except where noted in HPI.   Physical Exam:  Vital signs in last 24 hours: Temp:  [97.5 F (36.4 C)-97.7 F (36.5 C)] 97.7 F (36.5 C) (11/19 0700) Pulse Rate:  [63-98] 83 (11/19 0730) Resp:  [17-27] 22 (11/19 0730) BP: (123-171)/(50-76) 134/54 (11/19 0730) SpO2:  [92 %-98 %] 96 % (11/19 0730) FiO2 (%):  [45 %-50 %] 45 % (11/19 0816) Weight:  [63.5 kg] 63.5 kg (11/18 2028) Last BM Date : 10/11/24 General:   Pleasant, cooperative in NAD Head:  Normocephalic and atraumatic. Eyes:   No icterus.   Conjunctiva pink. PERRLA. Ears:  Normal auditory acuity. Neck:  Supple; no masses or thyroidomegaly Lungs: Respirations even and unlabored. Lungs clear to auscultation bilaterally.   No wheezes, crackles, or rhonchi.  Heart:  Regular rate and rhythm;  Without murmur, clicks, rubs or gallops Abdomen:  Soft, nondistended, nontender. Normal bowel sounds. No appreciable masses or hepatomegaly.  No rebound or guarding.  Rectal:  Not performed. Msk:  Symmetrical without gross  deformities.   Extremities:  Without edema, cyanosis or clubbing. Neurologic:  Alert and oriented x3;  grossly normal neurologically. Skin:  Intact without significant lesions or rashes. Cervical Nodes:  No significant cervical adenopathy. Psych:  Alert and cooperative. Normal affect.  LAB RESULTS: Recent Labs    10/10/24 2037 10/11/24 0330  WBC 30.1* 17.8*  HGB 9.7* 9.3*  HCT 32.5* 31.2*  PLT 279 237   BMET Recent Labs    10/10/24 2037 10/11/24 0330  NA 142 142  K 5.4* 5.0  CL 101 106  CO2 20* 20*  GLUCOSE 106* 110*  BUN 43* 50*  CREATININE 3.57* 3.63*  CALCIUM  9.1 8.1*   LFT Recent Labs    10/11/24 0330  PROT 5.8*  ALBUMIN 3.2*  AST 502*  ALT 103*  ALKPHOS 107  BILITOT 0.8  BILIDIR 0.4*  IBILI 0.4   PT/INR Recent Labs    10/11/24 0330  LABPROT 22.5*  INR 1.9*    STUDIES: DG Chest 1 View Result Date: 10/10/2024 CLINICAL DATA:  Possible aspiration. EXAM: CHEST  1 VIEW COMPARISON:  Chest CT 03/08/2020 FINDINGS: The heart size and mediastinal contours are within normal limits. There is likely linear external artifact overlying the right upper chest. There is no focal lung infiltrate, pleural effusion or pneumothorax. The visualized skeletal structures are unremarkable. IMPRESSION: No active disease. Electronically Signed   By: Greig Pique M.D.   On: 10/10/2024 22:16      Impression / Plan:   Assessment: Principal Problem:   GI bleeding Active Problems:   Essential hypertension   Dyslipidemia   AKI (acute kidney injury)   Transaminitis   Dementia with behavioral disturbance (HCC)   Aspiration pneumonitis (HCC)   Acute respiratory failure with hypoxia (HCC)   Sepsis due to undetermined organism [facility])   Jahrell Hamor is a 88 y.o. y/o male with profound iron deficiency anemia with a positive fit test as an outpatient and was recommended to have an EGD and colonoscopy by his outpatient GI team.  The patient now appears quite ill and frail and is  requiring high flow oxygen.  Plan:  The patient has been explained the risks and benefits of endoscopy which may include an EGD and colonoscopy due to his profound anemia and especially an upper endoscopy due to his coffee-ground emesis.  The patient is hesitant to undergo any endoscopic procedures due to the need for sedation.  The patient states he will think about it.  I have discussed this with the hospitalist and the patient will have a diet started until he decides what he like to do.  The patient has been explained the plan and agrees with it.  Thank you for involving me in the care of this patient.      LOS: 1 day   Rogelia Copping, MD, MD. NOLIA 10/11/2024, 8:18 AM,  Pager [phone] 7am-5pm  Check AMION for 5pm -7am coverage and on weekends   Note: This dictation was prepared with Dragon dictation along with smaller phrase technology. Any transcriptional errors that result from this process are unintentional.

## 2024-10-11 NOTE — ED Notes (Signed)
 This NT assisted Nurse Aleck in changing pt. Pt had a BM. Pt has been cleaned up. Pt has clean chux, clean brief. Pt's mepilex patch was replaced. Barrier cream was applied to pt.

## 2024-10-12 ENCOUNTER — Inpatient Hospital Stay

## 2024-10-12 DIAGNOSIS — K922 Gastrointestinal hemorrhage, unspecified: Secondary | ICD-10-CM | POA: Diagnosis not present

## 2024-10-12 DIAGNOSIS — K920 Hematemesis: Secondary | ICD-10-CM | POA: Diagnosis not present

## 2024-10-12 LAB — CBC
HCT: 27.8 % — ABNORMAL LOW (ref 39.0–52.0)
Hemoglobin: 8.1 g/dL — ABNORMAL LOW (ref 13.0–17.0)
MCH: 25.2 pg — ABNORMAL LOW (ref 26.0–34.0)
MCHC: 29.1 g/dL — ABNORMAL LOW (ref 30.0–36.0)
MCV: 86.3 fL (ref 80.0–100.0)
Platelets: 211 K/uL (ref 150–400)
RBC: 3.22 MIL/uL — ABNORMAL LOW (ref 4.22–5.81)
RDW: 19.8 % — ABNORMAL HIGH (ref 11.5–15.5)
WBC: 12.1 K/uL — ABNORMAL HIGH (ref 4.0–10.5)
nRBC: 0 % (ref 0.0–0.2)

## 2024-10-12 LAB — HEPATIC FUNCTION PANEL
ALT: 75 U/L — ABNORMAL HIGH (ref 0–44)
AST: 221 U/L — ABNORMAL HIGH (ref 15–41)
Albumin: 2.7 g/dL — ABNORMAL LOW (ref 3.5–5.0)
Alkaline Phosphatase: 91 U/L (ref 38–126)
Bilirubin, Direct: 0.2 mg/dL (ref 0.0–0.2)
Indirect Bilirubin: 0.2 mg/dL — ABNORMAL LOW (ref 0.3–0.9)
Total Bilirubin: 0.5 mg/dL (ref 0.0–1.2)
Total Protein: 5.2 g/dL — ABNORMAL LOW (ref 6.5–8.1)

## 2024-10-12 LAB — BASIC METABOLIC PANEL WITH GFR
Anion gap: 19 — ABNORMAL HIGH (ref 5–15)
BUN: 67 mg/dL — ABNORMAL HIGH (ref 8–23)
CO2: 17 mmol/L — ABNORMAL LOW (ref 22–32)
Calcium: 7.3 mg/dL — ABNORMAL LOW (ref 8.9–10.3)
Chloride: 104 mmol/L (ref 98–111)
Creatinine, Ser: 4.73 mg/dL — ABNORMAL HIGH (ref 0.61–1.24)
GFR, Estimated: 11 mL/min — ABNORMAL LOW (ref >60)
Glucose, Bld: 139 mg/dL — ABNORMAL HIGH (ref 70–99)
Potassium: 4.7 mmol/L (ref 3.5–5.1)
Sodium: 141 mmol/L (ref 135–145)

## 2024-10-12 LAB — MAGNESIUM: Magnesium: 2.3 mg/dL (ref 1.7–2.4)

## 2024-10-12 LAB — D-DIMER, QUANTITATIVE: D-Dimer, Quant: 15.76 ug{FEU}/mL — ABNORMAL HIGH (ref 0.00–0.50)

## 2024-10-12 MED ORDER — LACTATED RINGERS IV SOLN: INTRAVENOUS | Status: AC

## 2024-10-12 MED ORDER — TECHNETIUM TO 99M ALBUMIN AGGREGATED
4.4000 | Freq: Once | INTRAVENOUS | Status: AC | PRN
  Administered 2024-10-12: 4.02 via INTRAVENOUS

## 2024-10-12 NOTE — Progress Notes (Signed)
 Patient ID: Ralph Leon, male   DOB: [birth year], 88 y.o.   MRN: [id number]   Collaborated with respiratory therapy to titrate patient down to 4L O2 and he is statting 100%.  [name], RN

## 2024-10-12 NOTE — Evaluation (Signed)
 Physical Therapy Evaluation Patient Details Name: Kharee Lesesne MRN: [id number] DOB: 09/03/1936 Today's Date: 10/12/2024  History of Present Illness  Patient is a 88 year old male with acute onset of coffee-ground emesis with associated melena, possible GI bleed. EFY:RNEI, essential hypertension and CVA  Clinical Impression  Patient is agreeable to PT evaluation. He reports he lives with a friend. He is ambulatory in the home without a device and reports using a rolling walker in the community.  Today the patient requires assistance with mobility. He was able to stand for less than a minute with support. Activity tolerance limited fatigue. No significant dyspnea with activity. He does not appear to be at his baseline. Recommend to continue PT to maximize independence and decrease caregiver burden. Consider rehabilitation < 3 hours/day after this hospital stay.       If plan is discharge home, recommend the following: A little help with bathing/dressing/bathroom;Assistance with cooking/housework;A lot of help with walking and/or transfers   Can travel by private vehicle        Equipment Recommendations None recommended by PT  Recommendations for Other Services       Functional Status Assessment Patient has had a recent decline in their functional status and demonstrates the ability to make significant improvements in function in a reasonable and predictable amount of time.     Precautions / Restrictions Precautions Precautions: Fall Recall of Precautions/Restrictions: Intact Restrictions Weight Bearing Restrictions Per Provider Order: No      Mobility  Bed Mobility Overal bed mobility: Needs Assistance Bed Mobility: Supine to Sit, Sit to Supine     Supine to sit: HOB elevated, Min assist Sit to supine: Mod assist, HOB elevated   General bed mobility comments: cues for technique    Transfers Overall transfer level: Needs assistance Equipment used: 1 person hand held  assist Transfers: Sit to/from Stand Sit to Stand: Min assist           General transfer comment: lifting assistance for standing    Ambulation/Gait               General Gait Details: not attempted  Stairs            Wheelchair Mobility     Tilt Bed    Modified Rankin (Stroke Patients Only)       Balance Overall balance assessment: Needs assistance Sitting-balance support: Feet supported Sitting balance-Leahy Scale: Fair     Standing balance support: Single extremity supported Standing balance-Leahy Scale: Poor Standing balance comment: external support required                             Pertinent Vitals/Pain Pain Assessment Pain Assessment: No/denies pain    Home Living Family/patient expects to be discharged to:: Private residence Living Arrangements: Non-relatives/Friends Available Help at Discharge: Available PRN/intermittently Type of Home: House           Home Equipment: Agricultural Consultant (2 wheels)      Prior Function Prior Level of Function : Independent/Modified Independent;History of Falls (last six months);Patient poor historian/Family not available             Mobility Comments: patient reports no device in the home. rolling walker in the community. at least one fall reported in the past few months ADLs Comments: he reports he is independent with dressing.     Extremity/Trunk Assessment   Upper Extremity Assessment Upper Extremity Assessment: Generalized weakness    Lower  Extremity Assessment Lower Extremity Assessment: Generalized weakness       Communication   Communication Communication: Impaired Factors Affecting Communication: Difficulty expressing self    Cognition Arousal: Alert Behavior During Therapy: WFL for tasks assessed/performed   PT - Cognitive impairments: Difficult to assess Difficult to assess due to: Impaired communication                     PT - Cognition Comments:  very soft spoken and difficult to understand at times Following commands: Impaired Following commands impaired: Only follows one step commands consistently     Cueing Cueing Techniques: Verbal cues     General Comments General comments (skin integrity, edema, etc.): no significant dyspnea with exertion. patient on 40L HHFNC during session    Exercises     Assessment/Plan    PT Assessment Patient needs continued PT services  PT Problem List Decreased strength;Decreased range of motion;Decreased activity tolerance;Decreased balance;Decreased mobility;Decreased safety awareness       PT Treatment Interventions DME instruction;Stair training;Gait training;Functional mobility training;Therapeutic exercise;Therapeutic activities;Balance training;Neuromuscular re-education;Cognitive remediation    PT Goals (Current goals can be found in the Care Plan section)  Acute Rehab PT Goals Patient Stated Goal: none stated PT Goal Formulation: With patient Time For Goal Achievement: 10/26/24 Potential to Achieve Goals: Fair    Frequency Min 2X/week     Co-evaluation               AM-PAC PT 6 Clicks Mobility  Outcome Measure Help needed turning from your back to your side while in a flat bed without using bedrails?: A Little Help needed moving from lying on your back to sitting on the side of a flat bed without using bedrails?: A Lot Help needed moving to and from a bed to a chair (including a wheelchair)?: A Lot Help needed standing up from a chair using your arms (e.g., wheelchair or bedside chair)?: A Lot Help needed to walk in hospital room?: A Lot Help needed climbing 3-5 steps with a railing? : Total 6 Click Score: 12    End of Session   Activity Tolerance: Patient tolerated treatment well Patient left: in bed;with call bell/phone within reach;with bed alarm set Nurse Communication: Mobility status PT Visit Diagnosis: Muscle weakness (generalized) (M62.81);Unsteadiness  on feet (R26.81)    Time: 8953-8894 PT Time Calculation (min) (ACUTE ONLY): 19 min   Charges:   PT Evaluation $PT Eval Moderate Complexity: 1 Mod   PT General Charges $$ ACUTE PT VISIT: 1 Visit        Randine Essex, PT, MPT   Randine LULLA Essex 10/12/2024, 11:48 AM

## 2024-10-12 NOTE — Plan of Care (Signed)
 Patient ID: Ranald Alessio, male   DOB: [birth year], 88 y.o.   MRN: [id number]  Problem: Fluid Volume: Goal: Hemodynamic stability will improve Outcome: Progressing   Problem: Clinical Measurements: Goal: Diagnostic test results will improve Outcome: Progressing Goal: Signs and symptoms of infection will decrease Outcome: Progressing   Problem: Respiratory: Goal: Ability to maintain adequate ventilation will improve Outcome: Progressing   Problem: Education: Goal: Knowledge of General Education information will improve Description: Including pain rating scale, medication(s)/side effects and non-pharmacologic comfort measures Outcome: Progressing   Problem: Health Behavior/Discharge Planning: Goal: Ability to manage health-related needs will improve Outcome: Progressing   Problem: Clinical Measurements: Goal: Ability to maintain clinical measurements within normal limits will improve Outcome: Progressing Goal: Will remain free from infection Outcome: Progressing Goal: Diagnostic test results will improve Outcome: Progressing Goal: Respiratory complications will improve Outcome: Progressing Goal: Cardiovascular complication will be avoided Outcome: Progressing   Problem: Activity: Goal: Risk for activity intolerance will decrease Outcome: Progressing   Problem: Nutrition: Goal: Adequate nutrition will be maintained Outcome: Progressing   Problem: Coping: Goal: Level of anxiety will decrease Outcome: Progressing   Problem: Elimination: Goal: Will not experience complications related to bowel motility Outcome: Progressing Goal: Will not experience complications related to urinary retention Outcome: Progressing   Problem: Pain Managment: Goal: General experience of comfort will improve and/or be controlled Outcome: Progressing   Problem: Safety: Goal: Ability to remain free from injury will improve Outcome: Progressing   Problem: Skin Integrity: Goal: Risk for  impaired skin integrity will decrease Outcome: Progressing     [name], RN

## 2024-10-12 NOTE — TOC Initial Note (Signed)
 Transition of Care [facility]) - Initial/Assessment Note    Patient Details  Name: Ralph Leon MRN: [id number] Date of Birth: Dec 13, 1935  [facility]) CM/SW Contact:    Shasta DELENA Daring, RN Phone Number: 10/12/2024, 4:20 PM  Clinical Narrative:                 RNCM reviewed chart. Has recommendation for SNF. EGD scheduled for tomorrow. Will continue to follow for additional TOC needs.      Patient Goals and CMS Choice            Expected Discharge Plan and Services                                              Prior Living Arrangements/Services                       Activities of Daily Living   ADL Screening (condition at time of admission) Independently performs ADLs?: No Does the patient have a NEW difficulty with bathing/dressing/toileting/self-feeding that is expected to last >3 days?: Yes (Initiates electronic notice to provider for possible OT consult) Does the patient have a NEW difficulty with getting in/out of bed, walking, or climbing stairs that is expected to last >3 days?: Yes (Initiates electronic notice to provider for possible PT consult) Does the patient have a NEW difficulty with communication that is expected to last >3 days?: No Is the patient deaf or have difficulty hearing?: No Does the patient have difficulty seeing, even when wearing glasses/contacts?: No Does the patient have difficulty concentrating, remembering, or making decisions?: No  Permission Sought/Granted                  Emotional Assessment              Admission diagnosis:  Aspiration pneumonitis (HCC) [J69.0] GI bleeding [K92.2] Transaminitis [R74.01] AKI (acute kidney injury) [N17.9] Gastrointestinal hemorrhage with melena [K92.1] Patient Active Problem List   Diagnosis Date Noted   Hematemesis without nausea 10/11/2024   GI bleeding 10/10/2024   Dyslipidemia 10/10/2024   AKI (acute kidney injury) 10/10/2024   Transaminitis 10/10/2024    Dementia with behavioral disturbance (HCC) 10/10/2024   Aspiration pneumonitis (HCC) 10/10/2024   Acute respiratory failure with hypoxia (HCC) 10/10/2024   Sepsis due to undetermined organism (HCC) 10/10/2024   AAA (abdominal aortic aneurysm) 09/21/2024   Normocytic anemia 09/06/2024   Iron deficiency anemia 09/06/2024   Occult blood in stools 09/06/2024   Weight loss 08/14/2024   Urinary symptom or sign 08/07/2024   Vitamin D  deficiency 11/02/2022   Other fatigue 08/31/2022   Stroke (HCC)    Fall 04/04/2021   Aortic atherosclerosis 02/12/2021   Hypercholesteremia 01/20/2021   Coronary artery disease 01/20/2021   IFG (impaired fasting glucose) 07/08/2020   Essential hypertension 03/03/2020   History of CVA (cerebrovascular accident) 03/03/2020   COPD (chronic obstructive pulmonary disease) (HCC)    PCP:  [name], NP Pharmacy:   [facility] [address]), [place] - [address] [place]) [place] [address] Phone: [phone] Fax: [phone]     Social Drivers of Health (SDOH) Social History: SDOH Screenings   Food Insecurity: Patient Declined (10/11/2024)  Housing: Unknown (10/11/2024)  Transportation Needs: Patient Declined (10/11/2024)  Utilities: Not At Risk (10/11/2024)  Alcohol Screen: Low Risk  (  04/19/2024)  Depression (PHQ2-9): Low Risk  (10/02/2024)  Financial Resource Strain: Low Risk  (09/11/2024)   Received from [facility]  Physical Activity: Insufficiently Active (04/19/2024)  Social Connections: Socially Isolated (10/11/2024)  Stress: No Stress Concern Present (04/19/2024)  Tobacco Use: Medium Risk (10/11/2024)  Health Literacy: Adequate Health Literacy (04/19/2024)   SDOH Interventions:     Readmission Risk Interventions     No data to display

## 2024-10-12 NOTE — Plan of Care (Signed)

## 2024-10-12 NOTE — Progress Notes (Signed)
    Rogelia Copping, MD [facility]   [address] [place], [place] [address] Phone: [phone] Fax : [phone]   Subjective: The patient is sitting up in bed comfortably without any complaints.  His blood count continues to drop with his hemoglobin of 9.3 down to 8.1 today.  The patient was spoken to yesterday about proceeding with an endoluminal evaluation that was recommended by his outpatient gastroenterologist and by myself.   Objective: Vital signs in last 24 hours: Vitals:   10/12/24 0006 10/12/24 0351 10/12/24 0506 10/12/24 0749  BP: 133/62  (!) 137/53 (!) 126/54  Pulse: 85  74 78  Resp: 19  20 20   Temp: 98 F (36.7 C)  97.7 F (36.5 C) 97.8 F (36.6 C)  TempSrc:      SpO2: 100% 100% 100% 100%  Weight:      Height:       Weight change:   Intake/Output Summary (Last 24 hours) at 10/12/2024 9167 Last data filed at 10/12/2024 9682 Gross per 24 hour  Intake 3744.21 ml  Output --  Net 3744.21 ml     Exam: General: The patient is sitting up in bed with a high flow nasal cannula in no apparent distress   Lab Results: @LABTEST2 @ Micro Results: No results found for this or any previous visit (from the past 240 hours). Studies/Results: DG Chest 1 View Result Date: 10/10/2024 CLINICAL DATA:  Possible aspiration. EXAM: CHEST  1 VIEW COMPARISON:  Chest CT 03/08/2020 FINDINGS: The heart size and mediastinal contours are within normal limits. There is likely linear external artifact overlying the right upper chest. There is no focal lung infiltrate, pleural effusion or pneumothorax. The visualized skeletal structures are unremarkable. IMPRESSION: No active disease. Electronically Signed   By: Greig Pique M.D.   On: 10/10/2024 22:16   Medications: I have reviewed the patient's current medications. Scheduled Meds:  donepezil  5 mg Oral QHS   pantoprazole (PROTONIX) IV  40 mg Intravenous Q12H   Continuous Infusions:  lactated ringers 100 mL/hr at 10/12/24 0411    octreotide (SANDOSTATIN) 500 mcg in sodium chloride 0.9 % 250 mL (2 mcg/mL) infusion 50 mcg/hr (10/12/24 0317)   PRN Meds:.acetaminophen **OR** acetaminophen, magnesium hydroxide, ondansetron **OR** ondansetron (ZOFRAN) IV, traZODone   Assessment: Principal Problem:   GI bleeding Active Problems:   Essential hypertension   Dyslipidemia   AKI (acute kidney injury)   Transaminitis   Dementia with behavioral disturbance (HCC)   Aspiration pneumonitis (HCC)   Acute respiratory failure with hypoxia (HCC)   Sepsis due to undetermined organism (HCC)   Hematemesis without nausea    Plan: I have spoken to the patient again about proceeding with a luminal evaluation and he states that he has had a colonoscopy in the past and he does not want another colonoscopy.  He was told that his blood count continues to drop and he may be bleeding somewhere.  He agrees to proceeding with a upper endoscopy but he refuses to have it today because he wants to eat something.  I have told him we can set it up for tomorrow and he is amenable to that.  The patient will be set up for an EGD for tomorrow.   LOS: 2 days   Rogelia Copping, MD.FACG 10/12/2024, 8:32 AM Pager [phone] 7am-5pm  Check AMION for 5pm -7am coverage and on weekends

## 2024-10-12 NOTE — Evaluation (Signed)
 Clinical/Bedside Swallow Evaluation Patient Details  Name: Ralph Leon MRN: [id number] Date of Birth: 08/06/1936  Today's Date: 10/12/2024 Time: SLP Start Time (ACUTE ONLY): 0905 SLP Stop Time (ACUTE ONLY): 0920 SLP Time Calculation (min) (ACUTE ONLY): 15 min  Past Medical History:  Past Medical History:  Diagnosis Date   COPD (chronic obstructive pulmonary disease) (HCC)    Hypertension    Stroke [facility])    Past Surgical History: No past surgical history on file. HPI:  Per H&P, Ralph Leon is a 88 y.o. patient male with medical history significant for COPD, essential hypertension and CVA, presented to the emergency room with acute onset of coffee-ground emesis with associated melena once today.  And not being on any blood thinners.  He has been feeling weak for the past day.  Denied any abdominal pain but has been having diarrhea and stools have been mostly brown.  He had coffee-ground emesis twice.  He used to drink alcohol in the past.  No chest pain or shortness of breath or cough or wheezing or hemoptysis.  No dysuria, oliguria or hematuria or flank pain.  No other bleeding diathesis.  He has been recommended GI workup before but was hesitant to have it done.  He has been following with oncology for possible underlying malignancy when he was recommended GI workup. Tentative plan for EGD, 10/13/24. CXR on admit, negative for acute findings.    Assessment / Plan / Recommendation  Clinical Impression  Pt seen for clincial swallowing evaluation. Pt alert, cooperative. On 40L/min O2 via [place]. Speech is mildly hoarse/hypophonic. Missing dentition noted. Pt demonstrated grossly functional oropharyngeal swallow function per clinical assessment c/b mildly prolonged, but functional, mastication of solids. Pharyngeal swallow appeared [facility] with no overt s/sx pharyngeal dysphagia. Pt denies s/sx oropharyngeal dysphagia. Recommend continuation of current diet with standard aspiration precautions. No  f/u ST warranted at this time. Please reconsult should additional oropharyngeal swallow concerns arise. SLP Visit Diagnosis: Dysphagia, unspecified (R13.10)    Aspiration Risk  Mild aspiration risk    Diet Recommendation  (continue current diet)    Liquid Administration via: Cup;Spoon;Straw Medication Administration:  (as tolerated) Supervision: Patient able to self feed (set up) Compensations: Slow rate;Small sips/bites;Follow solids with liquid Postural Changes: Seated upright at 90 degrees;Remain upright for at least 30 minutes after po intake    Other  Recommendations Recommended Consults:  (GI following; tenative plan for EGD next date) Oral Care Recommendations: Oral care BID (set up)        Functional Status Assessment Patient has not had a recent decline in their functional status         Prognosis Prognosis for improved oropharyngeal function: Good      Swallow Study   General Date of Onset: 10/10/24 HPI: Per H&P, Ralph Leon is a 88 y.o. patient male with medical history significant for COPD, essential hypertension and CVA, presented to the emergency room with acute onset of coffee-ground emesis with associated melena once today.  And not being on any blood thinners.  He has been feeling weak for the past day.  Denied any abdominal pain but has been having diarrhea and stools have been mostly brown.  He had coffee-ground emesis twice.  He used to drink alcohol in the past.  No chest pain or shortness of breath or cough or wheezing or hemoptysis.  No dysuria, oliguria or hematuria or flank pain.  No other bleeding diathesis.  He has been recommended GI workup before but was hesitant to have it  done.  He has been following with oncology for possible underlying malignancy when he was recommended GI workup. Tentative plan for EGD, 10/13/24. CXR on admit, negative for acute findings. Type of Study: Bedside Swallow Evaluation Previous Swallow Assessment: none Diet Prior to  this Study: NPO (GI soft, thin) Temperature Spikes Noted: No (WBC 12.1, trending downward) Respiratory Status:  (40L/min via HFNC) History of Recent Intubation: No Behavior/Cognition: Alert;Cooperative Oral Cavity Assessment: Within Functional Limits Oral Care Completed by SLP: Yes Oral Cavity - Dentition: Missing dentition Vision: Functional for self-feeding Self-Feeding Abilities: Able to feed self;Needs set up Patient Positioning: Upright in bed Baseline Vocal Quality: Low vocal intensity;Hoarse (mild dysphonia) Volitional Cough:  (seemingly adequate) Volitional Swallow: Able to elicit    Oral/Motor/Sensory Function Overall Oral Motor/Sensory Function: Within functional limits (no functional deficits noted)   Ice Chips Ice chips: Not tested   Thin Liquid Thin Liquid: Within functional limits Presentation: Self Fed;Straw    Nectar Thick Nectar Thick Liquid: Not tested   Honey Thick Honey Thick Liquid: Not tested  Puree Puree: Within functional limits Presentation: Spoon;Self Fed   Solid     Solid: Within functional limits (mildly prolonged, but functional, mastication) Presentation: Self Fed     Delon Bangs, M.S., CCC-SLP Speech-Language Pathologist [place] [facility] [phone] (ASCOM)  Delon HERO Keyshia Orwick 10/12/2024,9:49 AM

## 2024-10-12 NOTE — Progress Notes (Signed)
 PROGRESS NOTE    Darvin Dials  FMW:[id number] DOB: 05-10-1936 DOA: 10/10/2024 PCP: [name], NP  252A/[id number]-AA  LOS: 3 days   Brief hospital course:   Assessment & Plan: Ralph Leon is a 88 y.o. patient male with medical history significant for COPD, essential hypertension and CVA, presented to the emergency room with acute onset of coffee-ground emesis with associated melena once today.  And not being on any blood thinners.  He has been feeling weak for the past day.  Denied any abdominal pain but has been having diarrhea and stools have been mostly brown.  He had coffee-ground emesis twice.  He used to drink alcohol in the past.  No chest pain or shortness of breath or cough or wheezing or hemoptysis.  No dysuria, oliguria or hematuria or flank pain.  No other bleeding diathesis.  He has been recommended GI workup before but was hesitant to have it done.  He has been following with oncology for possible underlying malignancy when he was recommended GI workup.    * Likely upper GI bleeding --coffee-ground emesis and anemia --GI consulted.  Pt was on heated hf, so procedures held. --monitor Hgb --cont IV PPI BID --possible EGD tomorrow  Acute respiratory failure with hypoxia (HCC) --after presentation, pt became hypoxic and needed heated hf.  CXR on presentation wnl.  Unclear etiology, thought thought to be due to aspiration?  Thought pt denied aspiration events. --down to 4L today --Continue supplemental O2 to keep sats >=90%, wean as tolerated  Transaminitis - The patient has an AST:ALT ratio higher than 5: 1, strongly suggesting alcoholic liver disease and possibly alcoholic hepatitis being new compared to last month. - Will obtain viral hepatitis panel. - We will follow LFTs with hydration. - This could be part of hepatorenal syndrome  AKI (acute kidney injury) - likely secondary to significant dehydration from recurrent nausea and vomiting and anorexia. --cont  MIVF  Sepsis, ruled out  Dyslipidemia - Will hold off statin therapy given his elevated transaminases.  Dementia with behavioral disturbance (HCC) - Will continue Aricept .  Essential hypertension Will hold off diuretic therapy given AKI and GI bleeding as well as lisinopril  given AKI. - Will be placed on as needed IV labetalol.  Hx of alcohol abuse --pt denied recent use  Elevated D-dimer --15.76.   --VQ scan    DVT prophylaxis: SCD/Compression stockings Code Status: DNR  Family Communication:  Level of care: Med-Surg Dispo:   The patient is from: home Anticipated d/c is to: home Anticipated d/c date is: 2-3 days   Subjective and Interval History:  No new complaint today.   Objective: Vitals:   10/12/24 2124 10/12/24 2346 10/13/24 0506 10/13/24 0833  BP:  126/67 126/66 (!) 129/50  Pulse:  73 76 79  Resp:  20 20 16   Temp:  98.2 F (36.8 C) (!) 97.4 F (36.3 C)   TempSrc:      SpO2: 99% 96% 98% 100%  Weight:      Height:        Intake/Output Summary (Last 24 hours) at 10/13/2024 0901 Last data filed at 10/13/2024 0600 Gross per 24 hour  Intake 810.58 ml  Output 200 ml  Net 610.58 ml   Filed Weights   10/10/24 2028  Weight: 63.5 kg    Examination:   Constitutional: NAD, alert, oriented HEENT: conjunctivae and lids normal, EOMI CV: No cyanosis.   RESP: normal respiratory effort, on 4L Neuro: II - XII grossly intact.   Psych: Normal  mood and affect.  Appropriate judgement and reason   Data Reviewed: I have personally reviewed labs and imaging studies  Time spent: 50 minutes  Ellouise Haber, MD [facility] If 7PM-7AM, please contact night-coverage 10/13/2024, 9:01 AM

## 2024-10-12 NOTE — Care Management Important Message (Signed)
 Important Message  Patient Details  Name: Ralph Leon MRN: [id number] Date of Birth: June 18, 1936   Important Message Given:  Yes - Medicare IM     [name] 10/12/2024, 3:19 PM

## 2024-10-13 ENCOUNTER — Inpatient Hospital Stay: Admitting: Anesthesiology

## 2024-10-13 ENCOUNTER — Encounter
Admission: EM | Disposition: A | Discharge status: Skilled Nursing Facility | Payer: Self-pay | Source: Home / Self Care | Attending: Hospitalist

## 2024-10-13 ENCOUNTER — Encounter: Payer: Self-pay | Admitting: Family Medicine

## 2024-10-13 DIAGNOSIS — K922 Gastrointestinal hemorrhage, unspecified: Secondary | ICD-10-CM | POA: Diagnosis not present

## 2024-10-13 HISTORY — PX: ESOPHAGOGASTRODUODENOSCOPY: SHX5428

## 2024-10-13 LAB — HEPATIC FUNCTION PANEL
ALT: 66 U/L — ABNORMAL HIGH (ref 0–44)
AST: 136 U/L — ABNORMAL HIGH (ref 15–41)
Albumin: 2.7 g/dL — ABNORMAL LOW (ref 3.5–5.0)
Alkaline Phosphatase: 84 U/L (ref 38–126)
Bilirubin, Direct: 0.2 mg/dL (ref 0.0–0.2)
Indirect Bilirubin: 0.2 mg/dL — ABNORMAL LOW (ref 0.3–0.9)
Total Bilirubin: 0.4 mg/dL (ref 0.0–1.2)
Total Protein: 5 g/dL — ABNORMAL LOW (ref 6.5–8.1)

## 2024-10-13 LAB — CBC
HCT: 26 % — ABNORMAL LOW (ref 39.0–52.0)
Hemoglobin: 7.8 g/dL — ABNORMAL LOW (ref 13.0–17.0)
MCH: 24.9 pg — ABNORMAL LOW (ref 26.0–34.0)
MCHC: 30 g/dL (ref 30.0–36.0)
MCV: 83.1 fL (ref 80.0–100.0)
Platelets: 229 K/uL (ref 150–400)
RBC: 3.13 MIL/uL — ABNORMAL LOW (ref 4.22–5.81)
RDW: 19.7 % — ABNORMAL HIGH (ref 11.5–15.5)
WBC: 10.8 K/uL — ABNORMAL HIGH (ref 4.0–10.5)
nRBC: 0 % (ref 0.0–0.2)

## 2024-10-13 LAB — BASIC METABOLIC PANEL WITH GFR
Anion gap: 12 (ref 5–15)
BUN: 73 mg/dL — ABNORMAL HIGH (ref 8–23)
CO2: 20 mmol/L — ABNORMAL LOW (ref 22–32)
Calcium: 7.2 mg/dL — ABNORMAL LOW (ref 8.9–10.3)
Chloride: 110 mmol/L (ref 98–111)
Creatinine, Ser: 4.75 mg/dL — ABNORMAL HIGH (ref 0.61–1.24)
GFR, Estimated: 11 mL/min — ABNORMAL LOW (ref >60)
Glucose, Bld: 95 mg/dL (ref 70–99)
Potassium: 3.9 mmol/L (ref 3.5–5.1)
Sodium: 142 mmol/L (ref 135–145)

## 2024-10-13 LAB — MAGNESIUM: Magnesium: 2.2 mg/dL (ref 1.7–2.4)

## 2024-10-13 SURGERY — EGD (ESOPHAGOGASTRODUODENOSCOPY)
Anesthesia: General

## 2024-10-13 MED ORDER — PROPOFOL 10 MG/ML IV BOLUS
INTRAVENOUS | Status: DC | PRN
  Administered 2024-10-13: 100 mg via INTRAVENOUS

## 2024-10-13 MED ORDER — PANTOPRAZOLE SODIUM 40 MG PO TBEC
40.0000 mg | DELAYED_RELEASE_TABLET | Freq: Every day | ORAL | Status: DC
  Administered 2024-10-14 – 2024-10-22 (×9): 40 mg via ORAL
  Filled 2024-10-13 (×9): qty 1

## 2024-10-13 MED ORDER — LIDOCAINE HCL (CARDIAC) PF 100 MG/5ML IV SOSY
PREFILLED_SYRINGE | INTRAVENOUS | Status: DC | PRN
  Administered 2024-10-13: 80 mg via INTRAVENOUS

## 2024-10-13 MED ORDER — SODIUM CHLORIDE 0.9 % IV SOLN: INTRAVENOUS | Status: DC

## 2024-10-13 NOTE — Anesthesia Postprocedure Evaluation (Signed)
 Anesthesia Post Note  Patient: Ralph Leon  Procedure(s) Performed: EGD (ESOPHAGOGASTRODUODENOSCOPY)  Patient location during evaluation: Endoscopy Anesthesia Type: General Level of consciousness: awake and alert Pain management: pain level controlled Vital Signs Assessment: post-procedure vital signs reviewed and stable Respiratory status: spontaneous breathing, nonlabored ventilation, respiratory function stable and patient connected to nasal cannula oxygen Cardiovascular status: blood pressure returned to baseline and stable Postop Assessment: no apparent nausea or vomiting Anesthetic complications: no   No notable events documented.   Last Vitals:  Vitals:   10/13/24 1141 10/13/24 1228  BP: (!) 121/56 (!) 124/51  Pulse: 72 77  Resp: 19 17  Temp:  36.4 C  SpO2: 94% 99%    Last Pain:  Vitals:   10/13/24 1141  TempSrc:   PainSc: 0-No pain                 Prentice Murphy

## 2024-10-13 NOTE — Plan of Care (Signed)
   Problem: Clinical Measurements: Goal: Diagnostic test results will improve Outcome: Progressing

## 2024-10-13 NOTE — Plan of Care (Signed)

## 2024-10-13 NOTE — Progress Notes (Signed)
 Patient transferred via bed to [facility].

## 2024-10-13 NOTE — Transfer of Care (Signed)
 Immediate Anesthesia Transfer of Care Note  Patient: Ralph Leon  Procedure(s) Performed: EGD (ESOPHAGOGASTRODUODENOSCOPY)  Patient Location: Endoscopy Unit  Anesthesia Type:General  Level of Consciousness: drowsy  Airway & Oxygen Therapy: Patient Spontanous Breathing and Patient connected to nasal cannula oxygen  Post-op Assessment: Report given to RN, Post -op Vital signs reviewed and stable, and Patient moving all extremities  Post vital signs: Reviewed and stable  Last Vitals:  Vitals Value Taken Time  BP 86/48 10/13/24 11:01  Temp    Pulse 74 10/13/24 11:01  Resp 18 10/13/24 11:01  SpO2 88 % 10/13/24 11:01  Vitals shown include unfiled device data.  Last Pain:  Vitals:   10/13/24 1052  TempSrc:   PainSc: Asleep         Complications: No notable events documented.

## 2024-10-13 NOTE — Anesthesia Preprocedure Evaluation (Signed)
 Anesthesia Evaluation  Patient identified by MRN, date of birth, ID band Patient awake    Reviewed: Allergy & Precautions, H&P , NPO status , Patient's Chart, lab work & pertinent test results, reviewed documented beta blocker date and time   History of Anesthesia Complications Negative for: history of anesthetic complications  Airway Mallampati: II  TM Distance: >3 FB Neck ROM: full    Dental  (+) Dental Advidsory Given, Missing, Poor Dentition, Chipped   Pulmonary shortness of breath and with exertion, COPD, neg recent URI, former smoker   Pulmonary exam normal breath sounds clear to auscultation       Cardiovascular Exercise Tolerance: Good hypertension, (-) angina + CAD  (-) Past MI and (-) Cardiac Stents Normal cardiovascular exam(-) dysrhythmias (-) Valvular Problems/Murmurs Rhythm:regular Rate:Normal     Neuro/Psych neg Seizures PSYCHIATRIC DISORDERS     Dementia CVA    GI/Hepatic negative GI ROS, Neg liver ROS,,,  Endo/Other  negative endocrine ROS    Renal/GU negative Renal ROS  negative genitourinary   Musculoskeletal   Abdominal   Peds  Hematology negative hematology ROS (+)   Anesthesia Other Findings Past Medical History: No date: COPD (chronic obstructive pulmonary disease) (HCC) No date: Hypertension No date: Stroke (HCC)   Reproductive/Obstetrics negative OB ROS                              Anesthesia Physical Anesthesia Plan  ASA: 3  Anesthesia Plan: General   Post-op Pain Management:    Induction: Intravenous  PONV Risk Score and Plan: 2 and Propofol  infusion and TIVA  Airway Management Planned: Natural Airway and Nasal Cannula  Additional Equipment:   Intra-op Plan:   Post-operative Plan:   Informed Consent: I have reviewed the patients History and Physical, chart, labs and discussed the procedure including the risks, benefits and alternatives for the  proposed anesthesia with the patient or authorized representative who has indicated his/her understanding and acceptance.     Dental Advisory Given  Plan Discussed with: Anesthesiologist, CRNA and Surgeon  Anesthesia Plan Comments:          Anesthesia Quick Evaluation

## 2024-10-13 NOTE — Progress Notes (Signed)
 PROGRESS NOTE    Ralph Leon  FMW:[id number] DOB: 03-Jun-1936 DOA: 10/10/2024 PCP: [name], NP  224A/[id number]-AA  LOS: 3 days   Brief hospital course:   Assessment & Plan: Ralph Leon is a 88 y.o. patient male with medical history significant for COPD, essential hypertension and CVA, presented to the emergency room with acute onset of coffee-ground emesis with associated melena once today.  And not being on any blood thinners.  He has been feeling weak for the past day.  Denied any abdominal pain but has been having diarrhea and stools have been mostly brown.  He had coffee-ground emesis twice.  He used to drink alcohol in the past.  No chest pain or shortness of breath or cough or wheezing or hemoptysis.  No dysuria, oliguria or hematuria or flank pain.  No other bleeding diathesis.  He has been recommended GI workup before but was hesitant to have it done.  He has been following with oncology for possible underlying malignancy when he was recommended GI workup.    * Likely upper GI bleeding --coffee-ground emesis and anemia --GI consulted.   --EGD today showed esophagitis and Erosive gastropathy.  Pt refused colonoscopy. --switch from IV PPI BID to oral protonix  daily --outpatient GI f/u  Acute respiratory failure with hypoxia (HCC) --after presentation, pt became hypoxic and needed heated hf.  CXR on presentation wnl.  Unclear etiology, thought thought to be due to aspiration?  Thought pt denied aspiration events. --down to 3L today --Continue supplemental O2 to keep sats >=90%, wean as tolerated  Transaminitis - The patient has an AST:ALT ratio higher than 5: 1, strongly suggesting alcoholic liver disease and possibly alcoholic hepatitis being new compared to last month. - Will obtain viral hepatitis panel. - We will follow LFTs with hydration. - This could be part of hepatorenal syndrome  AKI (acute kidney injury) - likely secondary to significant dehydration from  recurrent nausea and vomiting and anorexia. --cont MIVF  Sepsis, ruled out  Dyslipidemia - Will hold off statin therapy given his elevated transaminases.  Dementia with behavioral disturbance (HCC) --cont Aricept   Essential hypertension Will hold off diuretic therapy given AKI and GI bleeding as well as lisinopril  given AKI. - Will be placed on as needed IV labetalol.  Hx of alcohol abuse --pt denied recent use  Elevated D-dimer --15.76.   --VQ scan neg for PE   DVT prophylaxis: SCD/Compression stockings Code Status: DNR  Family Communication: friend updated at bedside today Level of care: Med-Surg Dispo:   The patient is from: home Anticipated d/c is to: home Anticipated d/c date is: 2-3 days   Subjective and Interval History:  No diarrhea since presentation.  Pt requested softer or liquid foods.  EGD today showed esophagitis and Erosive gastropathy.   Objective: Vitals:   10/13/24 1228 10/13/24 1729 10/13/24 1853 10/13/24 2021  BP: (!) 124/51 (!) 125/59 139/61 136/70  Pulse: 77 75 86 77  Resp: 17 17 16 20   Temp: 97.6 F (36.4 C) 98.4 F (36.9 C) 98.2 F (36.8 C) 98.1 F (36.7 C)  TempSrc:   Oral Oral  SpO2: 99% 100% 100% 100%  Weight:      Height:        Intake/Output Summary (Last 24 hours) at 10/13/2024 2111 Last data filed at 10/13/2024 1710 Gross per 24 hour  Intake 1089.31 ml  Output 750 ml  Net 339.31 ml   Filed Weights   10/10/24 2028 10/13/24 0957  Weight: 63.5 kg 63.5 kg  Examination:   Constitutional: NAD, AAOx3 HEENT: conjunctivae and lids normal, EOMI CV: No cyanosis.   RESP: normal respiratory effort Neuro: II - XII grossly intact.   Psych: Normal mood and affect.  Appropriate judgement and reason   Data Reviewed: I have personally reviewed labs and imaging studies  Time spent: 35 minutes  Ellouise Haber, MD [facility] If 7PM-7AM, please contact night-coverage 10/13/2024, 9:11 PM

## 2024-10-13 NOTE — Op Note (Signed)
 [facility] Gastroenterology Patient Name: Ralph Leon Procedure Date: 10/13/2024 10:33 AM MRN: [id number] Account #: [id number] Date of Birth: 1936/03/08 Admit Type: Inpatient Age: 88 Room: [facility] ENDO ROOM 1 Gender: Male Note Status: Finalized Instrument Name: Endoscope [id number] Procedure:             Upper GI endoscopy Indications:           Iron  deficiency anemia Providers:             Rogelia Copping MD, MD Referring MD:          Darice Petty (Referring MD) Medicines:             Propofol  per Anesthesia Complications:         No immediate complications. Procedure:             Pre-Anesthesia Assessment:                        - Prior to the procedure, a History and Physical was                         performed, and patient medications and allergies were                         reviewed. The patient's tolerance of previous                         anesthesia was also reviewed. The risks and benefits                         of the procedure and the sedation options and risks                         were discussed with the patient. All questions were                         answered, and informed consent was obtained. Prior                         Anticoagulants: The patient has taken no anticoagulant                         or antiplatelet agents. ASA Grade Assessment: II - A                         patient with mild systemic disease. After reviewing                         the risks and benefits, the patient was deemed in                         satisfactory condition to undergo the procedure.                        After obtaining informed consent, the endoscope was                         passed under direct vision. Throughout the procedure,  the patient's blood pressure, pulse, and oxygen                         saturations were monitored continuously. The Endoscope                         was introduced through the mouth, and  advanced to the                         second part of duodenum. The upper GI endoscopy was                         accomplished without difficulty. The patient tolerated                         the procedure well. Findings:      LA Grade A (one or more mucosal breaks less than 5 mm, not extending       between tops of 2 mucosal folds) esophagitis with no bleeding was found       at the gastroesophageal junction.      A few erosions with no bleeding and no stigmata of recent bleeding were       found in the gastric body.      Localized mildly erythematous mucosa without active bleeding and with no       stigmata of bleeding was found in the duodenal bulb. Impression:            - LA Grade A reflux esophagitis with no bleeding.                        - Erosive gastropathy with no bleeding and no stigmata                         of recent bleeding.                        - Erythematous duodenopathy.                        - No specimens collected. Recommendation:        - Return patient to hospital ward for ongoing care.                        - Resume regular diet.                        - Continue present medications.                        - The patient has refused any further GI workup at                         this time but should have a colonoscopy in the future.                        He can follow up with his GI doc at [facility].                        - Use a proton pump  inhibitor PO daily. Procedure Code(s):     --- Professional ---                        [phone], Esophagogastroduodenoscopy, flexible,                         transoral; diagnostic, including collection of                         specimen(s) by brushing or washing, when performed                         (separate procedure) Diagnosis Code(s):     --- Professional ---                        D50.9, Iron  deficiency anemia, unspecified                        K21.00, Gastro-esophageal reflux disease with                          esophagitis, without bleeding CPT copyright 2022 American Medical Association. All rights reserved. The codes documented in this report are preliminary and upon coder review may  be revised to meet current compliance requirements. Rogelia Copping MD, MD 10/13/2024 10:56:52 AM This report has been signed electronically. Number of Addenda: 0 Note Initiated On: 10/13/2024 10:33 AM Estimated Blood Loss:  Estimated blood loss: none.      [facility]

## 2024-10-14 DIAGNOSIS — K922 Gastrointestinal hemorrhage, unspecified: Secondary | ICD-10-CM | POA: Diagnosis not present

## 2024-10-14 LAB — HEPATIC FUNCTION PANEL
ALT: 61 U/L — ABNORMAL HIGH (ref 0–44)
AST: 97 U/L — ABNORMAL HIGH (ref 15–41)
Albumin: 2.7 g/dL — ABNORMAL LOW (ref 3.5–5.0)
Alkaline Phosphatase: 104 U/L (ref 38–126)
Bilirubin, Direct: 0.2 mg/dL (ref 0.0–0.2)
Indirect Bilirubin: 0.2 mg/dL — ABNORMAL LOW (ref 0.3–0.9)
Total Bilirubin: 0.4 mg/dL (ref 0.0–1.2)
Total Protein: 4.9 g/dL — ABNORMAL LOW (ref 6.5–8.1)

## 2024-10-14 LAB — BASIC METABOLIC PANEL WITH GFR
Anion gap: 13 (ref 5–15)
BUN: 72 mg/dL — ABNORMAL HIGH (ref 8–23)
CO2: 21 mmol/L — ABNORMAL LOW (ref 22–32)
Calcium: 7.5 mg/dL — ABNORMAL LOW (ref 8.9–10.3)
Chloride: 110 mmol/L (ref 98–111)
Creatinine, Ser: 4.48 mg/dL — ABNORMAL HIGH (ref 0.61–1.24)
GFR, Estimated: 12 mL/min — ABNORMAL LOW (ref >60)
Glucose, Bld: 112 mg/dL — ABNORMAL HIGH (ref 70–99)
Potassium: 3.8 mmol/L (ref 3.5–5.1)
Sodium: 145 mmol/L (ref 135–145)

## 2024-10-14 LAB — CBC
HCT: 26.9 % — ABNORMAL LOW (ref 39.0–52.0)
Hemoglobin: 7.8 g/dL — ABNORMAL LOW (ref 13.0–17.0)
MCH: 24.4 pg — ABNORMAL LOW (ref 26.0–34.0)
MCHC: 29 g/dL — ABNORMAL LOW (ref 30.0–36.0)
MCV: 84.1 fL (ref 80.0–100.0)
Platelets: 182 K/uL (ref 150–400)
RBC: 3.2 MIL/uL — ABNORMAL LOW (ref 4.22–5.81)
RDW: 19.4 % — ABNORMAL HIGH (ref 11.5–15.5)
WBC: 6.3 K/uL (ref 4.0–10.5)
nRBC: 0 % (ref 0.0–0.2)

## 2024-10-14 MED ORDER — LACTATED RINGERS IV SOLN: INTRAVENOUS | Status: DC

## 2024-10-14 NOTE — TOC Progression Note (Addendum)
 Transition of Care [facility]) - Progression Note    Patient Details  Name: Ralph Leon MRN: [id number] Date of Birth: [birth year]  Transition of Care [facility]) CM/SW Contact  Anthoni Geerts E Reyna Lorenzi, LCSW Phone Number: 10/14/2024, 12:46 PM  Clinical Narrative:    Attempted to speak with patient on room phone regarding SNF rec for STR. Unable to understand patient. Will ask ICM to follow up at bedside.   Called patient's friend Orlean, she is agreeable to SNF for STR. Prefers [facility].   SNF work up started.                    Expected Discharge Plan and Services                                               Social Drivers of Health (SDOH) Interventions SDOH Screenings   Food Insecurity: Patient Declined (10/11/2024)  Housing: Unknown (10/11/2024)  Transportation Needs: Patient Declined (10/11/2024)  Utilities: Not At Risk (10/11/2024)  Alcohol Screen: Low Risk  (04/19/2024)  Depression (PHQ2-9): Low Risk  (10/02/2024)  Financial Resource Strain: Low Risk  (09/11/2024)   Received from [facility]  Physical Activity: Insufficiently Active (04/19/2024)  Social Connections: Socially Isolated (10/11/2024)  Stress: No Stress Concern Present (04/19/2024)  Tobacco Use: Medium Risk (10/13/2024)  Health Literacy: Adequate Health Literacy (04/19/2024)    Readmission Risk Interventions     No data to display

## 2024-10-14 NOTE — Plan of Care (Signed)
   Problem: Fluid Volume: Goal: Hemodynamic stability will improve Outcome: Progressing

## 2024-10-14 NOTE — NC FL2 (Signed)
 [place]  MEDICAID FL2 LEVEL OF CARE FORM     IDENTIFICATION  Patient Name: Ralph Leon Birthdate: Mar 27, 1936 Sex: male Admission Date (Current Location): 10/10/2024  [facility] and [place] Number:  Chiropodist and Address:  [facility], [address] [place], [place] [address]      Provider Number: [id number]  Attending Physician Name and Address:  Awanda City, MD  Relative Name and Phone Number:  PATTERSON,SHIRLEY ETTERFriend)  [phone] (Mobile)    Current Level of Care: Hospital Recommended Level of Care: Skilled Nursing Facility Prior Approval Number:    Date Approved/Denied:   PASRR Number: [id number]  Discharge Plan:      Current Diagnoses: Patient Active Problem List   Diagnosis Date Noted   Hematemesis without nausea 10/11/2024   GI bleeding 10/10/2024   Dyslipidemia 10/10/2024   AKI (acute kidney injury) 10/10/2024   Transaminitis 10/10/2024   Dementia with behavioral disturbance (HCC) 10/10/2024   Aspiration pneumonitis (HCC) 10/10/2024   Acute respiratory failure with hypoxia (HCC) 10/10/2024   Sepsis due to undetermined organism (HCC) 10/10/2024   AAA (abdominal aortic aneurysm) 09/21/2024   Normocytic anemia 09/06/2024   Iron  deficiency anemia 09/06/2024   Occult blood in stools 09/06/2024   Weight loss 08/14/2024   Urinary symptom or sign 08/07/2024   Vitamin D  deficiency 11/02/2022   Other fatigue 08/31/2022   Stroke (HCC)    Fall 04/04/2021   Aortic atherosclerosis 02/12/2021   Hypercholesteremia 01/20/2021   Coronary artery disease 01/20/2021   IFG (impaired fasting glucose) 07/08/2020   Essential hypertension 03/03/2020   History of CVA (cerebrovascular accident) 03/03/2020   COPD (chronic obstructive pulmonary disease) (HCC)     Orientation RESPIRATION BLADDER Height & Weight     Self, Time, Situation, Place  O2 (2L) Incontinent, External catheter Weight: 139 lb 15.9 oz (63.5 kg) Height:  5' 4  (162.6 cm)  BEHAVIORAL SYMPTOMS/MOOD NEUROLOGICAL BOWEL NUTRITION STATUS      Incontinent Diet (dysphagia 2)  AMBULATORY STATUS COMMUNICATION OF NEEDS Skin   Limited Assist Verbally Other (Comment) (stage 1 - sacrum - foam dressing ; redness)                       Personal Care Assistance Level of Assistance  Bathing, Feeding, Dressing Bathing Assistance: Limited assistance Feeding assistance: Limited assistance Dressing Assistance: Limited assistance     Functional Limitations Info  Sight, Hearing Sight Info: Impaired Hearing Info: Impaired      SPECIAL CARE FACTORS FREQUENCY  PT (By licensed PT), OT (By licensed OT)     PT Frequency: 5 times per week OT Frequency: 5 times per week            Contractures      Additional Factors Info  Code Status, Allergies Code Status Info: Limited: Do not attempt resuscitation (DNR) -DNR-LIMITED -Do Not Intubate/DNI Allergies Info: nka           Current Medications (10/14/2024):  This is the current hospital active medication list Current Facility-Administered Medications  Medication Dose Route Frequency Provider Last Rate Last Admin   acetaminophen  (TYLENOL ) tablet 650 mg  650 mg Oral Q6H PRN Jinny Carmine, MD       Or   acetaminophen  (TYLENOL ) suppository 650 mg  650 mg Rectal Q6H PRN Jinny Carmine, MD       donepezil  (ARICEPT ) tablet 5 mg  5 mg Oral QHS Jinny Carmine, MD   5 mg at 10/13/24 2130   lactated  ringers  infusion   Intravenous Continuous Awanda City, MD 50 mL/hr at 10/14/24 1241 New Bag at 10/14/24 1241   magnesium  hydroxide (MILK OF MAGNESIA) suspension 30 mL  30 mL Oral Daily PRN Jinny Carmine, MD       ondansetron  (ZOFRAN ) tablet 4 mg  4 mg Oral Q6H PRN Jinny Carmine, MD       Or   ondansetron  (ZOFRAN ) injection 4 mg  4 mg Intravenous Q6H PRN Jinny Carmine, MD       pantoprazole  (PROTONIX ) EC tablet 40 mg  40 mg Oral Daily Awanda City, MD   40 mg at 10/14/24 0902   traZODone  (DESYREL ) tablet 25 mg  25 mg Oral QHS  PRN Jinny Carmine, MD         Discharge Medications: Please see discharge summary for a list of discharge medications.  Relevant Imaging Results:  Relevant Lab Results:   Additional Information SS #: [id number]  Miray Mancino E Janiel Crisostomo, LCSW

## 2024-10-14 NOTE — Progress Notes (Signed)
 PROGRESS NOTE    Ralph Leon  FMW:[id number] DOB: 1936/05/12 DOA: 10/10/2024 PCP: [name], NP  [id number]  LOS: 4 days   Brief hospital course:   Assessment & Plan: Ralph Leon is a 88 y.o. patient male with medical history significant for COPD, essential hypertension and CVA, presented to the emergency room with acute onset of coffee-ground emesis with associated melena once today.  And not being on any blood thinners.  He has been feeling weak for the past day.  Denied any abdominal pain but has been having diarrhea and stools have been mostly brown.  He had coffee-ground emesis twice.  He used to drink alcohol in the past.  No chest pain or shortness of breath or cough or wheezing or hemoptysis.  No dysuria, oliguria or hematuria or flank pain.  No other bleeding diathesis.  He has been recommended GI workup before but was hesitant to have it done.  He has been following with oncology for possible underlying malignancy when he was recommended GI workup.    * Likely upper GI bleeding --coffee-ground emesis and anemia --GI consulted.   --EGD today showed esophagitis and Erosive gastropathy.  Ralph Leon refused colonoscopy. --cont protonix  daily --outpatient GI f/u  Acute respiratory failure with hypoxia (HCC) --after presentation, Ralph Leon became hypoxic and needed heated hf.  CXR on presentation wnl.  Unclear etiology, thought thought to be due to aspiration?  Thought Ralph Leon denied aspiration events. --down to 2L today --Continue supplemental O2 to keep sats >=90%, wean as tolerated  Transaminitis - The patient has an AST:ALT ratio higher than 5: 1, strongly suggesting alcoholic liver disease and possibly alcoholic hepatitis being new compared to last month. - Will obtain viral hepatitis panel. - We will follow LFTs with hydration. - This could be part of hepatorenal syndrome  AKI (acute kidney injury) - likely secondary to significant dehydration from recurrent nausea and  vomiting and anorexia. --cont MIVF  Sepsis, ruled out  Dyslipidemia - Will hold off statin therapy given his elevated transaminases.  Dementia with behavioral disturbance (HCC) --cont Aricept   Essential hypertension Will hold off diuretic therapy given AKI and GI bleeding as well as lisinopril  given AKI.  Hx of alcohol abuse --Ralph Leon denied recent use  Elevated D-dimer --15.76.   --VQ scan neg for PE   DVT prophylaxis: SCD/Compression stockings Code Status: DNR  Family Communication: friend updated at bedside today Level of care: Med-Surg Dispo:   The patient is from: home Anticipated d/c is to: SNF rehab Anticipated d/c date is: 2-3 days   Subjective and Interval History:  No new complaint today.   Objective: Vitals:   10/13/24 2021 10/14/24 0355 10/14/24 0851 10/14/24 1458  BP: 136/70 127/60 (!) 140/77 (!) 141/60  Pulse: 77 80 81 76  Resp: 20 16 16 16   Temp: 98.1 F (36.7 C) 98 F (36.7 C) 98.7 F (37.1 C) 98.1 F (36.7 C)  TempSrc: Oral Oral    SpO2: 100% 100% 100% 100%  Weight:      Height:        Intake/Output Summary (Last 24 hours) at 10/14/2024 1832 Last data filed at 10/14/2024 1800 Gross per 24 hour  Intake 784.84 ml  Output 700 ml  Net 84.84 ml   Filed Weights   10/10/24 2028 10/13/24 0957  Weight: 63.5 kg 63.5 kg    Examination:   Constitutional: NAD, AAOx3 HEENT: conjunctivae and lids normal, EOMI CV: No cyanosis.   RESP: normal respiratory effort Neuro: II - XII grossly intact.  Psych: Normal mood and affect.  Appropriate judgement and reason   Data Reviewed: I have personally reviewed labs and imaging studies  Time spent: 35 minutes  Ellouise Haber, MD [facility] If 7PM-7AM, please contact night-coverage 10/14/2024, 6:32 PM

## 2024-10-15 DIAGNOSIS — K922 Gastrointestinal hemorrhage, unspecified: Secondary | ICD-10-CM | POA: Diagnosis not present

## 2024-10-15 LAB — BASIC METABOLIC PANEL WITH GFR
Anion gap: 10 (ref 5–15)
BUN: 66 mg/dL — ABNORMAL HIGH (ref 8–23)
CO2: 23 mmol/L (ref 22–32)
Calcium: 7.9 mg/dL — ABNORMAL LOW (ref 8.9–10.3)
Chloride: 109 mmol/L (ref 98–111)
Creatinine, Ser: 3.65 mg/dL — ABNORMAL HIGH (ref 0.61–1.24)
GFR, Estimated: 16 mL/min — ABNORMAL LOW (ref >60)
Glucose, Bld: 130 mg/dL — ABNORMAL HIGH (ref 70–99)
Potassium: 4 mmol/L (ref 3.5–5.1)
Sodium: 142 mmol/L (ref 135–145)

## 2024-10-15 MED ORDER — CARMEX CLASSIC LIP BALM EX OINT
TOPICAL_OINTMENT | CUTANEOUS | Status: DC | PRN
  Filled 2024-10-15: qty 10

## 2024-10-15 MED ORDER — ENSURE PLUS HIGH PROTEIN PO LIQD
237.0000 mL | Freq: Two times a day (BID) | ORAL | Status: DC
  Administered 2024-10-15 – 2024-10-20 (×7): 237 mL via ORAL

## 2024-10-15 MED ORDER — LACTATED RINGERS IV SOLN: INTRAVENOUS | Status: AC

## 2024-10-15 MED ORDER — ADULT MULTIVITAMIN W/MINERALS CH
1.0000 | ORAL_TABLET | Freq: Every day | ORAL | Status: DC
  Administered 2024-10-16 – 2024-10-22 (×7): 1 via ORAL
  Filled 2024-10-15 (×7): qty 1

## 2024-10-15 NOTE — Progress Notes (Signed)
 Physical Therapy Treatment Patient Details Name: Ralph Leon MRN: [id number] DOB: [birth year] Today's Date: 10/15/2024   History of Present Illness Patient is a 88 year old male with acute onset of coffee-ground emesis with associated melena, possible GI bleed. EFY:RNEI, essential hypertension and CVA    PT Comments  Pt ready for session.  Two personal aides in room.  He is able to transition to sitting with heavy cues and min a x 1.  Stands and is able to march in place with min a x 1.  After short seated rest, he walks 25' self selected distance due to fatigue with RW and min a x 1.  Returns to supine after session.  Pt with O2 support during session.  Does not use at baseline at home and typically walks independently at home and in community with supervision He walk around the grocery store.  Aides report he is far from baseline mobility and will need to be more independent before returning home.  One aide who he lives with does use a SPC and is unable to safely provide direct physical assist.  <3 hrs of therapy a day at discharge remains appropriate.   If plan is discharge home, recommend the following: A little help with bathing/dressing/bathroom;Assistance with cooking/housework;A lot of help with walking and/or transfers   Can travel by private vehicle        Equipment Recommendations  None recommended by PT    Recommendations for Other Services       Precautions / Restrictions Precautions Precautions: Fall Recall of Precautions/Restrictions: Intact Restrictions Weight Bearing Restrictions Per Provider Order: No     Mobility  Bed Mobility Overal bed mobility: Needs Assistance Bed Mobility: Supine to Sit, Sit to Supine     Supine to sit: HOB elevated, Min assist Sit to supine: Mod assist, HOB elevated   General bed mobility comments: cues for technique Patient Response: Cooperative  Transfers Overall transfer level: Needs assistance Equipment used: Rolling walker  (2 wheels) Transfers: Sit to/from Stand Sit to Stand: Min assist           General transfer comment: lifting assistance for standing    Ambulation/Gait Ambulation/Gait assistance: Min assist Gait Distance (Feet): 25 Feet Assistive device: Rolling walker (2 wheels) Gait Pattern/deviations: Step-through pattern, Decreased step length - right, Decreased step length - left, Trunk flexed, Narrow base of support Gait velocity: dec     General Gait Details: able to walk just outside of door and back limited by fatigue.  pt walks independantly at home per aide  in room who he lives with.   Stairs             Wheelchair Mobility     Tilt Bed Tilt Bed Patient Response: Cooperative  Modified Rankin (Stroke Patients Only)       Balance Overall balance assessment: Needs assistance Sitting-balance support: Feet supported Sitting balance-Leahy Scale: Fair     Standing balance support: Bilateral upper extremity supported Standing balance-Leahy Scale: Poor Standing balance comment: external support required                            Communication Communication Communication: Impaired Factors Affecting Communication: Difficulty expressing self  Cognition Arousal: Alert Behavior During Therapy: WFL for tasks assessed/performed, Flat affect   PT - Cognitive impairments: Difficult to assess                       PT -  Cognition Comments: very soft spoken and difficult to understand at times Following commands: Impaired Following commands impaired: Only follows one step commands consistently    Cueing Cueing Techniques: Verbal cues  Exercises      General Comments        Pertinent Vitals/Pain Pain Assessment Pain Assessment: No/denies pain    Home Living                          Prior Function            PT Goals (current goals can now be found in the care plan section) Progress towards PT goals: Progressing toward  goals    Frequency    Min 2X/week      PT Plan      Co-evaluation              AM-PAC PT 6 Clicks Mobility   Outcome Measure  Help needed turning from your back to your side while in a flat bed without using bedrails?: A Little Help needed moving from lying on your back to sitting on the side of a flat bed without using bedrails?: A Little Help needed moving to and from a bed to a chair (including a wheelchair)?: A Little Help needed standing up from a chair using your arms (e.g., wheelchair or bedside chair)?: A Little Help needed to walk in hospital room?: A Little Help needed climbing 3-5 steps with a railing? : A Lot 6 Click Score: 17    End of Session Equipment Utilized During Treatment: Gait belt;Oxygen Activity Tolerance: Patient tolerated treatment well;Patient limited by fatigue Patient left: in bed;with call bell/phone within reach;with bed alarm set;with family/visitor present Nurse Communication: Mobility status PT Visit Diagnosis: Muscle weakness (generalized) (M62.81);Unsteadiness on feet (R26.81)     Time: 8484-8462 PT Time Calculation (min) (ACUTE ONLY): 22 min  Charges:    $Gait Training: 8-22 mins PT General Charges $$ ACUTE PT VISIT: 1 Visit

## 2024-10-15 NOTE — Progress Notes (Signed)
 Initial Nutrition Assessment  DOCUMENTATION CODES:   Not applicable  INTERVENTION:  - DYS 1 diet per MD.  - Ensure Plus High Protein po BID, each supplement provides 350 kcal and 20 grams of protein. - Encourage intake at all meals and of supplements as tolerated. - Multivitamin with minerals daily.  - Monitor weight trends.   NUTRITION DIAGNOSIS:   Increased nutrient needs related to acute illness as evidenced by estimated needs.  GOAL:   Patient will meet greater than or equal to 90% of their needs  MONITOR:   PO intake, Supplement acceptance, Weight trends  REASON FOR ASSESSMENT:   Consult Assessment of nutrition requirement/status  ASSESSMENT:   88 y.o. male with PMH significant for COPD, essential hypertension and CVA, who presented with acute onset of coffee-ground emesis with associated melena. Admitted for GI bleed.   RD working remotely. Called patient via bedside telephone to obtain nutrition history but unable to reach patient.   Per EMR, weight with no significant changes over the past year.  Patient previously on a Regular diet but per MD note yesterday, patient requesting a softer diet.  He is now on a DYS 1 diet. Patient is documented to be consuming 25-100% of meals over the past 3 days. Will add ONS to support oral intake.    Medications reviewed and include: Protonix   Labs reviewed:  Creatinine 3.65   NUTRITION - FOCUSED PHYSICAL EXAM:  RD working remotely  Diet Order:   Diet Order             DIET - DYS 1 Room service appropriate? Yes; Fluid consistency: Thin  Diet effective now                   EDUCATION NEEDS:  Not appropriate for education at this time  Skin:  Skin Assessment: Skin Integrity Issues: Skin Integrity Issues:: Stage I Stage I: Sacrum  Last BM:  11/23 - type 6  Height:  Ht Readings from Last 1 Encounters:  10/13/24 5' 4 (1.626 m)   Weight:  Wt Readings from Last 1 Encounters:  10/13/24 63.5 kg     BMI:  Body mass index is 24.03 kg/m.  Estimated Nutritional Needs:  Kcal:  1600-1700 kcals Protein:  70-85 grams Fluid:  >/= 1.6L   Trude Ned RD, LDN Contact via Secure Chat.

## 2024-10-15 NOTE — Progress Notes (Signed)
 PROGRESS NOTE    Ralph Leon  FMW:[id number] DOB: October 13, 1936 DOA: 10/10/2024 PCP: [name], NP  224A/[id number]-AA  LOS: 5 days   Brief hospital course:   Assessment & Plan: Ralph Leon is a 88 y.o. patient male with medical history significant for COPD, essential hypertension and CVA, presented to the emergency room with acute onset of coffee-ground emesis with associated melena once today.  And not being on any blood thinners.  He has been feeling weak for the past day.  Denied any abdominal pain but has been having diarrhea and stools have been mostly brown.  He had coffee-ground emesis twice.  He used to drink alcohol in the past.  No chest pain or shortness of breath or cough or wheezing or hemoptysis.  No dysuria, oliguria or hematuria or flank pain.  No other bleeding diathesis.  He has been recommended GI workup before but was hesitant to have it done.  He has been following with oncology for possible underlying malignancy when he was recommended GI workup.    * Likely upper GI bleeding --coffee-ground emesis and anemia --GI consulted.   --EGD showed esophagitis and Erosive gastropathy.  Pt refused colonoscopy. --cont protonix  daily --outpatient GI f/u  Acute respiratory failure with hypoxia (HCC) --after presentation, pt became hypoxic and needed heated hf.  CXR on presentation wnl.  Unclear etiology, thought thought to be due to aspiration?  Thought pt denied aspiration events. --down to 2L. --Continue supplemental O2 to keep sats >=90%, wean as tolerated  Transaminitis - The patient has an AST:ALT ratio higher than 5: 1, strongly suggesting alcoholic liver disease and possibly alcoholic hepatitis being new compared to last month. - Will obtain viral hepatitis panel. - We will follow LFTs with hydration. - This could be part of hepatorenal syndrome  AKI (acute kidney injury) - likely secondary to significant dehydration from recurrent nausea and vomiting and  anorexia.  Cr finally started to improve with MIVF --cont MIVF  Sepsis, ruled out  Dyslipidemia - Will hold off statin therapy given his elevated transaminases.  Dementia with behavioral disturbance (HCC) --cont Aricept   Essential hypertension Will hold off diuretic therapy given AKI and GI bleeding as well as lisinopril  given AKI.  Hx of alcohol abuse --pt denied recent use  Elevated D-dimer --15.76.   --VQ scan neg for PE   DVT prophylaxis: SCD/Compression stockings Code Status: DNR  Family Communication:  Level of care: Med-Surg Dispo:   The patient is from: home Anticipated d/c is to: SNF rehab Anticipated d/c date is: 1-2 days   Subjective and Interval History:  No new complaint today.  Cr finally started to improve.   Objective: Vitals:   10/14/24 1458 10/14/24 2200 10/15/24 0419 10/15/24 0827  BP: (!) 141/60 (!) 148/68 (!) 149/75 (P) 136/70  Pulse: 76 78 83 (P) 88  Resp: 16 16 16  (P) 16  Temp: 98.1 F (36.7 C) 98.3 F (36.8 C) (!) 97.4 F (36.3 C) (P) 97.8 F (36.6 C)  TempSrc:  Oral Oral (P) Oral  SpO2: 100% 100% 99% (P) 100%  Weight:      Height:        Intake/Output Summary (Last 24 hours) at 10/15/2024 1707 Last data filed at 10/15/2024 1300 Gross per 24 hour  Intake 1404.88 ml  Output 1800 ml  Net -395.12 ml   Filed Weights   10/10/24 2028 10/13/24 0957  Weight: 63.5 kg 63.5 kg    Examination:   Constitutional: NAD CV: No cyanosis.   RESP:  normal respiratory effort, on 2L   Data Reviewed: I have personally reviewed labs and imaging studies  Time spent: 35 minutes  Ellouise Haber, MD [facility] If 7PM-7AM, please contact night-coverage 10/15/2024, 5:07 PM

## 2024-10-15 NOTE — Plan of Care (Signed)
   Problem: Respiratory: Goal: Ability to maintain adequate ventilation will improve Outcome: Progressing

## 2024-10-16 DIAGNOSIS — K922 Gastrointestinal hemorrhage, unspecified: Secondary | ICD-10-CM | POA: Diagnosis not present

## 2024-10-16 LAB — CREATININE, SERUM
Creatinine, Ser: 2.86 mg/dL — ABNORMAL HIGH (ref 0.61–1.24)
GFR, Estimated: 21 mL/min — ABNORMAL LOW (ref >60)

## 2024-10-16 MED ORDER — LACTATED RINGERS IV SOLN: INTRAVENOUS | Status: DC

## 2024-10-16 NOTE — Progress Notes (Signed)
 PROGRESS NOTE    Ralph Leon  FMW:[id number] DOB: October 26, 1936 DOA: 10/10/2024 PCP: [name], NP  224A/[id number]-AA  LOS: 6 days   Brief hospital course:   Assessment & Plan: Ralph Leon is a 88 y.o. patient male with medical history significant for COPD, essential hypertension and CVA, presented to the emergency room with acute onset of coffee-ground emesis with associated melena once today.  And not being on any blood thinners.  He has been feeling weak for the past day.  Denied any abdominal pain but has been having diarrhea and stools have been mostly brown.  He had coffee-ground emesis twice.  He used to drink alcohol in the past.  No chest pain or shortness of breath or cough or wheezing or hemoptysis.  No dysuria, oliguria or hematuria or flank pain.  No other bleeding diathesis.  He has been recommended GI workup before but was hesitant to have it done.  He has been following with oncology for possible underlying malignancy when he was recommended GI workup.    * Likely upper GI bleeding --coffee-ground emesis and anemia --GI consulted.   --EGD showed esophagitis and Erosive gastropathy.  Pt refused colonoscopy. --cont protonix  daily --outpatient GI f/u  Acute on chronic respiratory failure with hypoxia (HCC) --after presentation, pt became hypoxic and needed heated hf.  CXR on presentation wnl.  Unclear etiology, thought thought to be due to aspiration?  Thought pt denied aspiration events. --down to 2L O2 now. --Continue supplemental O2 to keep sats >=90%  Transaminitis - The patient has an AST:ALT ratio higher than 5: 1, strongly suggesting alcoholic liver disease and possibly alcoholic hepatitis being new compared to last month. - Will obtain viral hepatitis panel. - We will follow LFTs with hydration. - This could be part of hepatorenal syndrome  AKI (acute kidney injury) - likely secondary to significant dehydration from recurrent nausea and vomiting and  anorexia.  Cr finally started to improve with MIVF --cont MIVF@50   Sepsis, ruled out  Dyslipidemia - hold statin  Dementia with behavioral disturbance (HCC) --cont Aricept   Essential hypertension Will hold off diuretic therapy given AKI and GI bleeding as well as lisinopril  given AKI.  Hx of alcohol abuse --pt denied recent use  Elevated D-dimer --15.76.   --VQ scan neg for PE   DVT prophylaxis: SCD/Compression stockings Code Status: DNR  Family Communication:  Level of care: Med-Surg Dispo:   The patient is from: home Anticipated d/c is to: SNF rehab Anticipated d/c date is: 1-2 days   Subjective and Interval History:  Pt reported not feeling well today, but unclear why.   Objective: Vitals:   10/15/24 2105 10/16/24 0452 10/16/24 0700 10/16/24 1608  BP: (!) 145/72 135/65 135/60 (!) 146/68  Pulse: 91 77 75 83  Resp: 20 18 16 18   Temp: 98.7 F (37.1 C) 97.7 F (36.5 C) 97.9 F (36.6 C) 98.4 F (36.9 C)  TempSrc:   Axillary   SpO2: 100% 100% 100% 100%  Weight:      Height:        Intake/Output Summary (Last 24 hours) at 10/16/2024 1747 Last data filed at 10/16/2024 1326 Gross per 24 hour  Intake 875.66 ml  Output 1500 ml  Net -624.34 ml   Filed Weights   10/10/24 2028 10/13/24 0957  Weight: 63.5 kg 63.5 kg    Examination:   Constitutional: NAD, alert, oriented HEENT: conjunctivae and lids normal, EOMI CV: No cyanosis.   RESP: normal respiratory effort, on 2L Psych: subdued  mood and affect.     Data Reviewed: I have personally reviewed labs and imaging studies  Time spent: 35 minutes  Ellouise Haber, MD [facility] If 7PM-7AM, please contact night-coverage 10/16/2024, 5:47 PM

## 2024-10-17 ENCOUNTER — Inpatient Hospital Stay

## 2024-10-17 DIAGNOSIS — K922 Gastrointestinal hemorrhage, unspecified: Secondary | ICD-10-CM | POA: Diagnosis not present

## 2024-10-17 LAB — RESP PANEL BY RT-PCR (RSV, FLU A&B, COVID)  RVPGX2
Influenza A by PCR: NEGATIVE
Influenza B by PCR: NEGATIVE
Resp Syncytial Virus by PCR: NEGATIVE
SARS Coronavirus 2 by RT PCR: NEGATIVE

## 2024-10-17 LAB — CREATININE, SERUM
Creatinine, Ser: 2.39 mg/dL — ABNORMAL HIGH (ref 0.61–1.24)
GFR, Estimated: 26 mL/min — ABNORMAL LOW (ref >60)

## 2024-10-17 LAB — HEMOGLOBIN: Hemoglobin: 6.5 g/dL — ABNORMAL LOW (ref 13.0–17.0)

## 2024-10-17 MED ORDER — SODIUM CHLORIDE 0.9% IV SOLUTION: Freq: Once | INTRAVENOUS | Status: AC

## 2024-10-17 MED ORDER — IPRATROPIUM-ALBUTEROL 0.5-2.5 (3) MG/3ML IN SOLN
3.0000 mL | Freq: Two times a day (BID) | RESPIRATORY_TRACT | Status: DC
  Administered 2024-10-17 – 2024-10-18 (×3): 3 mL via RESPIRATORY_TRACT
  Filled 2024-10-17 (×3): qty 3

## 2024-10-17 MED ORDER — SODIUM CHLORIDE 0.9 % IV BOLUS
500.0000 mL | Freq: Once | INTRAVENOUS | Status: AC
  Administered 2024-10-17: 500 mL via INTRAVENOUS

## 2024-10-17 MED ORDER — LACTATED RINGERS IV SOLN: INTRAVENOUS | Status: DC

## 2024-10-17 NOTE — Progress Notes (Signed)
 PROGRESS NOTE    Ralph Leon  FMW:[id number] DOB: [birth year] DOA: 10/10/2024 PCP: Ralph Pao, NP  224A/[id number]-AA  LOS: 7 days   Brief hospital course:   Assessment & Plan: Ralph Leon is a 88 y.o. patient male with medical history significant for COPD, essential hypertension and CVA, presented to the emergency room with acute onset of coffee-ground emesis with associated melena once today.  And not being on any blood thinners.  He has been feeling weak for the past day.  Denied any abdominal pain but has been having diarrhea and stools have been mostly brown.  He had coffee-ground emesis twice.  He used to drink alcohol in the past.  No chest pain or shortness of breath or cough or wheezing or hemoptysis.  No dysuria, oliguria or hematuria or flank pain.  No other bleeding diathesis.  He has been recommended GI workup before but was hesitant to have it done.  He has been following with oncology for possible underlying malignancy when he was recommended GI workup.    * Likely upper GI bleeding --coffee-ground emesis and anemia --GI consulted.   --EGD showed esophagitis and Erosive gastropathy.  Pt refused colonoscopy. --cont protonix  daily --outpatient GI f/u  Acute on chronic respiratory failure with hypoxia (HCC) On 2L O2 at baseline --after presentation, pt became hypoxic and needed heated hf.  CXR on presentation wnl.  Unclear etiology, thought thought to be due to aspiration?  Thought pt denied aspiration events. --down to 2L O2 --Continue supplemental O2 to keep sats >=90%  Transaminitis, improved - The patient has an AST:ALT ratio higher than 5: 1, strongly suggesting alcoholic liver disease and possibly alcoholic hepatitis being new compared to last month. - acute viral hepatitis panel neg  AKI (acute kidney injury) - likely secondary to significant dehydration from recurrent nausea and vomiting and anorexia.  Cr finally started to improve with MIVF --d/c MIVF  today  Sepsis, ruled out  Dyslipidemia - hold statin  Dementia with behavioral disturbance (HCC) --cont Aricept   Essential hypertension --hold home anti-hypertensives  Hx of alcohol abuse --pt denied recent use  Elevated D-dimer --15.76.   --VQ scan neg for PE  Fever --fever to 103 today.  Resp panel neg. --CXR showed Hazy and patchy basilar opacities new since presentation. --obtain procal and RVP --consider starting abx if procal elevated and RVP neg.   DVT prophylaxis: SCD/Compression stockings Code Status: DNR  Family Communication:  Level of care: Med-Surg Dispo:   The patient is from: home Anticipated d/c is to: SNF rehab Anticipated d/c date is: 1-2 days   Subjective and Interval History:  Pt had a fever to 103 today, no cough pre RN.   Objective: Vitals:   10/17/24 1944 10/17/24 2003 10/17/24 2116 10/17/24 2125  BP: (!) 97/46 (!) 95/40  (!) 93/48  Pulse: (!) 109 (!) 104  95  Resp: 20 20  20   Temp: 99.2 F (37.3 C) 98.9 F (37.2 C)    TempSrc: Oral Oral    SpO2: 98% 97% 97% 99%  Weight:      Height:        Intake/Output Summary (Last 24 hours) at 10/17/2024 2137 Last data filed at 10/17/2024 1056 Gross per 24 hour  Intake --  Output 350 ml  Net -350 ml   Filed Weights   10/10/24 2028 10/13/24 0957  Weight: 63.5 kg 63.5 kg    Examination:   Constitutional: NAD CV: No cyanosis.   RESP: normal respiratory effort, on 2L  Data Reviewed: I have personally reviewed labs and imaging studies  Time spent: 50 minutes  Ralph Haber, MD [facility] If 7PM-7AM, please contact night-coverage 10/17/2024, 9:37 PM

## 2024-10-17 NOTE — Progress Notes (Signed)
 Physical Therapy Treatment Patient Details Name: Ralph Leon MRN: [id number] DOB: [birth year] Today's Date: 10/17/2024   History of Present Illness Patient is a 88 year old male with acute onset of coffee-ground emesis with associated melena, possible GI bleed. EFY:RNEI, essential hypertension and CVA    PT Comments  Pt in bed.  Reports fatigue but agrees to OOB for lunch.  Min a x 1 to EOB with increased time.  Steady in sitting.  He stands to RW with min a x 1 from elevated bed.  He is a bit more hesitant today and slowly steps to recliner at bedside and begins to sit.  Pt stated he did not feel he could stand longer.  Generally fatigued but does some seated AROM.  Once sitting, he has audible wheezes which were not noted on Sunday.  Discussed with RN who goes in to check and will follow up as appropriate. Remained up with need met.  Sats 92 % on 2 lpm.   If plan is discharge home, recommend the following: A little help with bathing/dressing/bathroom;Assistance with cooking/housework;A lot of help with walking and/or transfers   Can travel by private vehicle        Equipment Recommendations       Recommendations for Other Services       Precautions / Restrictions Precautions Precautions: Fall Recall of Precautions/Restrictions: Intact Restrictions Weight Bearing Restrictions Per Provider Order: No     Mobility  Bed Mobility Overal bed mobility: Needs Assistance Bed Mobility: Supine to Sit     Supine to sit: HOB elevated, Min assist       Patient Response: Cooperative  Transfers Overall transfer level: Needs assistance Equipment used: Rolling walker (2 wheels) Transfers: Sit to/from Stand Sit to Stand: Min assist                Ambulation/Gait Ambulation/Gait assistance: Editor, Commissioning (Feet): 3 Feet Assistive device: Rolling walker (2 wheels) Gait Pattern/deviations: Step-through pattern, Decreased step length - right, Decreased step length -  left, Trunk flexed, Narrow base of support Gait velocity: dec     General Gait Details: fatigue today, mobility limited to trasnfers only   Optometrist     Tilt Bed Tilt Bed Patient Response: Cooperative  Modified Rankin (Stroke Patients Only)       Balance Overall balance assessment: Needs assistance   Sitting balance-Leahy Scale: Fair     Standing balance support: Bilateral upper extremity supported Standing balance-Leahy Scale: Poor Standing balance comment: external support required                            Communication Communication Communication: Impaired Factors Affecting Communication: Difficulty expressing self  Cognition Arousal: Alert Behavior During Therapy: WFL for tasks assessed/performed, Flat affect   PT - Cognitive impairments: Difficult to assess Difficult to assess due to: Impaired communication                     PT - Cognition Comments: very soft spoken and difficult to understand at times, appropriate during session Following commands: Impaired Following commands impaired: Follows one step commands with increased time    Cueing Cueing Techniques: Verbal cues  Exercises Other Exercises Other Exercises: limited seated AROM in chair    General Comments        Pertinent Vitals/Pain Pain Assessment Pain Assessment: No/denies pain  Home Living                          Prior Function            PT Goals (current goals can now be found in the care plan section) Progress towards PT goals: Progressing toward goals    Frequency    Min 2X/week      PT Plan      Co-evaluation              AM-PAC PT 6 Clicks Mobility   Outcome Measure  Help needed turning from your back to your side while in a flat bed without using bedrails?: A Little Help needed moving from lying on your back to sitting on the side of a flat bed without using bedrails?: A  Little Help needed moving to and from a bed to a chair (including a wheelchair)?: A Little Help needed standing up from a chair using your arms (e.g., wheelchair or bedside chair)?: A Little Help needed to walk in hospital room?: A Lot Help needed climbing 3-5 steps with a railing? : A Lot 6 Click Score: 16    End of Session Equipment Utilized During Treatment: Gait belt;Oxygen Activity Tolerance: Patient tolerated treatment well;Patient limited by fatigue Patient left: in chair;with call bell/phone within reach;with chair alarm set Nurse Communication: Mobility status PT Visit Diagnosis: Muscle weakness (generalized) (M62.81);Unsteadiness on feet (R26.81)     Time: 8867-8848 PT Time Calculation (min) (ACUTE ONLY): 19 min  Charges:    $Therapeutic Activity: 8-22 mins PT General Charges $$ ACUTE PT VISIT: 1 Visit                   Lauraine Gills, PTA 10/17/24, 1:08 PM

## 2024-10-17 NOTE — Plan of Care (Signed)
  Problem: Clinical Measurements: Goal: Diagnostic test results will improve Outcome: Progressing   Problem: Education: Goal: Knowledge of General Education information will improve Description: Including pain rating scale, medication(s)/side effects and non-pharmacologic comfort measures Outcome: Progressing   Problem: Clinical Measurements: Goal: Diagnostic test results will improve Outcome: Progressing   Problem: Nutrition: Goal: Adequate nutrition will be maintained Outcome: Progressing

## 2024-10-17 NOTE — Progress Notes (Signed)
 Mobility Specialist - Progress Note   10/17/24 1351  Mobility  Activity Pivoted/transferred from chair to bed  Level of Assistance Minimal assist, patient does 75% or more  Assistive Device Front wheel walker  Distance Ambulated (ft) 2 ft  Activity Response Tolerated well  Mobility visit 1 Mobility  Mobility Specialist Start Time (ACUTE ONLY) 1341  Mobility Specialist Stop Time (ACUTE ONLY) 1349  Mobility Specialist Time Calculation (min) (ACUTE ONLY) 8 min   Pt sitting in the recliner upon entry, utilizing [place]. Pt STS to RW and transferred to bed via SPT MinA--- dyspnea noted upon return to bed. Pt left semi fowler with alarm set and needs within reach.  America Silvan Mobility Specialist 10/17/24 1:56 PM

## 2024-10-17 NOTE — TOC Initial Note (Signed)
 Transition of Care [facility]) - Initial/Assessment Note    Patient Details  Name: Ralph Leon MRN: [id number] Date of Birth: 04-12-1936  Transition of Care [facility]) CM/SW Contact:    Alfonso Rummer, LCSW Phone Number: 10/17/2024, 4:09 PM  Clinical Narrative:                 KEN DELENA Rummer spk with patient at bedside to discuss skilled nursing facility recommendations. Pt reports he will transition to [facility] and request LCSW A. Rummer to contact his brother Jorge Retz [phone] to advise of decision.         Patient Goals and CMS Choice            Expected Discharge Plan and Services    [facility].                                           Prior Living Arrangements/Services                       Activities of Daily Living   ADL Screening (condition at time of admission) Independently performs ADLs?: No Does the patient have a NEW difficulty with bathing/dressing/toileting/self-feeding that is expected to last >3 days?: Yes (Initiates electronic notice to provider for possible OT consult) Does the patient have a NEW difficulty with getting in/out of bed, walking, or climbing stairs that is expected to last >3 days?: Yes (Initiates electronic notice to provider for possible PT consult) Does the patient have a NEW difficulty with communication that is expected to last >3 days?: No Is the patient deaf or have difficulty hearing?: No Does the patient have difficulty seeing, even when wearing glasses/contacts?: No Does the patient have difficulty concentrating, remembering, or making decisions?: No  Permission Sought/Granted                  Emotional Assessment              Admission diagnosis:  Aspiration pneumonitis (HCC) [J69.0] GI bleeding [K92.2] Transaminitis [R74.01] AKI (acute kidney injury) [N17.9] Gastrointestinal hemorrhage with melena [K92.1] Patient Active Problem List   Diagnosis Date Noted   Hematemesis  without nausea 10/11/2024   GI bleeding 10/10/2024   Dyslipidemia 10/10/2024   AKI (acute kidney injury) 10/10/2024   Transaminitis 10/10/2024   Dementia with behavioral disturbance (HCC) 10/10/2024   Aspiration pneumonitis (HCC) 10/10/2024   Acute respiratory failure with hypoxia (HCC) 10/10/2024   Sepsis due to undetermined organism (HCC) 10/10/2024   AAA (abdominal aortic aneurysm) 09/21/2024   Normocytic anemia 09/06/2024   Iron  deficiency anemia 09/06/2024   Occult blood in stools 09/06/2024   Weight loss 08/14/2024   Urinary symptom or sign 08/07/2024   Vitamin D  deficiency 11/02/2022   Other fatigue 08/31/2022   Stroke (HCC)    Fall 04/04/2021   Aortic atherosclerosis 02/12/2021   Hypercholesteremia 01/20/2021   Coronary artery disease 01/20/2021   IFG (impaired fasting glucose) 07/08/2020   Essential hypertension 03/03/2020   History of CVA (cerebrovascular accident) 03/03/2020   COPD (chronic obstructive pulmonary disease) (HCC)    PCP:  [name], NP Pharmacy:   [facility] [address] ([address]), [place] - [address] [place]) [place] [address] Phone: [phone] Fax: [phone]     Social Drivers of Health (SDOH) Social History: SDOH Screenings   Food Insecurity: Patient  Declined (10/11/2024)  Housing: Unknown (10/11/2024)  Transportation Needs: Patient Declined (10/11/2024)  Utilities: Not At Risk (10/11/2024)  Alcohol Screen: Low Risk  (04/19/2024)  Depression (PHQ2-9): Low Risk  (10/02/2024)  Financial Resource Strain: Low Risk  (09/11/2024)   Received from [facility]  Physical Activity: Insufficiently Active (04/19/2024)  Social Connections: Socially Isolated (10/11/2024)  Stress: No Stress Concern Present (04/19/2024)  Tobacco Use: Medium Risk (10/13/2024)  Health Literacy: Adequate Health Literacy (04/19/2024)   SDOH Interventions:     Readmission Risk Interventions      No data to display

## 2024-10-18 DIAGNOSIS — N179 Acute kidney failure, unspecified: Secondary | ICD-10-CM | POA: Diagnosis not present

## 2024-10-18 DIAGNOSIS — K922 Gastrointestinal hemorrhage, unspecified: Secondary | ICD-10-CM | POA: Diagnosis not present

## 2024-10-18 LAB — CBC
HCT: 26.2 % — ABNORMAL LOW (ref 39.0–52.0)
Hemoglobin: 7.9 g/dL — ABNORMAL LOW (ref 13.0–17.0)
MCH: 25.4 pg — ABNORMAL LOW (ref 26.0–34.0)
MCHC: 30.2 g/dL (ref 30.0–36.0)
MCV: 84.2 fL (ref 80.0–100.0)
Platelets: 284 K/uL (ref 150–400)
RBC: 3.11 MIL/uL — ABNORMAL LOW (ref 4.22–5.81)
RDW: 18.9 % — ABNORMAL HIGH (ref 11.5–15.5)
WBC: 11.6 K/uL — ABNORMAL HIGH (ref 4.0–10.5)
nRBC: 0 % (ref 0.0–0.2)

## 2024-10-18 LAB — BASIC METABOLIC PANEL WITH GFR
Anion gap: 9 (ref 5–15)
BUN: 46 mg/dL — ABNORMAL HIGH (ref 8–23)
CO2: 24 mmol/L (ref 22–32)
Calcium: 7.9 mg/dL — ABNORMAL LOW (ref 8.9–10.3)
Chloride: 111 mmol/L (ref 98–111)
Creatinine, Ser: 2.24 mg/dL — ABNORMAL HIGH (ref 0.61–1.24)
GFR, Estimated: 28 mL/min — ABNORMAL LOW (ref >60)
Glucose, Bld: 141 mg/dL — ABNORMAL HIGH (ref 70–99)
Potassium: 3.4 mmol/L — ABNORMAL LOW (ref 3.5–5.1)
Sodium: 144 mmol/L (ref 135–145)

## 2024-10-18 LAB — PROCALCITONIN: Procalcitonin: 39.1 ng/mL

## 2024-10-18 LAB — RESPIRATORY PANEL BY PCR

## 2024-10-18 LAB — MAGNESIUM: Magnesium: 1.5 mg/dL — ABNORMAL LOW (ref 1.7–2.4)

## 2024-10-18 LAB — PREPARE RBC (CROSSMATCH)

## 2024-10-18 MED ORDER — POTASSIUM CHLORIDE CRYS ER 20 MEQ PO TBCR
40.0000 meq | EXTENDED_RELEASE_TABLET | Freq: Once | ORAL | Status: AC
  Administered 2024-10-18: 40 meq via ORAL
  Filled 2024-10-18: qty 2

## 2024-10-18 MED ORDER — SODIUM CHLORIDE 0.9 % IV SOLN
1.0000 g | INTRAVENOUS | Status: DC
  Administered 2024-10-18 – 2024-10-19 (×2): 1 g via INTRAVENOUS
  Filled 2024-10-18 (×2): qty 10

## 2024-10-18 MED ORDER — DOXYCYCLINE HYCLATE 100 MG PO TABS
100.0000 mg | ORAL_TABLET | Freq: Two times a day (BID) | ORAL | Status: DC
  Administered 2024-10-18 – 2024-10-22 (×8): 100 mg via ORAL
  Filled 2024-10-18 (×8): qty 1

## 2024-10-18 MED ORDER — MAGNESIUM SULFATE 2 GM/50ML IV SOLN
2.0000 g | Freq: Once | INTRAVENOUS | Status: AC
  Administered 2024-10-18: 2 g via INTRAVENOUS
  Filled 2024-10-18: qty 50

## 2024-10-18 MED ORDER — IPRATROPIUM-ALBUTEROL 0.5-2.5 (3) MG/3ML IN SOLN: 3.0000 mL | Freq: Four times a day (QID) | RESPIRATORY_TRACT | Status: DC | PRN

## 2024-10-18 NOTE — Progress Notes (Signed)
 Mobility Specialist - Progress Note   10/18/24 1400  Mobility  Activity Stood at bedside  Level of Assistance Contact guard assist, steadying assist  Assistive Device Front wheel walker  Range of Motion/Exercises Active;Right leg;Left leg  Activity Response Tolerated well  Mobility visit 1 Mobility  Mobility Specialist Start Time (ACUTE ONLY) 1518  Mobility Specialist Stop Time (ACUTE ONLY) 1533  Mobility Specialist Time Calculation (min) (ACUTE ONLY) 15 min   Pt supine upon entry, utilizing RA. Pt denied transfer to the recliner, however agreeable to stand EOB. Pt completed bed mob and STS to RW MinA, requiring CGA while standing and completing 4 mini marches. Pt returned EOB as RN enters, left supine with needs within reach.  America Silvan Mobility Specialist 10/18/24 3:55 PM

## 2024-10-18 NOTE — Progress Notes (Signed)
 [name] received 1 unit PRBC for  hemaglobin 6.5 and tolerated well. No s/s of adverse reaction. Remains A&O x 4 with no complaints of pain. H&H recheck for 0630.

## 2024-10-18 NOTE — Progress Notes (Signed)
   10/17/24 1944  Assess: MEWS Score  Temp 99.2 F (37.3 C)  BP (!) 97/46 (I notified the nurse Moniqe)  MAP (mmHg) (!) 62  Pulse Rate (!) 109  Resp 20  SpO2 98 %  O2 Device Nasal Cannula  Assess: MEWS Score  MEWS Temp 0  MEWS Systolic 1  MEWS Pulse 1  MEWS RR 0  MEWS LOC 0  MEWS Score 2  MEWS Score Color Yellow  Assess: if the MEWS score is Yellow or Red  Were vital signs accurate and taken at a resting state? Yes  Does the patient meet 2 or more of the SIRS criteria? No  MEWS guidelines implemented  Yes, yellow  Treat  MEWS Interventions Considered administering scheduled or prn medications/treatments as ordered  Take Vital Signs  Increase Vital Sign Frequency  Yellow: Q2hr x1, continue Q4hrs until patient remains green for 12hrs  Escalate  MEWS: Escalate Yellow: Discuss with charge nurse and consider notifying provider and/or RRT  Notify: Charge Nurse/RN  Name of Charge Nurse/RN Notified Levi,RN  Provider Notification  Provider Name/Title Delayne Solian, MD  Date Provider Notified 10/17/24  Time Provider Notified 2017  Method of Notification  (secure chat)  Notification Reason Change in status  Provider response See new orders  Date of Provider Response 10/17/24  Time of Provider Response 2049  Assess: SIRS CRITERIA  SIRS Temperature  0  SIRS Respirations  0  SIRS Pulse 1  SIRS WBC 0  SIRS Score Sum  1

## 2024-10-18 NOTE — Progress Notes (Signed)
 PROGRESS NOTE    Ralph Leon  FMW:[id number] DOB: 1936/03/01 DOA: 10/10/2024 PCP: [name], NP  224A/[id number]-AA  LOS: 8 days   Brief hospital course:   Assessment & Plan: Ralph Leon is a 88 y.o. patient male with medical history significant for COPD, essential hypertension and CVA, presented to the emergency room with acute onset of coffee-ground emesis with associated melena once today.  And not being on any blood thinners.  He has been feeling weak for the past day.  Denied any abdominal pain but has been having diarrhea and stools have been mostly brown.  He had coffee-ground emesis twice.  He used to drink alcohol in the past.  No chest pain or shortness of breath or cough or wheezing or hemoptysis.  No dysuria, oliguria or hematuria or flank pain.  No other bleeding diathesis.  He has been recommended GI workup before but was hesitant to have it done.  He has been following with oncology for possible underlying malignancy when he was recommended GI workup.    Likely upper GI bleeding Acute blood loss anemia - Presented with coffee-ground emesis and anemia - Seen by GI, appreciate recs - EGD showed esophagitis and Erosive gastropathy.  Pt refused colonoscopy/ further work up - s/p 1u pRBC, Hb stable 7.9 - cont protonix  daily, outpatient GI f/u  AKI (acute kidney injury) - likely secondary to significant dehydration from recurrent nausea and vomiting and anorexia.  Cr finally started to improve with MIVF - Now off IV fluids, encourage po intake, Monitor Cr  Acute on chronic respiratory failure with hypoxia Possible pneumonia - Pt became hypoxic and needed heated HFNC, now down to 2L [place] (baseline) - RVP negative, had fever, procalcitonin elevated, Leukocytosis - CXR shows hazy and patchy basilar opacities, suspicious for pneumonic infiltrate vs atelectasis.  Small new pleural effusions - Start empiric antibiotics with IV Ceftriaxone , doxycycline  - Follow blood  culture - Continue supplemental O2 to keep sats >=90% - SLP eval to r/o aspiration  Hypokalemia Hypomagnesemia - Monitor and replete as needed  Transaminitis, improved - The patient has an AST:ALT ratio higher than 5: 1, strongly suggesting alcoholic liver disease and possibly alcoholic hepatitis being new compared to last month. - acute viral hepatitis panel neg  Dyslipidemia - hold statin  Dementia with behavioral disturbance (HCC) - cont Aricept   Essential hypertension - hold home anti-hypertensives  Hx of alcohol abuse - pt denied recent use  Elevated D-dimer - 15.76.   - VQ scan neg for PE   DVT prophylaxis: SCD/Compression stockings Code Status: DNR  Family Communication:  Level of care: Med-Surg Dispo:   The patient is from: home Anticipated d/c is to: SNF rehab Anticipated d/c date is: 1-2 days   Subjective and Interval History:  Patient was examined at the bedside, new to me today. Denies any new complaints today.  Started on empiric antibiotics for pneumonia Replete electrolytes Trend Ralph Leon present at the bedside   Objective: Vitals:   10/18/24 0200 10/18/24 0216 10/18/24 0502 10/18/24 0502  BP: (!) 108/45 (!) 111/50 (!) 111/51 (!) 111/51  Pulse: 77 76 75 75  Resp: 20 20 20 20   Temp: 98 F (36.7 C) 98.1 F (36.7 C) 98.3 F (36.8 C) 98.3 F (36.8 C)  TempSrc: Oral Oral Oral Oral  SpO2: 100% 100%  100%  Weight:      Height:        Intake/Output Summary (Last 24 hours) at 10/18/2024 9176 Last data filed at 10/18/2024 0502 Gross  per 24 hour  Intake 756.5 ml  Output 850 ml  Net -93.5 ml   Filed Weights   10/10/24 2028 10/13/24 0957  Weight: 63.5 kg 63.5 kg    Examination:   Constitutional: NAD CV: No cyanosis.   RESP: normal respiratory effort, on 2L   Data Reviewed: I have personally reviewed labs and imaging studies  Time spent: 50 minutes  [name], MD [facility] If 7PM-7AM, please contact  night-coverage 10/18/2024, 8:23 AM

## 2024-10-19 DIAGNOSIS — N179 Acute kidney failure, unspecified: Secondary | ICD-10-CM | POA: Diagnosis not present

## 2024-10-19 DIAGNOSIS — K922 Gastrointestinal hemorrhage, unspecified: Secondary | ICD-10-CM | POA: Diagnosis not present

## 2024-10-19 LAB — TYPE AND SCREEN
ABO/RH(D): O POS
Antibody Screen: NEGATIVE
Unit division: 0

## 2024-10-19 LAB — COMPREHENSIVE METABOLIC PANEL WITH GFR
ALT: 31 U/L (ref 0–44)
AST: 29 U/L (ref 15–41)
Albumin: 2.6 g/dL — ABNORMAL LOW (ref 3.5–5.0)
Alkaline Phosphatase: 72 U/L (ref 38–126)
Anion gap: 10 (ref 5–15)
BUN: 40 mg/dL — ABNORMAL HIGH (ref 8–23)
CO2: 22 mmol/L (ref 22–32)
Calcium: 7.9 mg/dL — ABNORMAL LOW (ref 8.9–10.3)
Chloride: 113 mmol/L — ABNORMAL HIGH (ref 98–111)
Creatinine, Ser: 2.02 mg/dL — ABNORMAL HIGH (ref 0.61–1.24)
GFR, Estimated: 32 mL/min — ABNORMAL LOW (ref >60)
Glucose, Bld: 104 mg/dL — ABNORMAL HIGH (ref 70–99)
Potassium: 4.1 mmol/L (ref 3.5–5.1)
Sodium: 144 mmol/L (ref 135–145)
Total Bilirubin: 0.3 mg/dL (ref 0.0–1.2)
Total Protein: 5.1 g/dL — ABNORMAL LOW (ref 6.5–8.1)

## 2024-10-19 LAB — BPAM RBC
Blood Product Expiration Date: 202512282359
ISSUE DATE / TIME: 202511260128
Unit Type and Rh: 5100

## 2024-10-19 LAB — CBC
HCT: 25.6 % — ABNORMAL LOW (ref 39.0–52.0)
Hemoglobin: 7.8 g/dL — ABNORMAL LOW (ref 13.0–17.0)
MCH: 25.1 pg — ABNORMAL LOW (ref 26.0–34.0)
MCHC: 30.5 g/dL (ref 30.0–36.0)
MCV: 82.3 fL (ref 80.0–100.0)
Platelets: 350 K/uL (ref 150–400)
RBC: 3.11 MIL/uL — ABNORMAL LOW (ref 4.22–5.81)
RDW: 18.9 % — ABNORMAL HIGH (ref 11.5–15.5)
WBC: 10.5 K/uL (ref 4.0–10.5)
nRBC: 0 % (ref 0.0–0.2)

## 2024-10-19 LAB — MAGNESIUM: Magnesium: 2.1 mg/dL (ref 1.7–2.4)

## 2024-10-19 MED ORDER — AMOXICILLIN-POT CLAVULANATE 500-125 MG PO TABS
1.0000 | ORAL_TABLET | Freq: Two times a day (BID) | ORAL | Status: DC
  Administered 2024-10-19 – 2024-10-22 (×6): 1 via ORAL
  Filled 2024-10-19 (×7): qty 1

## 2024-10-19 MED ORDER — AMOXICILLIN-POT CLAVULANATE 875-125 MG PO TABS: 1.0000 | ORAL_TABLET | Freq: Two times a day (BID) | ORAL | Status: DC

## 2024-10-19 NOTE — TOC Progression Note (Signed)
 Transition of Care [facility]) - Progression Note    Patient Details  Name: Ralph Leon MRN: [id number] Date of Birth: 05-04-1936  [facility]) CM/SW Contact  Corrie JINNY Ruts, LCSW Phone Number: 10/19/2024, 1:36 PM  Clinical Narrative:    Chart reviewed. Per chart review patient would like to transition to [facility]. SW called Tammy to check bed availability. SW called and left a VM and waiting for a call back.                      Expected Discharge Plan and Services                                               Social Drivers of Health (SDOH) Interventions SDOH Screenings   Food Insecurity: Patient Declined (10/11/2024)  Housing: Unknown (10/11/2024)  Transportation Needs: Patient Declined (10/11/2024)  Utilities: Not At Risk (10/11/2024)  Alcohol Screen: Low Risk  (04/19/2024)  Depression (PHQ2-9): Low Risk  (10/02/2024)  Financial Resource Strain: Low Risk  (09/11/2024)   Received from [facility]  Physical Activity: Insufficiently Active (04/19/2024)  Social Connections: Socially Isolated (10/11/2024)  Stress: No Stress Concern Present (04/19/2024)  Tobacco Use: Medium Risk (10/13/2024)  Health Literacy: Adequate Health Literacy (04/19/2024)    Readmission Risk Interventions     No data to display

## 2024-10-19 NOTE — Progress Notes (Signed)
 PROGRESS NOTE    Ralph Leon  FMW:[id number] DOB: Oct 17, 1936 DOA: 10/10/2024 PCP: [name], NP  224A/[id number]-AA  LOS: 9 days   Brief hospital course:   Assessment & Plan: Ralph Leon is a 88 y.o. patient male with medical history significant for COPD, essential hypertension and CVA, presented to the emergency room with acute onset of coffee-ground emesis with associated melena once today.  And not being on any blood thinners.  He has been feeling weak for the past day.  Denied any abdominal pain but has been having diarrhea and stools have been mostly brown.  He had coffee-ground emesis twice.  He used to drink alcohol in the past.  No chest pain or shortness of breath or cough or wheezing or hemoptysis.  No dysuria, oliguria or hematuria or flank pain.  No other bleeding diathesis.  He has been recommended GI workup before but was hesitant to have it done.  He has been following with oncology for possible underlying malignancy when he was recommended GI workup.    Likely upper GI bleeding Acute blood loss anemia - Presented with coffee-ground emesis and anemia - Seen by GI, appreciate recs - EGD showed esophagitis and Erosive gastropathy.  Pt refused colonoscopy/ further work up - s/p 1u pRBC on 11/26, Hb stable 7.8 - cont protonix  daily, outpatient GI f/u  AKI (acute kidney injury) - likely secondary to significant dehydration from recurrent nausea and vomiting and anorexia.  Cr improving - Now off IV fluids, encourage po intake, Monitor Cr  Acute on chronic respiratory failure with hypoxia - resolved Possible pneumonia - Pt became hypoxic and needed heated HFNC, now down to 2L [place] (baseline) - RVP negative, had fever, procalcitonin elevated, Leukocytosis - CXR shows hazy and patchy basilar opacities, suspicious for pneumonic infiltrate vs atelectasis.  Small new pleural effusions - On augmentin , doxycycline  - Follow blood culture - Continue supplemental O2 to keep sats  >=90% - SLP eval to r/o aspiration  Hypokalemia Hypomagnesemia - Monitor and replete as needed  Transaminitis, resolved - The patient has an AST:ALT ratio higher than 5: 1, alcoholic pattern - acute viral hepatitis panel neg  Dyslipidemia - hold statin  Dementia with behavioral disturbance (HCC) - cont Aricept   Essential hypertension - hold home anti-hypertensives  Hx of alcohol abuse - pt denied recent use  Elevated D-dimer - 15.76.   - VQ scan neg for PE   DVT prophylaxis: SCD/Compression stockings Code Status: DNR  Family Communication:  Level of care: Med-Surg Dispo:   The patient is from: home Anticipated d/c is to: SNF rehab Anticipated d/c date is: 1-2 days   Subjective and Interval History:  Patient was examined at the bedside, reports feeling well Denies any new complaints today.  Changed to p.o. antibiotics Medically stable to discharge to SNF   Objective: Vitals:   10/18/24 1527 10/18/24 2028 10/19/24 0417 10/19/24 1400  BP: 130/71 (!) 130/58 (!) 153/80 (!) 146/74  Pulse: 89 81 94 87  Resp: 20 18 20 14   Temp: 98.4 F (36.9 C) 98.1 F (36.7 C) 98.9 F (37.2 C) 98 F (36.7 C)  TempSrc:  Oral Oral   SpO2: 98% 100% 99% 99%  Weight:      Height:        Intake/Output Summary (Last 24 hours) at 10/19/2024 1655 Last data filed at 10/19/2024 1116 Gross per 24 hour  Intake 240 ml  Output 1200 ml  Net -960 ml   Filed Weights   10/10/24 2028 10/13/24  0957  Weight: 63.5 kg 63.5 kg    Examination:   Constitutional: NAD CV: No cyanosis.   RESP: normal respiratory effort, on 2L   Data Reviewed: I have personally reviewed labs and imaging studies  Time spent: 35 minutes  [name], MD [facility] If 7PM-7AM, please contact night-coverage 10/19/2024, 4:55 PM

## 2024-10-19 NOTE — Progress Notes (Signed)
 PHARMACY NOTE:  ANTIMICROBIAL RENAL DOSAGE ADJUSTMENT  Current antimicrobial regimen includes a mismatch between antimicrobial dosage and estimated renal function.  As per policy approved by the Pharmacy & Therapeutics and Medical Executive Committees, the antimicrobial dosage will be adjusted accordingly.  Current antimicrobial dosage:  augmentin  875 mg po bid  Indication: possible pneumonia  Renal Function:  Estimated Creatinine Clearance: 22 mL/min (A) (by C-G formula based on SCr of 2.02 mg/dL (H)).     Antimicrobial dosage has been changed to:   augmentin  500mg  po bid for Crcl 10 to <30 ml/min  Additional comments:   Thank you for allowing pharmacy to be a part of this patient's care.  Allean Haas PharmD Clinical Pharmacist 10/19/2024

## 2024-10-20 DIAGNOSIS — E43 Unspecified severe protein-calorie malnutrition: Secondary | ICD-10-CM | POA: Insufficient documentation

## 2024-10-20 DIAGNOSIS — N179 Acute kidney failure, unspecified: Secondary | ICD-10-CM | POA: Diagnosis not present

## 2024-10-20 DIAGNOSIS — K922 Gastrointestinal hemorrhage, unspecified: Secondary | ICD-10-CM | POA: Diagnosis not present

## 2024-10-20 LAB — BASIC METABOLIC PANEL WITH GFR
Anion gap: 8 (ref 5–15)
BUN: 32 mg/dL — ABNORMAL HIGH (ref 8–23)
CO2: 25 mmol/L (ref 22–32)
Calcium: 7.5 mg/dL — ABNORMAL LOW (ref 8.9–10.3)
Chloride: 111 mmol/L (ref 98–111)
Creatinine, Ser: 1.75 mg/dL — ABNORMAL HIGH (ref 0.61–1.24)
GFR, Estimated: 37 mL/min — ABNORMAL LOW (ref >60)
Glucose, Bld: 92 mg/dL (ref 70–99)
Potassium: 3.8 mmol/L (ref 3.5–5.1)
Sodium: 145 mmol/L (ref 135–145)

## 2024-10-20 LAB — HEMOGLOBIN: Hemoglobin: 8 g/dL — ABNORMAL LOW (ref 13.0–17.0)

## 2024-10-20 NOTE — Plan of Care (Signed)
  Problem: Clinical Measurements: Goal: Diagnostic test results will improve Outcome: Progressing Goal: Signs and symptoms of infection will decrease Outcome: Progressing   Problem: Education: Goal: Knowledge of General Education information will improve Description: Including pain rating scale, medication(s)/side effects and non-pharmacologic comfort measures Outcome: Progressing   Problem: Clinical Measurements: Goal: Ability to maintain clinical measurements within normal limits will improve Outcome: Progressing Goal: Will remain free from infection Outcome: Progressing Goal: Respiratory complications will improve Outcome: Progressing   Problem: Nutrition: Goal: Adequate nutrition will be maintained Outcome: Progressing   Problem: Coping: Goal: Level of anxiety will decrease Outcome: Progressing   Problem: Pain Managment: Goal: General experience of comfort will improve and/or be controlled Outcome: Progressing   Problem: Safety: Goal: Ability to remain free from injury will improve Outcome: Progressing   Problem: Skin Integrity: Goal: Risk for impaired skin integrity will decrease Outcome: Progressing

## 2024-10-20 NOTE — TOC Progression Note (Signed)
 Transition of Care [facility]) - Progression Note    Patient Details  Name: Freddi Schrager MRN: [id number] Date of Birth: [birth year]  Transition of [facility]) CM/SW Contact  [name] ONEIDA Haddock, RN Phone Number: 10/20/2024, 4:18 PM  Clinical Narrative:      Per chart review patient has accepted bed in [facility].  Message sent to [facility] at [facility] to determine when patient can admit, per MD patient medically stable for DC                   Expected Discharge Plan and Services                                               Social Drivers of Health (SDOH) Interventions SDOH Screenings   Food Insecurity: Patient Declined (10/11/2024)  Housing: Unknown (10/11/2024)  Transportation Needs: Patient Declined (10/11/2024)  Utilities: Not At Risk (10/11/2024)  Alcohol Screen: Low Risk  (04/19/2024)  Depression (PHQ2-9): Low Risk  (10/02/2024)  Financial Resource Strain: Low Risk  (09/11/2024)   Received from [facility]  Physical Activity: Insufficiently Active (04/19/2024)  Social Connections: Socially Isolated (10/11/2024)  Stress: No Stress Concern Present (04/19/2024)  Tobacco Use: Medium Risk (10/13/2024)  Health Literacy: Adequate Health Literacy (04/19/2024)    Readmission Risk Interventions     No data to display

## 2024-10-20 NOTE — Plan of Care (Signed)
   Problem: Respiratory: Goal: Ability to maintain adequate ventilation will improve Outcome: Progressing

## 2024-10-20 NOTE — Evaluation (Addendum)
 Clinical/Bedside Swallow Evaluation Patient Details  Name: Ralph Leon MRN: [id number] Date of Birth: November 30, 1935  Today's Date: 10/20/2024 Time: SLP Start Time (ACUTE ONLY): 1030 SLP Stop Time (ACUTE ONLY): 1115 SLP Time Calculation (min) (ACUTE ONLY): 45 min  Past Medical History:  Past Medical History:  Diagnosis Date   COPD (chronic obstructive pulmonary disease) (HCC)    Hypertension    Stroke Ralph Leon)    Past Surgical History:  Past Surgical History:  Procedure Laterality Date   ESOPHAGOGASTRODUODENOSCOPY N/A 10/13/2024   Procedure: EGD (ESOPHAGOGASTRODUODENOSCOPY);  Surgeon: Ralph Carmine, MD;  Location: [facility] ENDOSCOPY;  Service: Endoscopy;  Laterality: N/A;   HPI:  Per H&P, Ralph Leon is a 88 y.o. patient male with medical history significant for COPD, essential hypertension and CVA, presented to the emergency room with acute onset of coffee-ground emesis with associated melena once today.  And not being on any blood thinners.  He has been feeling weak for the past day.  Denied any abdominal pain but has been having diarrhea and stools have been mostly brown.  He had coffee-ground emesis twice.  He used to drink alcohol in the past.  No chest pain or shortness of breath or cough or wheezing or hemoptysis.  No dysuria, oliguria or hematuria or flank pain.  No other bleeding diathesis.  He has been recommended GI workup before but was hesitant to have it done.  He has been following with Oncology for possible underlying malignancy when he was recommended GI workup. Tentative plan for EGD, 10/13/24.  Pt has been tolerating a pureed diet w/ thin liquids this admit but ate regular foods PTA per pt report.     CXR on admit, negative for acute findings.  CXR 11/25: Hazy and patchy basilar opacities, suspicious for pneumonic infiltrates  versus atelectasis or a combination.  2. Small new pleural effusions.  Pt has been more bedbound since admit; weak.    Assessment / Plan /  Recommendation  Clinical Impression   Pt seen for a repeat BSE today per MD request. Pt awake, verbally engaged and answered general questions re: self. Mildly hypophonic but made wants/needs known. Pt is Missing many Dentition.  Pt's GI and pulmonary status have improved per chart notes and previous needs; pt is now on only 2L O2 via [place]. This has been a lengthy illness/hospitalization for pt; GI has followed. Afebrile and WBC WNL.   Pt appears to present w/ grossly functional oropharyngeal phase swallowing in setting of Missing Many Dentition w/ No overt oropharyngeal phase dysphagia noted, No neuromuscular deficits noted. Pt consumed po trials w/ No overt, clinical s/s of aspiration during po trials.  Pt appears at reduced risk for aspiration following general aspiration precautions. However, pt does have challenging factors that could impact oropharyngeal swallowing to include deconditioning/weakness, admit for emesis/GI issues, lengthy illness. and Missing Dentition. Also, w/ any Esophageal phase Dysmotility, ANY dysmotility or Regurgitation of Reflux material can increase risk for aspiration of the Reflux material during Retrograde flow thus impact Voicing and Pulmonary status. These factors can increase risk for dysphagia as well as decreased oral intake overall.   During po trials, pt consumed all consistencies w/ no overt coughing, decline in vocal quality, or change in respiratory presentation during/post trials. O2 sats 97-98%. Oral phase appeared grossly [facility] w/ timely bolus management, mastication/mashing, and control of bolus propulsion for A-P transfer for swallowing. Oral clearing achieved w/ all trial consistencies w/ Time -- moistened, soft foods given. OM Exam appeared [facility] w/ no unilateral  weakness noted. Speech Clear; low volume. Pt fed self w/ setup support.   Recommend a more Mech Soft consistency diet w/ well-Cut or Minced meats, moistened foods; Thin liquids -- general aspiration  precautions including small bites/sips, eating/drinking slowly and chew foods well. Must sit fully upright for oral intake. Tray setup and support at meals. Pills WHOLE in Puree for safer, easier swallowing as needed.   Education given on Pills in Puree if needed; food consistencies and easy to eat/chew options; food prep; general aspiration and REFLUX precautions to pt. No further Acute needs indicated. NSG to reconsult if any new needs arise. NSG updated, agreed. MD updated. Recommend Dietician and Palliative Care f/u for support. Precautions posted in room, chart. SLP Visit Diagnosis: Dysphagia, unspecified (R13.10) (Missing Dentition; generalized weakness/deconditioning d/t lengthy illness)    Aspiration Risk   (reduced following general precautions)    Diet Recommendation   Thin;Dysphagia 3 (mechanical soft) (meats Minced; gravies to moisten foods) = a more Mech Soft consistency diet w/ well-Cut or Minced meats, moistened foods; Thin liquids -- general aspiration precautions including small bites/sips, eating/drinking slowly and chew foods well. Must sit fully upright for oral intake. Tray setup and support at meals.   Medication Administration: Whole meds with liquid (vs in a Puree if needed)    Other  Recommendations Recommended Consults:  (Dietician and Palliative Care for support) Oral Care Recommendations: Oral care BID;Patient independent with oral care (setup)     Assistance Recommended at Discharge  Intermittent at meals  Functional Status Assessment Patient has not had a recent decline in their functional status  Frequency and Duration  (n/a)   (n/a)       Prognosis Prognosis for improved oropharyngeal function: Good Barriers/Prognosis Comment: Missing Dentition; generalized weakness/deconditioning d/t lengthy illness      Swallow Study   General Date of Onset: 10/10/24 HPI: Per H&P, Ralph Leon is a 88 y.o. patient male with medical history significant for COPD,  essential hypertension and CVA, presented to the emergency room with acute onset of coffee-ground emesis with associated melena once today.  And not being on any blood thinners.  He has been feeling weak for the past day.  Denied any abdominal pain but has been having diarrhea and stools have been mostly brown.  He had coffee-ground emesis twice.  He used to drink alcohol in the past.  No chest pain or shortness of breath or cough or wheezing or hemoptysis.  No dysuria, oliguria or hematuria or flank pain.  No other bleeding diathesis.  He has been recommended GI workup before but was hesitant to have it done.  He has been following with oncology for possible underlying malignancy when he was recommended GI workup. Tentative plan for EGD, 10/13/24.  Pt has been tolerating a pureed diet w/ thin liquids this admit but ate regular foods PTA per pt report.    CXR on admit, negative for acute findings.  CXR 11/25: Hazy and patchy basilar opacities, suspicious for pneumonic infiltrates  versus atelectasis or a combination.  2. Small new pleural effusions.  Pt has been more bedbound since admit. Type of Study: Bedside Swallow Evaluation Previous Swallow Assessment: this admit- see note Diet Prior to this Study: Dysphagia 1 (pureed);Thin liquids (Level 0) Temperature Spikes Noted: No (wbc 10.5) Respiratory Status: Nasal cannula (2L) History of Recent Intubation: No Behavior/Cognition: Alert;Cooperative;Pleasant mood;Requires cueing (min) Oral Cavity Assessment: Within Functional Limits Oral Care Completed by SLP: Yes Oral Cavity - Dentition: Missing dentition (Several) Vision: Functional  for self-feeding Self-Feeding Abilities: Able to feed self;Needs set up Patient Positioning: Upright in bed (full assist) Baseline Vocal Quality: Low vocal intensity Volitional Cough: Strong (>Fair) Volitional Swallow: Able to elicit    Oral/Motor/Sensory Function Overall Oral Motor/Sensory Function: Within functional  limits   Ice Chips Ice chips: Not tested   Thin Liquid Thin Liquid: Within functional limits Presentation: Self Fed;Straw (12+ trials) Other Comments: water, coffee, choc. milk    Nectar Thick Nectar Thick Liquid: Not tested   Honey Thick Honey Thick Liquid: Not tested   Puree Puree: Within functional limits Presentation: Self Fed;Spoon (10+ trials)   Solid     Solid: Within functional limits (lacking Dentition- wfl) Presentation: Self Fed;Spoon (10 boluses) Other Comments: moistened foods for ease of chewing        Comer Portugal, MS, CCC-SLP Speech Language Pathologist Rehab Services; [facility] [place] [phone] (ascom) Joseandres Mazer 10/20/2024,5:27 PM

## 2024-10-20 NOTE — Progress Notes (Signed)
 PROGRESS NOTE    Ralph Leon  FMW:[id number] DOB: May 22, 1936 DOA: 10/10/2024 PCP: [name], NP  [id number]  LOS: 10 days   Brief hospital course:   Assessment & Plan: Ralph Leon is a 88 y.o. patient male with medical history significant for COPD, essential hypertension and CVA, presented to the emergency room with acute onset of coffee-ground emesis with associated melena once today.  And not being on any blood thinners.  He has been feeling weak for the past day.  Denied any abdominal pain but has been having diarrhea and stools have been mostly brown.  He had coffee-ground emesis twice.  He used to drink alcohol in the past.  No chest pain or shortness of breath or cough or wheezing or hemoptysis.  No dysuria, oliguria or hematuria or flank pain.  No other bleeding diathesis.  He has been recommended GI workup before but was hesitant to have it done.  He has been following with oncology for possible underlying malignancy when he was recommended GI workup.    Likely upper GI bleeding Acute blood loss anemia - Presented with coffee-ground emesis and anemia - Seen by GI, appreciate recs - EGD showed esophagitis and Erosive gastropathy.  Pt refused colonoscopy/ further work up - s/p 1u pRBC on 11/26, Hb stable - cont protonix  daily, outpatient GI f/u  AKI (acute kidney injury) - likely secondary to significant dehydration from recurrent nausea and vomiting and anorexia.  Cr improving - Now off IV fluids, encourage po intake, Monitor Cr  Acute on chronic respiratory failure with hypoxia - resolved Possible pneumonia - Pt became hypoxic and needed heated HFNC, now down to 2L [place] (baseline) - RVP negative, had fever, procalcitonin elevated, Leukocytosis - CXR shows hazy and patchy basilar opacities, suspicious for pneumonic infiltrate vs atelectasis.  Small new pleural effusions - On augmentin , doxycycline  - Follow blood culture - Continue supplemental O2 to keep sats  >=90% - Seen by SLP, appreciate recs  Hypokalemia Hypomagnesemia - Monitor and replete as needed  Transaminitis, resolved - The patient has an AST:ALT ratio higher than 5: 1, alcoholic pattern - acute viral hepatitis panel neg  Dyslipidemia - hold statin  Dementia with behavioral disturbance (HCC) - cont Aricept   Essential hypertension - hold home anti-hypertensives  Hx of alcohol abuse - pt denied recent use  Elevated D-dimer - 15.76.   - VQ scan neg for PE  Severe Malnutrition - Seen by RD   DVT prophylaxis: SCD/Compression stockings Code Status: DNR  Family Communication:  Level of care: Med-Surg Dispo:   The patient is from: home Anticipated d/c is to: SNF rehab Anticipated d/c date is: 1-2 days   Subjective and Interval History:  Patient was examined at the bedside, reports feeling well Denies any new complaints today. Medically ready, discharged to SNF when bed available   Objective: Vitals:   10/20/24 0718 10/20/24 0718 10/20/24 0835 10/20/24 1625  BP: (!) 149/69 (!) 149/69 (!) 147/68 (!) 147/69  Pulse: 85 86 86 82  Resp: 16 16 17 17   Temp: 97.7 F (36.5 C) 97.7 F (36.5 C) 97.9 F (36.6 C) 98.1 F (36.7 C)  TempSrc:      SpO2: 100% 100% 100% 100%  Weight:      Height:        Intake/Output Summary (Last 24 hours) at 10/20/2024 1641 Last data filed at 10/20/2024 1300 Gross per 24 hour  Intake 505.83 ml  Output 1700 ml  Net -1194.17 ml   [employer]  10/10/24 2028 10/13/24 0957  Weight: 63.5 kg 63.5 kg    Examination:   Constitutional: NAD CV: No cyanosis.   RESP: normal respiratory effort, on 2L   Data Reviewed: I have personally reviewed labs and imaging studies  Time spent: 35 minutes  [name], MD [facility] If 7PM-7AM, please contact night-coverage 10/20/2024, 4:41 PM

## 2024-10-20 NOTE — Progress Notes (Signed)
 Nutrition Follow-up  DOCUMENTATION CODES:   Severe malnutrition in context of chronic illness  INTERVENTION:   -Continue dysphagia 3 diet with thin liquids -D/c Ensure Plus High Protein po BID, each supplement provides 350 kcal and 20 grams of protein  -Magic cup TID with meals, each supplement provides 290 kcal and 9 grams of protein  -MVI with minerals daily   NUTRITION DIAGNOSIS:   Severe Malnutrition related to chronic illness (COPD) as evidenced by moderate fat depletion, severe fat depletion, moderate muscle depletion, severe muscle depletion.  Ongoing  GOAL:   Patient will meet greater than or equal to 90% of their needs  Progressing   MONITOR:   PO intake, Supplement acceptance, Diet advancement  REASON FOR ASSESSMENT:   Consult Assessment of nutrition requirement/status  ASSESSMENT:   88 y.o. male with PMH significant for COPD, essential hypertension and CVA, who presented with acute onset of coffee-ground emesis with associated melena. Admitted for GI bleed.  11/21- s/p EGD- revealed LA Grade A reflux esophagitis with no bleeding, erosive gastropathy with no bleeding and no stigmata of recent bleeding, Erythematous duodenopathy  11/25- downgraded to dysphagia 1 diet per MD request 11/28- s/p BSE- dysphagia 3 diet with thin liquids  Reviewed I/O's: -754 ml x 24 hours and +1.8 L since admission  UOP: 1.3 L x 24 hours  Spoke with patient at bedside, who was pleasant and in good spirits today. He reports feeling better today. Of note, patient is soft spoken and difficult to understand at times, however, able to communicate needs. He reports that he is feeling better and eager to discharge to SNF rehab, so I can eventually go home.   Patient reports a general decline in health over the past month. He mentions that he lives by himself at baseline, but recently moved in with his sister. He usually consumed 2-3 meals per day (Breakfast: fruit tart and fruit with  coffee OR eggs, bacon, and toast with coffee; Lunch: sandwich; Dinner: sandwich). Patient reports that he has not difficulty tolerating the dysphagia 1 diet; clarified that SLP evaluated and upgraded to him to dysphagia 3 diet with thin liquids. Patient is thankful for this due to expanded meal options. Noted meal completions 25-100%. Patient consumed about 50% of his breakfast this morning (100% of puree waffle, puree fruit, and apple juice; patient did not consumed puree eggs of grits).   Patient reports he has lost weight, but is unsure how much. He reports his UBW is around 150#, but now I'm about 130#. He is unsure when he started losing weight. Per CareEverywhere; weight was 137# on 09/21/23; suspect weight loss is likely more distant. No new weight recorded since last visit.   Discussed importance of good meal and supplement intake to promote healing. Patient shares he does not like the flavor of Ensure supplements, but is wiling to try [employer].   Medications reviewed and include protonix  and MVI.   Per TOC notes, plan to discharge to SNF at discharge.   Labs reviewed.    NUTRITION - FOCUSED PHYSICAL EXAM:  Flowsheet Row Most Recent Value  Orbital Region Moderate depletion  Upper Arm Region Severe depletion  Thoracic and Lumbar Region Severe depletion  Buccal Region Moderate depletion  Temple Region Severe depletion  Clavicle Bone Region Severe depletion  Clavicle and Acromion Bone Region Severe depletion  Scapular Bone Region Severe depletion  Dorsal Hand Moderate depletion  Patellar Region Severe depletion  Anterior Thigh Region Severe depletion  Posterior Calf Region Severe  depletion  Edema (RD Assessment) None  Hair Reviewed  Eyes Reviewed  Mouth Reviewed  Skin Reviewed  Nails Reviewed    Diet Order:   Diet Order             DIET DYS 3 Room service appropriate? Yes with Assist; Fluid consistency: Thin  Diet effective now                   EDUCATION  NEEDS:   Education needs have been addressed  Skin:  Skin Assessment: Skin Integrity Issues: Skin Integrity Issues:: Stage I Stage I: Sacrum  Last BM:  10/19/24 (type 3)  Height:   Ht Readings from Last 1 Encounters:  10/13/24 5' 4 (1.626 m)    Weight:   Wt Readings from Last 1 Encounters:  10/13/24 63.5 kg    Ideal Body Weight:  59.1 kg  BMI:  Body mass index is 24.03 kg/m.  Estimated Nutritional Needs:   Kcal:  1600-1800  Protein:  85-100 grams  Fluid:  1.6-1.8 L    Margery ORN, RD, LDN, CDCES Registered Dietitian III Certified Diabetes Care and Education Specialist If unable to reach this RD, please use RD Inpatient group chat on secure chat between hours of 8am-4 pm daily

## 2024-10-21 DIAGNOSIS — N179 Acute kidney failure, unspecified: Secondary | ICD-10-CM | POA: Diagnosis not present

## 2024-10-21 DIAGNOSIS — K922 Gastrointestinal hemorrhage, unspecified: Secondary | ICD-10-CM | POA: Diagnosis not present

## 2024-10-21 LAB — BASIC METABOLIC PANEL WITH GFR
Anion gap: 7 (ref 5–15)
BUN: 27 mg/dL — ABNORMAL HIGH (ref 8–23)
CO2: 27 mmol/L (ref 22–32)
Calcium: 7.8 mg/dL — ABNORMAL LOW (ref 8.9–10.3)
Chloride: 111 mmol/L (ref 98–111)
Creatinine, Ser: 1.51 mg/dL — ABNORMAL HIGH (ref 0.61–1.24)
GFR, Estimated: 45 mL/min — ABNORMAL LOW (ref >60)
Glucose, Bld: 107 mg/dL — ABNORMAL HIGH (ref 70–99)
Potassium: 3.8 mmol/L (ref 3.5–5.1)
Sodium: 145 mmol/L (ref 135–145)

## 2024-10-21 NOTE — Progress Notes (Signed)
 PROGRESS NOTE    Ralph Leon  FMW:[id number] DOB: April 30, 1936 DOA: 10/10/2024 PCP: [name], NP  [id number]  LOS: 11 days   Brief hospital course:   Assessment & Plan: Ralph Leon is a 88 y.o. patient male with medical history significant for COPD, essential hypertension and CVA, presented to the emergency room with acute onset of coffee-ground emesis with associated melena once today.  And not being on any blood thinners.  He has been feeling weak for the past day.  Denied any abdominal pain but has been having diarrhea and stools have been mostly brown.  He had coffee-ground emesis twice.  He used to drink alcohol in the past.  No chest pain or shortness of breath or cough or wheezing or hemoptysis.  No dysuria, oliguria or hematuria or flank pain.  No other bleeding diathesis.  He has been recommended GI workup before but was hesitant to have it done.  He has been following with oncology for possible underlying malignancy when he was recommended GI workup.    Likely upper GI bleeding Acute blood loss anemia - Presented with coffee-ground emesis and anemia - Seen by GI, appreciate recs - EGD showed esophagitis and Erosive gastropathy.  Pt refused colonoscopy/ further work up - s/p 1u pRBC on 11/26, Hb stable - cont protonix  daily, outpatient GI f/u  AKI (acute kidney injury) - likely secondary to significant dehydration from recurrent nausea and vomiting and anorexia.  Cr improving - Now off IV fluids, encourage po intake, Monitor Cr  Acute on chronic respiratory failure with hypoxia - resolved Possible pneumonia - Pt became hypoxic and needed heated HFNC, now down to 2L [place] (baseline) - RVP negative, had fever, procalcitonin elevated, Leukocytosis - CXR shows hazy and patchy basilar opacities, suspicious for pneumonic infiltrate vs atelectasis.  Small new pleural effusions - On augmentin , doxycycline  - Follow blood culture - Continue supplemental O2 to keep sats  >=90% - Seen by SLP, appreciate recs  Hypokalemia Hypomagnesemia - Monitor and replete as needed  Transaminitis, resolved - The patient has an AST:ALT ratio higher than 5: 1, alcoholic pattern - acute viral hepatitis panel neg  Dyslipidemia - hold statin  Dementia with behavioral disturbance (HCC) - cont Aricept   Essential hypertension - hold home anti-hypertensives  Hx of alcohol abuse - pt denied recent use  Elevated D-dimer - 15.76.   - VQ scan neg for PE  Severe Malnutrition - Seen by RD   DVT prophylaxis: SCD/Compression stockings Code Status: DNR  Family Communication:  Level of care: Med-Surg Dispo:   The patient is from: home Anticipated d/c is to: SNF rehab Anticipated d/c date is: 1-2 days   Subjective and Interval History:  Patient was examined at the bedside, reports having some nausea Denies abdominal pain.  Asked RN to give Zofran  Medically ready, discharged to SNF when bed available   Objective: Vitals:   10/21/24 0439 10/21/24 0827 10/21/24 1459 10/21/24 1459  BP: (!) 141/67 (!) 149/74 126/66 126/66  Pulse: 76 86 87 84  Resp: 16 16 16 16   Temp: 98.1 F (36.7 C) 97.7 F (36.5 C) 97.9 F (36.6 C) 97.9 F (36.6 C)  TempSrc:  Oral Oral Oral  SpO2: 100% 98% 98% 97%  Weight:      Height:        Intake/Output Summary (Last 24 hours) at 10/21/2024 1507 Last data filed at 10/21/2024 1300 Gross per 24 hour  Intake 180 ml  Output 700 ml  Net -520 ml  Filed Weights   10/10/24 2028 10/13/24 0957  Weight: 63.5 kg 63.5 kg    Examination:   Constitutional: NAD CV: No cyanosis.   RESP: normal respiratory effort, on 2L   Data Reviewed: I have personally reviewed labs and imaging studies  Time spent: 35 minutes  Ralph Lock, MD [facility] If 7PM-7AM, please contact night-coverage 10/21/2024, 3:07 PM

## 2024-10-21 NOTE — Progress Notes (Signed)
 Physical Therapy Treatment Patient Details Name: Ralph Leon MRN: [id number] DOB: [birth year] Today's Date: 10/21/2024   History of Present Illness Patient is a 88 year old male with acute onset of coffee-ground emesis with associated melena, possible GI bleed. EFY:RNEI, essential hypertension and CVA    PT Comments  Pt is progressing slowly with increase gait distance this session and performing basic mobility at an overall Min-CGA level. Pt continues to experience limitations to mobility, demonstrating decreased activity tolerance(pt reported feeling nauseous during session), poor balance, general weakness and SOB with activity (pt was able to keep SPO2 92-98% with activity during this session).  Continue to recommend post acute rehab <3 hours therapy/day upon d/c.      If plan is discharge home, recommend the following: A little help with bathing/dressing/bathroom;Assistance with cooking/housework;A lot of help with walking and/or transfers   Can travel by private vehicle     Yes  Equipment Recommendations  None recommended by PT    Recommendations for Other Services       Precautions / Restrictions Precautions Precautions: Fall Recall of Precautions/Restrictions: Intact Restrictions Weight Bearing Restrictions Per Provider Order: No     Mobility  Bed Mobility Overal bed mobility: Needs Assistance Bed Mobility: Supine to Sit     Supine to sit: HOB elevated, Supervision     General bed mobility comments: cues for technique    Transfers Overall transfer level: Needs assistance Equipment used: Rolling walker (2 wheels) Transfers: Sit to/from Stand, Bed to chair/wheelchair/BSC Sit to Stand: Min assist   Step pivot transfers: Min assist       General transfer comment: steadying assistance for standing    Ambulation/Gait Ambulation/Gait assistance: Contact guard assist Gait Distance (Feet): 18 Feet Assistive device: Rolling walker (2 wheels) Gait  Pattern/deviations: Step-through pattern, Decreased step length - right, Decreased step length - left, Trunk flexed, Narrow base of support       General Gait Details: SOB at mid point for gait, fatigued, on RA, SPO2 92-98% with activity.  Notified RN and left pt on RA at end of session.   Stairs             Wheelchair Mobility     Tilt Bed    Modified Rankin (Stroke Patients Only)       Balance Overall balance assessment: Needs assistance Sitting-balance support: Feet supported, No upper extremity supported Sitting balance-Leahy Scale: Fair Sitting balance - Comments: no imbalance low level dynamic inside BOS.   Standing balance support: Bilateral upper extremity supported, Reliant on assistive device for balance Standing balance-Leahy Scale: Fair Standing balance comment: external support required or Min A without external support with dynamic balance activity within BOS                            Communication Communication Communication: Impaired Factors Affecting Communication: Difficulty expressing self  Cognition Arousal: Alert Behavior During Therapy: WFL for tasks assessed/performed, Flat affect   PT - Cognitive impairments: Difficult to assess Difficult to assess due to: Impaired communication                     PT - Cognition Comments: very soft spoken and difficult to understand at times, appropriate during session Following commands: Impaired Following commands impaired: Follows one step commands with increased time    Cueing Cueing Techniques: Verbal cues  Exercises      General Comments        Pertinent  Vitals/Pain Pain Assessment Pain Assessment: Faces Faces Pain Scale: Hurts little more Pain Location: stomach, reports feeling nauseous Pain Intervention(s): Monitored during session, Limited activity within patient's tolerance    Home Living                          Prior Function            PT  Goals (current goals can now be found in the care plan section) Acute Rehab PT Goals Patient Stated Goal: none stated PT Goal Formulation: With patient Time For Goal Achievement: 10/26/24 Potential to Achieve Goals: Fair Progress towards PT goals: Progressing toward goals    Frequency    Min 2X/week      PT Plan      Co-evaluation              AM-PAC PT 6 Clicks Mobility   Outcome Measure  Help needed turning from your back to your side while in a flat bed without using bedrails?: A Little Help needed moving from lying on your back to sitting on the side of a flat bed without using bedrails?: A Little Help needed moving to and from a bed to a chair (including a wheelchair)?: A Little Help needed standing up from a chair using your arms (e.g., wheelchair or bedside chair)?: A Little Help needed to walk in hospital room?: A Little Help needed climbing 3-5 steps with a railing? : A Lot 6 Click Score: 17    End of Session Equipment Utilized During Treatment: Gait belt Activity Tolerance: Patient tolerated treatment well;Patient limited by fatigue Patient left: in chair;with call bell/phone within reach;with chair alarm set Nurse Communication: Mobility status PT Visit Diagnosis: Muscle weakness (generalized) (M62.81);Unsteadiness on feet (R26.81)     Time: 8854-8787 PT Time Calculation (min) (ACUTE ONLY): 27 min  Charges:    $Gait Training: 8-22 mins $Therapeutic Activity: 8-22 mins PT General Charges $$ ACUTE PT VISIT: 1 Visit                     Harland Irving, PTA  10/21/24, 12:29 PM

## 2024-10-21 NOTE — Plan of Care (Signed)
   Problem: Nutrition: Goal: Adequate nutrition will be maintained Outcome: Progressing

## 2024-10-22 DIAGNOSIS — N179 Acute kidney failure, unspecified: Secondary | ICD-10-CM | POA: Diagnosis not present

## 2024-10-22 DIAGNOSIS — K922 Gastrointestinal hemorrhage, unspecified: Secondary | ICD-10-CM | POA: Diagnosis not present

## 2024-10-22 LAB — CULTURE, BLOOD (ROUTINE X 2)
Culture: NO GROWTH
Culture: NO GROWTH
Special Requests: ADEQUATE
Special Requests: ADEQUATE

## 2024-10-22 LAB — BASIC METABOLIC PANEL WITH GFR
Anion gap: 8 (ref 5–15)
BUN: 23 mg/dL (ref 8–23)
CO2: 28 mmol/L (ref 22–32)
Calcium: 7.7 mg/dL — ABNORMAL LOW (ref 8.9–10.3)
Chloride: 109 mmol/L (ref 98–111)
Creatinine, Ser: 1.46 mg/dL — ABNORMAL HIGH (ref 0.61–1.24)
GFR, Estimated: 47 mL/min — ABNORMAL LOW (ref >60)
Glucose, Bld: 110 mg/dL — ABNORMAL HIGH (ref 70–99)
Potassium: 3.5 mmol/L (ref 3.5–5.1)
Sodium: 145 mmol/L (ref 135–145)

## 2024-10-22 LAB — MAGNESIUM: Magnesium: 1.4 mg/dL — ABNORMAL LOW (ref 1.7–2.4)

## 2024-10-22 LAB — HEMOGLOBIN: Hemoglobin: 8.2 g/dL — ABNORMAL LOW (ref 13.0–17.0)

## 2024-10-22 MED ORDER — DOXYCYCLINE HYCLATE 100 MG PO TABS: 100.0000 mg | ORAL_TABLET | Freq: Two times a day (BID) | ORAL | Status: DC

## 2024-10-22 MED ORDER — AMOXICILLIN-POT CLAVULANATE 500-125 MG PO TABS
1.0000 | ORAL_TABLET | Freq: Two times a day (BID) | ORAL | Status: DC
  Filled 2024-10-22: qty 1

## 2024-10-22 MED ORDER — MAGNESIUM SULFATE 2 GM/50ML IV SOLN
2.0000 g | Freq: Once | INTRAVENOUS | Status: AC
  Administered 2024-10-22: 2 g via INTRAVENOUS
  Filled 2024-10-22: qty 50

## 2024-10-22 MED ORDER — MAGNESIUM OXIDE 400 MG PO TABS: 400.0000 mg | ORAL_TABLET | Freq: Every day | ORAL | 0 refills | Status: AC

## 2024-10-22 MED ORDER — DOXYCYCLINE HYCLATE 100 MG PO TABS: 100.0000 mg | ORAL_TABLET | Freq: Two times a day (BID) | ORAL | 0 refills | Status: AC

## 2024-10-22 MED ORDER — PROCHLORPERAZINE MALEATE 5 MG PO TABS: 5.0000 mg | ORAL_TABLET | Freq: Four times a day (QID) | ORAL | 0 refills | Status: AC | PRN

## 2024-10-22 MED ORDER — POTASSIUM CHLORIDE CRYS ER 20 MEQ PO TBCR
20.0000 meq | EXTENDED_RELEASE_TABLET | Freq: Once | ORAL | Status: AC
  Administered 2024-10-22: 20 meq via ORAL
  Filled 2024-10-22: qty 1

## 2024-10-22 MED ORDER — PANTOPRAZOLE SODIUM 40 MG PO TBEC: 40.0000 mg | DELAYED_RELEASE_TABLET | Freq: Every day | ORAL | Status: DC

## 2024-10-22 MED ORDER — AMOXICILLIN-POT CLAVULANATE 500-125 MG PO TABS: 1.0000 | ORAL_TABLET | Freq: Two times a day (BID) | ORAL | 0 refills | Status: AC

## 2024-10-22 NOTE — Discharge Summary (Signed)
 Physician Discharge Summary   Patient: Ralph Leon MRN: [id number] DOB: 05-09-1936  Admit date:     10/10/2024  Discharge date: 10/22/24  Discharge Physician: [name]   PCP: [name], NP   Recommendations at discharge:   Follow-up with provider at SNF -repeat BMP, CBC, mag in 5 days Lisinopril  -HCTZ on hold - can resume once AKI resolves and BP tolerates  Follow-up with GI outpatient On 2 L oxygen at home, currently weaned off  Discharge Diagnoses: Principal Problem:   GI bleeding Active Problems:   Transaminitis   AKI (acute kidney injury)   Sepsis due to undetermined organism (HCC)   Acute respiratory failure with hypoxia (HCC)   Dyslipidemia   Essential hypertension   Dementia with behavioral disturbance (HCC)   Aspiration pneumonitis (HCC)   Hematemesis without nausea   Protein-calorie malnutrition, severe   Hospital Course: Ralph Leon is a 88 y.o. patient male with medical history significant for COPD, essential hypertension and CVA, presented to the emergency room with acute onset of coffee-ground emesis with associated melena x 1 episode.  And not being on any blood thinners.  He has been feeling weak for the past day.  Denied any abdominal pain but has been having diarrhea and stools have been mostly brown.  He had coffee-ground emesis twice.  He used to drink alcohol in the past.  He has been recommended GI workup before but was hesitant to have it done.  He has been following with oncology for possible underlying malignancy when he was recommended GI workup. Hospital course as below  Assessment and Plan: Likely upper GI bleeding Acute blood loss anemia - Presented with coffee-ground emesis and anemia - Seen by GI - EGD showed esophagitis and Erosive gastropathy.  Pt refused colonoscopy/ further work up - s/p 1u pRBC on 11/26, Hb stable - cont protonix  daily, outpatient GI f/u   AKI (acute kidney injury) - improving - likely secondary to  significant dehydration from recurrent nausea and vomiting and anorexia.  Cr improving - Now off IV fluids, encourage po intake, Monitor Cr   Acute on chronic respiratory failure with hypoxia - resolved Community acquired pneumonia - On admission, hypoxic and needed heated HFNC, now on RA, chronically uses 2L [place] - CXR shows hazy and patchy basilar opacities, suspicious for pneumonic infiltrate vs atelectasis.  Small new pleural effusions - complete course augmentin , doxycycline  - Seen by SLP, appreciate recs   Hypokalemia - repleted Hypomagnesemia - Repleted and discharged on p.o. magnesium    Transaminitis, resolved - The patient has an AST:ALT ratio higher than 5: 1, alcoholic pattern - acute viral hepatitis panel neg   Dyslipidemia - cont statin   Dementia with behavioral disturbance (HCC) - cont Aricept    Essential hypertension - hold Lisinopril -hydrochlorothiazide  until AKI resolves - Resume as BP tolerates   Hx of alcohol abuse - pt denied recent use   Severe Malnutrition - Seen by RD     Pain control - [place]  Controlled Substance Reporting System database was reviewed. and patient was instructed, not to drive, operate heavy machinery, perform activities at heights, swimming or participation in water activities or provide baby-sitting services while on Pain, Sleep and Anxiety Medications; until their outpatient Physician has advised to do so again. Also recommended to not to take more than prescribed Pain, Sleep and Anxiety Medications.  Consultants: Gastroenterology Procedures performed: EGD  Disposition: Skilled nursing facility Diet recommendation:  Discharge Diet Orders (From admission, onward)     Start  Ordered   10/22/24 0000  Diet - low sodium heart healthy        10/22/24 1323           MINCED meats w/ Gravies.  May have baked/sweet potatoes w/ butters  Dysphagia 3  DISCHARGE MEDICATION: Allergies as of 10/22/2024   No Known  Allergies      Medication List     PAUSE taking these medications    hydrochlorothiazide  12.5 MG tablet Wait to take this until your doctor or other care provider tells you to start again. Commonly known as: HYDRODIURIL  Take 12.5 mg by mouth daily.   lisinopril  20 MG tablet Wait to take this until your doctor or other care provider tells you to start again. Commonly known as: ZESTRIL  Take 1 tablet by mouth once daily       TAKE these medications    albuterol  108 (90 Base) MCG/ACT inhaler Commonly known as: VENTOLIN  HFA INHALE 2 PUFFS BY MOUTH EVERY 6 HOURS AS NEEDED FOR WHEEZING FOR SHORTNESS OF BREATH   amoxicillin -clavulanate 500-125 MG tablet Commonly known as: AUGMENTIN  Take 1 tablet by mouth every 12 (twelve) hours for 5 doses.   aspirin  EC 81 MG tablet Take 1 tablet (81 mg total) by mouth daily. Swallow whole.   atorvastatin  40 MG tablet Commonly known as: LIPITOR Take 1 tablet by mouth once daily   donepezil  5 MG tablet Commonly known as: ARICEPT  Take 5 mg by mouth at bedtime.   doxycycline  100 MG tablet Commonly known as: VIBRA -TABS Take 1 tablet (100 mg total) by mouth every 12 (twelve) hours for 2 doses.   magnesium  oxide 400 MG tablet Commonly known as: MAG-OX Take 1 tablet (400 mg total) by mouth daily for 7 days.   pantoprazole  40 MG tablet Commonly known as: PROTONIX  Take 1 tablet (40 mg total) by mouth daily. Start taking on: October 23, 2024   prochlorperazine 5 MG tablet Commonly known as: COMPAZINE Take 1 tablet (5 mg total) by mouth every 6 (six) hours as needed for nausea or vomiting.   vitamin D3 25 MCG tablet Commonly known as: CHOLECALCIFEROL Take 1,000 Units by mouth daily.        Contact information for after-discharge care     Destination     [facility] [place], [place]. [place]   Service: Skilled Nursing Contact information: [address] [place]  [phone]                     Discharge Exam: Filed Weights   10/10/24 2028 10/13/24 0957  Weight: 63.5 kg 63.5 kg   Constitutional: NAD CV: No cyanosis.   RESP: normal respiratory effort  Condition at discharge: good  The results of significant diagnostics from this hospitalization (including imaging, microbiology, ancillary and laboratory) are listed below for reference.   Imaging Studies: DG Chest Port 1 View Result Date: 10/17/2024 EXAM: 1 VIEW XRAY OF THE CHEST 10/17/2024 05:14:00 PM COMPARISON: Portable chest 10/10/2024. CLINICAL HISTORY: Fever, unknown origin. FINDINGS: LUNGS AND PLEURA: The lungs are emphysematous with bi-apical scarring change. There are small new pleural effusions. Hazy and patchy opacity in the base of the lungs could indicate pneumonic infiltrates, atelectasis, or a combination. The remaining lungs are clear. No pneumothorax. HEART AND MEDIASTINUM: The cardiac size is normal. Mediastinum is stable with aortic tortuosity and atherosclerosis. BONES AND SOFT TISSUES: No acute osseous abnormality. IMPRESSION: 1. Hazy and patchy basilar opacities, suspicious for pneumonic infiltrates versus atelectasis or a combination. 2.  Small new pleural effusions. Electronically signed by: Francis Quam MD 10/17/2024 08:13 PM EST RP Workstation: [id number]   NM Pulmonary Perfusion Result Date: 10/12/2024 CLINICAL DATA:  Hypoxia. Elevated D-dimer levels. Patient reports falling 2-3 days ago. EXAM: NUCLEAR MEDICINE PERFUSION LUNG SCAN TECHNIQUE: Perfusion images were obtained in multiple projections after intravenous injection of radiopharmaceutical. No ventilation imaging performed. RADIOPHARMACEUTICALS:  4.02 mCi Tc-41m MAA IV COMPARISON:  Chest radiographs 10/10/2024.  Chest CT 03/08/2020. FINDINGS: Patchy pulmonary perfusion with multiple small perfusion defects bilaterally, primarily at the lung apices. This is likely secondary to chronic obstructive pulmonary disease in this patient with severe emphysema on  prior CT. There are no which shaped perfusion defects to strongly suggest pulmonary embolism. IMPRESSION: 1. Multiple small perfusion defects in both lungs, likely secondary to chronic obstructive pulmonary disease. 2. No wedge-shaped perfusion defects to strongly suggest acute pulmonary embolism. Perfusion only imaging is limited in this context. If strong clinical suspicion of thromboembolic disease, consider further evaluation with chest CTA or lower extremity venous Doppler ultrasound. Electronically Signed   By: Elsie Perone M.D.   On: 10/12/2024 16:11   DG Chest 1 View Result Date: 10/10/2024 CLINICAL DATA:  Possible aspiration. EXAM: CHEST  1 VIEW COMPARISON:  Chest CT 03/08/2020 FINDINGS: The heart size and mediastinal contours are within normal limits. There is likely linear external artifact overlying the right upper chest. There is no focal lung infiltrate, pleural effusion or pneumothorax. The visualized skeletal structures are unremarkable. IMPRESSION: No active disease. Electronically Signed   By: Greig Pique M.D.   On: 10/10/2024 22:16    Microbiology: Results for orders placed or performed during the hospital encounter of 10/10/24  Resp panel by RT-PCR (RSV, Flu A&B, Covid) Anterior Nasal Swab     Status: None   Collection Time: 10/17/24  5:05 PM   Specimen: Anterior Nasal Swab  Result Value Ref Range Status   SARS Coronavirus 2 by RT PCR NEGATIVE NEGATIVE Final    Comment: (NOTE) SARS-CoV-2 target nucleic acids are NOT DETECTED.  The SARS-CoV-2 RNA is generally detectable in upper respiratory specimens during the acute phase of infection. The lowest concentration of SARS-CoV-2 viral copies this assay can detect is 138 copies/mL. A negative result does not preclude SARS-Cov-2 infection and should not be used as the sole basis for treatment or other patient management decisions. A negative result may occur with  improper specimen collection/handling, submission of specimen  other than nasopharyngeal swab, presence of viral mutation(s) within the areas targeted by this assay, and inadequate number of viral copies(<138 copies/mL). A negative result must be combined with clinical observations, patient history, and epidemiological information. The expected result is Negative.  Fact Sheet for Patients:  bloggercourse.com  Fact Sheet for Healthcare Providers:  seriousbroker.it  This test is no t yet approved or cleared by the [place]  FDA and  has been authorized for detection and/or diagnosis of SARS-CoV-2 by FDA under an Emergency Use Authorization (EUA). This EUA will remain  in effect (meaning this test can be used) for the duration of the COVID-19 declaration under Section 564(b)(1) of the Act, 21 U.S.C.section 360bbb-3(b)(1), unless the authorization is terminated  or revoked sooner.       Influenza A by PCR NEGATIVE NEGATIVE Final   Influenza B by PCR NEGATIVE NEGATIVE Final    Comment: (NOTE) The Xpert Xpress SARS-CoV-2/FLU/RSV plus assay is intended as an aid in the diagnosis of influenza from Nasopharyngeal swab specimens and should not be used as a sole  basis for treatment. Nasal washings and aspirates are unacceptable for Xpert Xpress SARS-CoV-2/FLU/RSV testing.  Fact Sheet for Patients: bloggercourse.com  Fact Sheet for Healthcare Providers: seriousbroker.it  This test is not yet approved or cleared by the [place]  FDA and has been authorized for detection and/or diagnosis of SARS-CoV-2 by FDA under an Emergency Use Authorization (EUA). This EUA will remain in effect (meaning this test can be used) for the duration of the COVID-19 declaration under Section 564(b)(1) of the Act, 21 U.S.C. section 360bbb-3(b)(1), unless the authorization is terminated or revoked.     Resp Syncytial Virus by PCR NEGATIVE NEGATIVE Final     Comment: (NOTE) Fact Sheet for Patients: bloggercourse.com  Fact Sheet for Healthcare Providers: seriousbroker.it  This test is not yet approved or cleared by the [place]  FDA and has been authorized for detection and/or diagnosis of SARS-CoV-2 by FDA under an Emergency Use Authorization (EUA). This EUA will remain in effect (meaning this test can be used) for the duration of the COVID-19 declaration under Section 564(b)(1) of the Act, 21 U.S.C. section 360bbb-3(b)(1), unless the authorization is terminated or revoked.  Performed at [facility], [address] [place], [place] [address]   Culture, blood (Routine X 2) w Reflex to ID Panel     Status: None   Collection Time: 10/17/24  5:06 PM   Specimen: BLOOD  Result Value Ref Range Status   Specimen Description BLOOD BLOOD LEFT HAND  Final   Special Requests   Final    BOTTLES DRAWN AEROBIC AND ANAEROBIC Blood Culture adequate volume   Culture   Final    NO GROWTH 5 DAYS Performed at [facility], [address] [place], [place] [address]    Report Status 10/22/2024 FINAL  Final  Culture, blood (Routine X 2) w Reflex to ID Panel     Status: None   Collection Time: 10/17/24  5:06 PM   Specimen: BLOOD  Result Value Ref Range Status   Specimen Description BLOOD BLOOD RIGHT HAND  Final   Special Requests   Final    BOTTLES DRAWN AEROBIC AND ANAEROBIC Blood Culture adequate volume   Culture   Final    NO GROWTH 5 DAYS Performed at [facility], [address] [place], [place] [address]    Report Status 10/22/2024 FINAL  Final  Respiratory (~20 pathogens) panel by PCR     Status: None   Collection Time: 10/17/24 11:01 PM   Specimen: Nasopharyngeal Swab; Respiratory  Result Value Ref Range Status   Adenovirus NOT DETECTED NOT DETECTED Final   Coronavirus 229E NOT DETECTED NOT DETECTED Final    Comment: (NOTE) The Coronavirus on the  Respiratory Panel, DOES NOT test for the novel  Coronavirus (2019 nCoV)    Coronavirus HKU1 NOT DETECTED NOT DETECTED Final   Coronavirus NL63 NOT DETECTED NOT DETECTED Final   Coronavirus OC43 NOT DETECTED NOT DETECTED Final   Metapneumovirus NOT DETECTED NOT DETECTED Final   Rhinovirus / Enterovirus NOT DETECTED NOT DETECTED Final   Influenza A NOT DETECTED NOT DETECTED Final   Influenza B NOT DETECTED NOT DETECTED Final   Parainfluenza Virus 1 NOT DETECTED NOT DETECTED Final   Parainfluenza Virus 2 NOT DETECTED NOT DETECTED Final   Parainfluenza Virus 3 NOT DETECTED NOT DETECTED Final   Parainfluenza Virus 4 NOT DETECTED NOT DETECTED Final   Respiratory Syncytial Virus NOT DETECTED NOT DETECTED Final   Bordetella pertussis NOT DETECTED NOT DETECTED Final   Bordetella  Parapertussis NOT DETECTED NOT DETECTED Final   Chlamydophila pneumoniae NOT DETECTED NOT DETECTED Final   Mycoplasma pneumoniae NOT DETECTED NOT DETECTED Final    Comment: Performed at [facility], [address] [place], [place] [address]    Labs: CBC: Recent Labs  Lab 10/17/24 2134 10/18/24 0945 10/19/24 0515 10/20/24 0400 10/22/24 0330  WBC  --  11.6* 10.5  --   --   HGB 6.5* 7.9* 7.8* 8.0* 8.2*  HCT  --  26.2* 25.6*  --   --   MCV  --  84.2 82.3  --   --   PLT  --  284 350  --   --    Basic Metabolic Panel: Recent Labs  Lab 10/18/24 0945 10/19/24 0514 10/19/24 0515 10/20/24 0400 10/21/24 0608 10/22/24 0330 10/22/24 1008  NA 144  --  144 145 145 145  --   K 3.4*  --  4.1 3.8 3.8 3.5  --   CL 111  --  113* 111 111 109  --   CO2 24  --  22 25 27 28   --   GLUCOSE 141*  --  104* 92 107* 110*  --   BUN 46*  --  40* 32* 27* 23  --   CREATININE 2.24*  --  2.02* 1.75* 1.51* 1.46*  --   CALCIUM  7.9*  --  7.9* 7.5* 7.8* 7.7*  --   MG 1.5* 2.1  --   --   --   --  1.4*   Liver Function Tests: Recent Labs  Lab 10/19/24 0515  AST 29  ALT 31  ALKPHOS 72  BILITOT 0.3  PROT 5.1*  ALBUMIN   2.6*   CBG: No results for input(s): GLUCAP in the last 168 hours.  Discharge time spent: greater than 30 minutes.  Signed: [name], MD [facility] 10/22/2024

## 2024-10-22 NOTE — Plan of Care (Signed)

## 2024-10-22 NOTE — Plan of Care (Signed)

## 2024-10-22 NOTE — Progress Notes (Signed)
Report called to [facility] 

## 2024-11-06 ENCOUNTER — Inpatient Hospital Stay: Attending: Oncology

## 2024-11-06 ENCOUNTER — Telehealth: Payer: Self-pay | Admitting: Oncology

## 2024-11-06 NOTE — Telephone Encounter (Signed)
 Pt friend called and said pt had been in the hospital and is now at [facility] so they will need to r/s his appts - requested to just cancel appts for now and pt will call back r/s when pt is d/c from facility - [facility]

## 2024-11-07 ENCOUNTER — Other Ambulatory Visit: Payer: Self-pay | Admitting: Nurse Practitioner

## 2024-11-09 NOTE — Telephone Encounter (Signed)
 Requested Prescriptions  Pending Prescriptions Disp Refills   atorvastatin  (LIPITOR) 40 MG tablet [Pharmacy Med Name: Atorvastatin  Calcium  40 MG Oral Tablet] 90 tablet 0    Sig: Take 1 tablet by mouth once daily     Cardiovascular:  Antilipid - Statins Failed - 11/09/2024  3:40 PM      Failed - Lipid Panel in normal range within the last 12 months    Cholesterol, Total  Date Value Ref Range Status  10/12/2023 159 100 - 199 mg/dL Final   LDL Chol Calc (NIH)  Date Value Ref Range Status  10/12/2023 73 0 - 99 mg/dL Final   HDL  Date Value Ref Range Status  10/12/2023 71 >39 mg/dL Final   Triglycerides  Date Value Ref Range Status  10/12/2023 80 0 - 149 mg/dL Final         Passed - Patient is not pregnant      Passed - Valid encounter within last 12 months    Recent Outpatient Visits           1 month ago Bladder outlet obstruction   [facility] [name], NP   2 months ago Other fatigue   [facility] [place], Pao, NP   2 months ago Chronic obstructive pulmonary disease, unspecified COPD type [facility])   [facility] [facility] [name], NP   3 months ago Chronic obstructive pulmonary disease, unspecified COPD type (HCC)   [place] [facility] [place], [place] T, NP   6 months ago Chronic obstructive pulmonary disease, unspecified COPD type [facility])   [place] [facility] [name], NP

## 2024-11-13 ENCOUNTER — Inpatient Hospital Stay: Admitting: Oncology

## 2024-11-13 ENCOUNTER — Inpatient Hospital Stay

## 2024-11-24 ENCOUNTER — Telehealth: Payer: Self-pay

## 2024-11-24 NOTE — Telephone Encounter (Signed)
 Reason for CRM: Nena with [facility] is calling for a Ralph Leon with home health orders. Could not locate with the info given by sandra. Cb [phone]

## 2024-11-27 ENCOUNTER — Telehealth: Payer: Self-pay

## 2024-11-27 ENCOUNTER — Ambulatory Visit: Payer: Self-pay

## 2024-11-27 NOTE — Telephone Encounter (Signed)
 FYI Only or Action Required?: Action required by provider: clinical question for provider and update on patient condition.  Patient was last seen in primary care on 09/21/2024 by [name], NP.  Called Nurse Triage reporting Fall.  Symptoms began yesterday.  Interventions attempted: Nothing.  Symptoms are: stable.  Triage Disposition: Call PCP Within 24 Hours  Patient/caregiver understands and will follow disposition?: Yes   Copied from CRM [id number]. Topic: Clinical - Red Word Triage >> Nov 27, 2024 11:54 AM Myrick T wrote: Red Word that prompted transfer to Nurse Triage: [name] the [facility] nurse called stated patient had a fall on yesterday and had to call for someone to help him up. Patient reported that he felt fine but his heart rate today was 50.    Reason for Disposition  [1] Follow-up call from patient regarding patient's clinical status AND [2] information NON-URGENT  Answer Assessment - Initial Assessment Questions 1. REASON FOR CALL or QUESTION: What is your reason for calling today? or How can I best     Stephanie with [facility], contacted clinic to report pt fell yesterday. Pt reported to her during their visit that he stood up in his kitchen and his legs went out from under him. Pt unable to confirm if he hit his head; [name] denies any visible injury, no wounds, bruises. Pt was unable to get up without assistance. He denied any symptoms today. Report of HR 50.  [name] also requesting verbal orders to see pt, once a week x 8 weeks for physical therapy. She can be contacted at [phone]  Protocols used: PCP Call - No Triage-A-AH

## 2024-11-27 NOTE — Telephone Encounter (Signed)
 Okay for verbal orders.

## 2024-11-27 NOTE — Telephone Encounter (Signed)
 Copied from CRM [id number]. Topic: Clinical - Home Health Verbal Orders >> Nov 27, 2024 11:47 AM Myrick T wrote: Caller/Agency: [name] Dade with [facility] Callback Number: [phone] Service Requested: Physical Therapy Frequency: 1x8w Any new concerns about the patient? Yes Nurse said patient fell on yesterday and had to get someone to come help him out the floor but he feels fine. Patient has a history of falls

## 2024-11-27 NOTE — Telephone Encounter (Signed)
 I did follow back up with Ralph Leon with [facility], phone number [phone], and I did let her know the provider gave her ok for the verbal orders.

## 2024-11-29 ENCOUNTER — Emergency Department

## 2024-11-29 ENCOUNTER — Encounter: Payer: Self-pay | Admitting: Nurse Practitioner

## 2024-11-29 ENCOUNTER — Encounter: Payer: Self-pay | Admitting: Intensive Care

## 2024-11-29 ENCOUNTER — Ambulatory Visit (INDEPENDENT_AMBULATORY_CARE_PROVIDER_SITE_OTHER): Admitting: Nurse Practitioner

## 2024-11-29 ENCOUNTER — Inpatient Hospital Stay
Admission: EM | Admit: 2024-11-29 | Discharge: 2024-12-02 | DRG: 394 | Disposition: A | Discharge status: Home Health | Source: Ambulatory Visit | Attending: Internal Medicine | Admitting: Internal Medicine

## 2024-11-29 ENCOUNTER — Other Ambulatory Visit: Payer: Self-pay

## 2024-11-29 VITALS — BP 84/45 | HR 135 | Temp 98.0°F | Ht 64.02 in | Wt 123.8 lb

## 2024-11-29 DIAGNOSIS — Z8673 Personal history of transient ischemic attack (TIA), and cerebral infarction without residual deficits: Secondary | ICD-10-CM

## 2024-11-29 DIAGNOSIS — Z833 Family history of diabetes mellitus: Secondary | ICD-10-CM

## 2024-11-29 DIAGNOSIS — Z79899 Other long term (current) drug therapy: Secondary | ICD-10-CM | POA: Diagnosis not present

## 2024-11-29 DIAGNOSIS — Z1152 Encounter for screening for COVID-19: Secondary | ICD-10-CM

## 2024-11-29 DIAGNOSIS — R000 Tachycardia, unspecified: Secondary | ICD-10-CM

## 2024-11-29 DIAGNOSIS — N1832 Chronic kidney disease, stage 3b: Secondary | ICD-10-CM | POA: Diagnosis present

## 2024-11-29 DIAGNOSIS — D509 Iron deficiency anemia, unspecified: Secondary | ICD-10-CM | POA: Diagnosis present

## 2024-11-29 DIAGNOSIS — Z66 Do not resuscitate: Secondary | ICD-10-CM | POA: Diagnosis present

## 2024-11-29 DIAGNOSIS — D62 Acute posthemorrhagic anemia: Secondary | ICD-10-CM | POA: Diagnosis present

## 2024-11-29 DIAGNOSIS — I959 Hypotension, unspecified: Secondary | ICD-10-CM | POA: Diagnosis present

## 2024-11-29 DIAGNOSIS — F0390 Unspecified dementia without behavioral disturbance: Secondary | ICD-10-CM | POA: Diagnosis present

## 2024-11-29 DIAGNOSIS — K2091 Esophagitis, unspecified with bleeding: Secondary | ICD-10-CM | POA: Diagnosis present

## 2024-11-29 DIAGNOSIS — F17210 Nicotine dependence, cigarettes, uncomplicated: Secondary | ICD-10-CM | POA: Diagnosis present

## 2024-11-29 DIAGNOSIS — Q438 Other specified congenital malformations of intestine: Secondary | ICD-10-CM

## 2024-11-29 DIAGNOSIS — K254 Chronic or unspecified gastric ulcer with hemorrhage: Secondary | ICD-10-CM | POA: Diagnosis present

## 2024-11-29 DIAGNOSIS — N1831 Chronic kidney disease, stage 3a: Secondary | ICD-10-CM | POA: Diagnosis not present

## 2024-11-29 DIAGNOSIS — K633 Ulcer of intestine: Principal | ICD-10-CM | POA: Diagnosis present

## 2024-11-29 DIAGNOSIS — D122 Benign neoplasm of ascending colon: Secondary | ICD-10-CM | POA: Diagnosis present

## 2024-11-29 DIAGNOSIS — I129 Hypertensive chronic kidney disease with stage 1 through stage 4 chronic kidney disease, or unspecified chronic kidney disease: Secondary | ICD-10-CM | POA: Diagnosis present

## 2024-11-29 DIAGNOSIS — R0602 Shortness of breath: Principal | ICD-10-CM

## 2024-11-29 DIAGNOSIS — K922 Gastrointestinal hemorrhage, unspecified: Secondary | ICD-10-CM | POA: Diagnosis present

## 2024-11-29 DIAGNOSIS — J449 Chronic obstructive pulmonary disease, unspecified: Secondary | ICD-10-CM | POA: Diagnosis present

## 2024-11-29 DIAGNOSIS — J9621 Acute and chronic respiratory failure with hypoxia: Secondary | ICD-10-CM | POA: Diagnosis present

## 2024-11-29 DIAGNOSIS — Z8249 Family history of ischemic heart disease and other diseases of the circulatory system: Secondary | ICD-10-CM | POA: Diagnosis not present

## 2024-11-29 DIAGNOSIS — D649 Anemia, unspecified: Secondary | ICD-10-CM

## 2024-11-29 DIAGNOSIS — D500 Iron deficiency anemia secondary to blood loss (chronic): Secondary | ICD-10-CM | POA: Diagnosis not present

## 2024-11-29 LAB — CBC
HCT: 20.4 % — ABNORMAL LOW (ref 39.0–52.0)
Hemoglobin: 5.9 g/dL — ABNORMAL LOW (ref 13.0–17.0)
MCH: 27.2 pg (ref 26.0–34.0)
MCHC: 28.9 g/dL — ABNORMAL LOW (ref 30.0–36.0)
MCV: 94 fL (ref 80.0–100.0)
Platelets: 218 K/uL (ref 150–400)
RBC: 2.17 MIL/uL — ABNORMAL LOW (ref 4.22–5.81)
RDW: 23.3 % — ABNORMAL HIGH (ref 11.5–15.5)
WBC: 6.7 K/uL (ref 4.0–10.5)
nRBC: 0 % (ref 0.0–0.2)

## 2024-11-29 LAB — BASIC METABOLIC PANEL WITH GFR
Anion gap: 14 (ref 5–15)
BUN: 30 mg/dL — ABNORMAL HIGH (ref 8–23)
CO2: 23 mmol/L (ref 22–32)
Calcium: 9 mg/dL (ref 8.9–10.3)
Chloride: 107 mmol/L (ref 98–111)
Creatinine, Ser: 1.26 mg/dL — ABNORMAL HIGH (ref 0.61–1.24)
GFR, Estimated: 56 mL/min — ABNORMAL LOW
Glucose, Bld: 107 mg/dL — ABNORMAL HIGH (ref 70–99)
Potassium: 4.4 mmol/L (ref 3.5–5.1)
Sodium: 143 mmol/L (ref 135–145)

## 2024-11-29 LAB — PREPARE RBC (CROSSMATCH)

## 2024-11-29 MED ORDER — PANTOPRAZOLE SODIUM 40 MG IV SOLR: 40.0000 mg | Freq: Two times a day (BID) | INTRAVENOUS | Status: DC

## 2024-11-29 MED ORDER — PANTOPRAZOLE SODIUM 40 MG IV SOLR
40.0000 mg | INTRAVENOUS | Status: AC
  Administered 2024-11-29 (×2): 40 mg via INTRAVENOUS
  Filled 2024-11-29 (×2): qty 10

## 2024-11-29 MED ORDER — DONEPEZIL HCL 5 MG PO TABS
5.0000 mg | ORAL_TABLET | Freq: Every day | ORAL | Status: DC
  Administered 2024-11-29 – 2024-12-01 (×3): 5 mg via ORAL
  Filled 2024-11-29 (×3): qty 1

## 2024-11-29 MED ORDER — SODIUM CHLORIDE 0.9 % IV SOLN
10.0000 mL/h | Freq: Once | INTRAVENOUS | Status: AC
  Administered 2024-11-29: 10 mL/h via INTRAVENOUS

## 2024-11-29 MED ORDER — ACETAMINOPHEN 325 MG PO TABS: 650.0000 mg | ORAL_TABLET | Freq: Four times a day (QID) | ORAL | Status: DC | PRN

## 2024-11-29 MED ORDER — BISACODYL 5 MG PO TBEC: 5.0000 mg | DELAYED_RELEASE_TABLET | Freq: Every day | ORAL | Status: DC | PRN

## 2024-11-29 MED ORDER — PANTOPRAZOLE SODIUM 40 MG PO TBEC: 40.0000 mg | DELAYED_RELEASE_TABLET | Freq: Every day | ORAL | Status: DC

## 2024-11-29 MED ORDER — ACETAMINOPHEN 650 MG RE SUPP: 650.0000 mg | Freq: Four times a day (QID) | RECTAL | Status: DC | PRN

## 2024-11-29 MED ORDER — TRAZODONE HCL 50 MG PO TABS: 25.0000 mg | ORAL_TABLET | Freq: Every evening | ORAL | Status: DC | PRN

## 2024-11-29 MED ORDER — ONDANSETRON HCL 4 MG PO TABS: 4.0000 mg | ORAL_TABLET | Freq: Four times a day (QID) | ORAL | Status: DC | PRN

## 2024-11-29 MED ORDER — IRON SUCROSE 500 MG IVPB - SIMPLE MED: 500.0000 mg | Freq: Once | INTRAVENOUS | Status: DC

## 2024-11-29 MED ORDER — PANTOPRAZOLE SODIUM 40 MG PO TBEC
40.0000 mg | DELAYED_RELEASE_TABLET | Freq: Every day | ORAL | Status: DC
  Administered 2024-11-30 – 2024-12-02 (×3): 40 mg via ORAL
  Filled 2024-11-29 (×3): qty 1

## 2024-11-29 MED ORDER — ASPIRIN 81 MG PO TBEC
81.0000 mg | DELAYED_RELEASE_TABLET | Freq: Every day | ORAL | Status: DC
  Administered 2024-11-29 – 2024-12-02 (×4): 81 mg via ORAL
  Filled 2024-11-29 (×4): qty 1

## 2024-11-29 MED ORDER — ALBUTEROL SULFATE (2.5 MG/3ML) 0.083% IN NEBU: 2.5000 mg | INHALATION_SOLUTION | RESPIRATORY_TRACT | Status: DC | PRN

## 2024-11-29 MED ORDER — ONDANSETRON HCL 4 MG/2ML IJ SOLN: 4.0000 mg | Freq: Four times a day (QID) | INTRAMUSCULAR | Status: DC | PRN

## 2024-11-29 MED ORDER — SENNOSIDES-DOCUSATE SODIUM 8.6-50 MG PO TABS
1.0000 | ORAL_TABLET | Freq: Every day | ORAL | Status: DC
  Administered 2024-11-29 – 2024-12-01 (×3): 1 via ORAL
  Filled 2024-11-29 (×4): qty 1

## 2024-11-29 MED ORDER — MAGNESIUM OXIDE -MG SUPPLEMENT 400 (240 MG) MG PO TABS
400.0000 mg | ORAL_TABLET | Freq: Every day | ORAL | Status: DC
  Administered 2024-11-29 – 2024-12-02 (×4): 400 mg via ORAL
  Filled 2024-11-29 (×4): qty 1

## 2024-11-29 MED ORDER — ATORVASTATIN CALCIUM 20 MG PO TABS
40.0000 mg | ORAL_TABLET | Freq: Every day | ORAL | Status: DC
  Administered 2024-11-29 – 2024-12-02 (×4): 40 mg via ORAL
  Filled 2024-11-29 (×4): qty 2

## 2024-11-29 MED ORDER — PROCHLORPERAZINE MALEATE 5 MG PO TABS: 5.0000 mg | ORAL_TABLET | Freq: Four times a day (QID) | ORAL | Status: DC | PRN

## 2024-11-29 NOTE — H&P (Signed)
 " History and Physical    Ralph Leon FMW:[id number] DOB: February 12, 1936 DOA: 11/29/2024  PCP: [name], NP (Confirm with patient/family/NH records and if not entered, this has to be entered at [facility] point of entry) Patient coming from: Home  I have personally briefly reviewed patient's old medical records in [facility]  Chief Complaint: Feeling tired, SOB  HPI: Ralph Leon is a 89 y.o. male with medical history significant of iron  deficiency anemia secondary to GI bleed, COPD, HTN, CKD stage IIIb, stroke, sent by PCP for evaluation of hypoxia tachycardia.  Patient complaining about feeling fatigued, and worsening of exertional dyspnea.  Patient was hospitalized in November patient here for hematemesis.  When patient was transfused with PRBC x 1 and underwent EGD.  EGD showed esophagitis and nonbleeding gastric erosion without any active bleeding signs.  Patient however declined colonoscopy offer on last admission.  After discharge home, patient reported that he has no more episodes of hematemesis but he does have rectal bleeding, when he wipes, 1-2 times a week.  He however never take a notice of his stool color or any blood in the stool.  Denied any abdominal pain no weight loss.  Increasingly has been feeling more fatigued and exercise tolerance has decreased.  Today, he went to see PCP today, who found patient to be tachycardia and hypoxic with O2 saturation less than 85% and sent patient to ED.  Patient has been getting iron  infusion every week at cancer center since November and so far he received 4 treatments.   ED Course: Afebrile, blood pressure 120/50 O2 saturation 100% on room air.  Blood work showed hemoglobin 5.9 compared to 8.2 on discharge in November this year, BUN 30 creatinine 1.2 compared to baseline 1.4-1.7.  PRBC x 1 ordered.  Review of Systems: As per HPI otherwise 14 point review of systems negative.    Past Medical History:  Diagnosis Date   COPD (chronic  obstructive pulmonary disease) (HCC)    Hypertension    Stroke [facility])     Past Surgical History:  Procedure Laterality Date   ESOPHAGOGASTRODUODENOSCOPY N/A 10/13/2024   Procedure: EGD (ESOPHAGOGASTRODUODENOSCOPY);  Surgeon: Jinny Carmine, MD;  Location: [facility] ENDOSCOPY;  Service: Endoscopy;  Laterality: N/A;     reports that he has quit smoking. His smoking use included cigarettes. He has a 16 pack-year smoking history. He has never used smokeless tobacco. He reports that he does not currently use alcohol. He reports that he does not currently use drugs.  Allergies[1]  Family History  Problem Relation Age of Onset   Diabetes Mother    Heart attack Father      Prior to Admission medications  Medication Sig Start Date End Date Taking? Authorizing Provider  albuterol  (VENTOLIN  HFA) 108 (90 Base) MCG/ACT inhaler INHALE 2 PUFFS BY MOUTH EVERY 6 HOURS AS NEEDED FOR WHEEZING FOR SHORTNESS OF BREATH 09/05/24  Yes [name], NP  aspirin  EC 81 MG tablet Take 1 tablet (81 mg total) by mouth daily. Swallow whole. 06/02/21  Yes Darliss Rogue, MD  atorvastatin  (LIPITOR) 40 MG tablet Take 1 tablet by mouth once daily 11/09/24  Yes [name], NP  donepezil  (ARICEPT ) 5 MG tablet Take 5 mg by mouth at bedtime. 08/14/24  Yes [provider]  magnesium  oxide (MAG-OX) 400 (240 Mg) MG tablet Take 400 mg by mouth daily.   Yes [provider]  pantoprazole  (PROTONIX ) 40 MG tablet Take 1 tablet (40 mg total) by mouth daily. 10/23/24  Yes Ponnala,  Shruthi, MD  prochlorperazine  (COMPAZINE ) 5 MG tablet Take 1 tablet (5 mg total) by mouth every 6 (six) hours as needed for nausea or vomiting. 10/22/24  Yes Ponnala, Shruthi, MD  senna-docusate (SENOKOT-S) 8.6-50 MG tablet Take 1 tablet by mouth daily.   Yes [provider]  vitamin D3 (CHOLECALCIFEROL) 25 MCG tablet Take 1,000 Units by mouth daily.   Yes [provider]  [Paused] hydrochlorothiazide  (HYDRODIURIL )  12.5 MG tablet Take 12.5 mg by mouth daily. Wait to take this until your doctor or other care provider tells you to start again.    [provider]  [Paused] lisinopril  (ZESTRIL ) 20 MG tablet Take 1 tablet by mouth once daily Wait to take this until your doctor or other care provider tells you to start again. 07/17/24   [name], NP    Physical Exam: Vitals:   11/29/24 1500 11/29/24 1503 11/29/24 1517 11/29/24 1711  BP:  (!) 108/43  (!) 127/57  Pulse:  88  78  Resp:  20  20  Temp:  98.3 F (36.8 C)  98.1 F (36.7 C)  TempSrc:  Oral  Oral  SpO2:  100% 100% 100%  Weight: 56.2 kg     Height: 5' 4 (1.626 m)       Constitutional: NAD, calm, comfortable Vitals:   11/29/24 1500 11/29/24 1503 11/29/24 1517 11/29/24 1711  BP:  (!) 108/43  (!) 127/57  Pulse:  88  78  Resp:  20  20  Temp:  98.3 F (36.8 C)  98.1 F (36.7 C)  TempSrc:  Oral  Oral  SpO2:  100% 100% 100%  Weight: 56.2 kg     Height: 5' 4 (1.626 m)      Eyes: PERRL, lids and conjunctivae normal ENMT: Mucous membranes are moist. Posterior pharynx clear of any exudate or lesions.Normal dentition.  Neck: normal, supple, no masses, no thyromegaly Respiratory: clear to auscultation bilaterally, no wheezing, no crackles. Normal respiratory effort. No accessory muscle use.  Cardiovascular: Regular rate and rhythm, no murmurs / rubs / gallops. No extremity edema. 2+ pedal pulses. No carotid bruits.  Abdomen: no tenderness, no masses palpated. No hepatosplenomegaly. Bowel sounds positive.  Musculoskeletal: no clubbing / cyanosis. No joint deformity upper and lower extremities. Good ROM, no contractures. Normal muscle tone.  Skin: no rashes, lesions, ulcers. No induration Neurologic: CN 2-12 grossly intact. Sensation intact, DTR normal. Strength 5/5 in all 4.  Psychiatric: Normal judgment and insight. Alert and oriented x 3. Normal mood.    Labs on Admission: I have personally reviewed following labs and  imaging studies  CBC: Recent Labs  Lab 11/29/24 1521  WBC 6.7  HGB 5.9*  HCT 20.4*  MCV 94.0  PLT 218   Basic Metabolic Panel: Recent Labs  Lab 11/29/24 1521  NA 143  K 4.4  CL 107  CO2 23  GLUCOSE 107*  BUN 30*  CREATININE 1.26*  CALCIUM  9.0   GFR: Estimated Creatinine Clearance: 33.5 mL/min (A) (by C-G formula based on SCr of 1.26 mg/dL (H)). Liver Function Tests: No results for input(s): AST, ALT, ALKPHOS, BILITOT, PROT, ALBUMIN  in the last 168 hours. No results for input(s): LIPASE, AMYLASE in the last 168 hours. No results for input(s): AMMONIA in the last 168 hours. Coagulation Profile: No results for input(s): INR, PROTIME in the last 168 hours. Cardiac Enzymes: No results for input(s): CKTOTAL, CKMB, CKMBINDEX, TROPONINI in the last 168 hours. BNP (last 3 results) No results for input(s): PROBNP in the  last 8760 hours. HbA1C: No results for input(s): HGBA1C in the last 72 hours. CBG: No results for input(s): GLUCAP in the last 168 hours. Lipid Profile: No results for input(s): CHOL, HDL, LDLCALC, TRIG, CHOLHDL, LDLDIRECT in the last 72 hours. Thyroid Function Tests: No results for input(s): TSH, T4TOTAL, FREET4, T3FREE, THYROIDAB in the last 72 hours. Anemia Panel: No results for input(s): VITAMINB12, FOLATE, FERRITIN, TIBC, IRON , RETICCTPCT in the last 72 hours. Urine analysis:    Component Value Date/Time   APPEARANCEUR Clear 08/14/2024 1354   GLUCOSEU Negative 08/14/2024 1354   BILIRUBINUR Negative 08/14/2024 1354   PROTEINUR 1+ (A) 08/14/2024 1354   NITRITE Negative 08/14/2024 1354   LEUKOCYTESUR Trace (A) 08/14/2024 1354    Radiological Exams on Admission: DG Chest 2 View Result Date: 11/29/2024 EXAM: 2 VIEW(S) XRAY OF THE CHEST 11/29/2024 03:38:00 PM COMPARISON: 10/17/2024 CLINICAL HISTORY: sob FINDINGS: LUNGS AND PLEURA: Emphysema. Fullness of the right hilar region  measuring 2.5 cm. No pleural effusion. No pneumothorax. HEART AND MEDIASTINUM: Tortuous aorta with aortic atherosclerosis. BONES AND SOFT TISSUES: Diffuse osteopenia. Multilevel degenerative disc disease of the spine. IMPRESSION: 1. Emphysema. No pneumonia or pulmonary edema. 2. Fullness of the right hilar region measuring 2.5 cm.while this may be artifactual due to patient rotation, lymphadenopathy or hilar mass could also have this appearance. Nonemergent chest CT with iv contrast recommended for further characterization. Electronically signed by: Rogelia Myers MD MD 11/29/2024 03:42 PM EST RP Workstation: [id number]    EKG: Independently reviewed.  Sinus rhythm, no acute ST changes.  Assessment/Plan Principal Problem:   Lower GI bleed Active Problems:   GI bleeding  (please populate well all problems here in Problem List. (For example, if patient is on BP meds at home and you resume or decide to hold them, it is a problem that needs to be her. Same for CAD, COPD, HLD and so on)  Acute on chronic symptomatic anemia, iron  deficiency Chronic hematochezia - Suspect lower GI bleed.  Discussed with on-call GI Dr. Aundria, who recommended transfusion stabilized blood pressure. - Given that on recent similar admission for symptomatic anemia EGD was done with no acute bleeding was found, clinically suspect lower GI bleed source.  Continue p.o. PPI - PRBC x 1, repeat H&H afterwards - Liquid diet for tnow  CKD stage IIIb - Creatinine level stable, euvolemic  Acute on chronic respiratory failure with hypoxia, resolved - Likely secondary to symptomatic anemia - Transfusion, GI workup as above.  COPD - Stable, continue as needed breathing meds  Dementia - Continue Aricept   HTN - BP meds were on hold on admission due to prolonged hypotension.  Continue to hold off home BP meds.  Hx of stroke - Continue aspirin  and statin  DVT prophylaxis: SCD Code Status: DNR/DNI Family Communication:  None at bedside Disposition Plan: Patient has decompensated iron  deficiency anemia requiring transfusion and inpatient GI workup, expect more than 2 midnight hospital stay. Consults called: GI Dr. Paulina Admission status: Telemetry admission   Cort ONEIDA Mana MD [facility] Pager [phone]  11/29/2024, 5:19 PM       [1] No Known Allergies  "

## 2024-11-29 NOTE — ED Triage Notes (Signed)
 Patient was at routine checkup at [facility] and sent to ER for low O2 sats of 80%. Reports intermittent SOB  During triage, patients O2 reading is 100%.   Denies pain.

## 2024-11-29 NOTE — Progress Notes (Signed)
 "  BP (!) 84/45   Pulse (!) 135   Temp 98 F (36.7 C) (Oral)   Ht 5' 4.02 (1.626 m)   Wt 123 lb 12.8 oz (56.2 kg)   SpO2 (!) 78%   BMI 21.24 kg/m    Subjective:    Patient ID: Ralph Leon, male    DOB: [birth year], 89 y.o.   MRN: [id number]  HPI: Ralph Leon is a 89 y.o. male  Chief Complaint  Patient presents with   office visit    70month f/u   Patient states he isn't feeling great today.  He missed his appointment with urology.  States he still has trouble urinating.  Feels like it is painful with urination.  Patient was discharged from rehab about 2 weeks ago.  Fatigue and SOB has been worse, feels short of breath, and overall does not feel well today.        Relevant past medical, surgical, family and social history reviewed and updated as indicated. Interim medical history since our last visit reviewed. Allergies and medications reviewed and updated.  Review of Systems  Constitutional:  Positive for fatigue.  Respiratory:  Positive for shortness of breath.   Genitourinary:  Positive for dysuria.    Per HPI unless specifically indicated above     Objective:    BP (!) 84/45   Pulse (!) 135   Temp 98 F (36.7 C) (Oral)   Ht 5' 4.02 (1.626 m)   Wt 123 lb 12.8 oz (56.2 kg)   SpO2 (!) 78%   BMI 21.24 kg/m   Wt Readings from Last 3 Encounters:  11/29/24 123 lb 12.8 oz (56.2 kg)  10/13/24 139 lb 15.9 oz (63.5 kg)  09/21/24 129 lb 9.6 oz (58.8 kg)    Physical Exam Vitals and nursing note reviewed.  Constitutional:      General: He is not in acute distress.    Appearance: Normal appearance. He is ill-appearing. He is not toxic-appearing or diaphoretic.  HENT:     Head: Normocephalic.     Right Ear: External ear normal.     Left Ear: External ear normal.     Nose: Nose normal. No congestion or rhinorrhea.     Mouth/Throat:     Mouth: Mucous membranes are moist.  Eyes:     General:        Right eye: No discharge.        Left eye: No discharge.      Extraocular Movements: Extraocular movements intact.     Conjunctiva/sclera: Conjunctivae normal.     Pupils: Pupils are equal, round, and reactive to light.  Cardiovascular:     Rate and Rhythm: Normal rate and regular rhythm.     Heart sounds: No murmur heard. Pulmonary:     Effort: Pulmonary effort is normal. No respiratory distress.     Breath sounds: Normal breath sounds. No wheezing, rhonchi or rales.  Abdominal:     General: Abdomen is flat. Bowel sounds are normal.  Musculoskeletal:     Cervical back: Normal range of motion and neck supple.     Comments: Uses walker.   Skin:    General: Skin is warm and dry.     Capillary Refill: Capillary refill takes less than 2 seconds.  Neurological:     General: No focal deficit present.     Mental Status: He is alert and oriented to person, place, and time.  Psychiatric:        Mood and Affect:  Mood normal.        Behavior: Behavior normal.        Thought Content: Thought content normal.        Judgment: Judgment normal.     Results for orders placed or performed during the hospital encounter of 10/10/24  Comprehensive metabolic panel   Collection Time: 10/10/24  8:37 PM  Result Value Ref Range   Sodium 142 135 - 145 mmol/L   Potassium 5.4 (H) 3.5 - 5.1 mmol/L   Chloride 101 98 - 111 mmol/L   CO2 20 (L) 22 - 32 mmol/L   Glucose, Bld 106 (H) 70 - 99 mg/dL   BUN 43 (H) 8 - 23 mg/dL   Creatinine, Ser 6.42 (H) 0.61 - 1.24 mg/dL   Calcium  9.1 8.9 - 10.3 mg/dL   Total Protein 6.5 6.5 - 8.1 g/dL   Albumin  3.6 3.5 - 5.0 g/dL   AST 300 (H) 15 - 41 U/L   ALT 123 (H) 0 - 44 U/L   Alkaline Phosphatase 103 38 - 126 U/L   Total Bilirubin 1.0 0.0 - 1.2 mg/dL   GFR, Estimated 16 (L) >60 mL/min   Anion gap 22 (H) 5 - 15  CBC   Collection Time: 10/10/24  8:37 PM  Result Value Ref Range   WBC 30.1 (H) 4.0 - 10.5 K/uL   RBC 3.77 (L) 4.22 - 5.81 MIL/uL   Hemoglobin 9.7 (L) 13.0 - 17.0 g/dL   HCT 67.4 (L) 60.9 - 47.9 %   MCV 86.2 80.0  - 100.0 fL   MCH 25.7 (L) 26.0 - 34.0 pg   MCHC 29.8 (L) 30.0 - 36.0 g/dL   RDW 79.9 (H) 88.4 - 84.4 %   Platelets 279 150 - 400 K/uL   nRBC 0.0 0.0 - 0.2 %  Type and screen [facility]   Collection Time: 10/10/24  8:37 PM  Result Value Ref Range   ABO/RH(D) O POS    Antibody Screen NEG    Sample Expiration      10/13/2024,2359 Performed at [facility], [address] [place], [place] [address]   Protime-INR   Collection Time: 10/11/24  3:30 AM  Result Value Ref Range   Prothrombin Time 22.5 (H) 11.4 - 15.2 seconds   INR 1.9 (H) 0.8 - 1.2  Cortisol-am, blood   Collection Time: 10/11/24  3:30 AM  Result Value Ref Range   Cortisol - AM 52.4 (H) 6.7 - 22.6 ug/dL  Basic metabolic panel   Collection Time: 10/11/24  3:30 AM  Result Value Ref Range   Sodium 142 135 - 145 mmol/L   Potassium 5.0 3.5 - 5.1 mmol/L   Chloride 106 98 - 111 mmol/L   CO2 20 (L) 22 - 32 mmol/L   Glucose, Bld 110 (H) 70 - 99 mg/dL   BUN 50 (H) 8 - 23 mg/dL   Creatinine, Ser 6.36 (H) 0.61 - 1.24 mg/dL   Calcium  8.1 (L) 8.9 - 10.3 mg/dL   GFR, Estimated 16 (L) >60 mL/min   Anion gap 16 (H) 5 - 15  CBC   Collection Time: 10/11/24  3:30 AM  Result Value Ref Range   WBC 17.8 (H) 4.0 - 10.5 K/uL   RBC 3.66 (L) 4.22 - 5.81 MIL/uL   Hemoglobin 9.3 (L) 13.0 - 17.0 g/dL   HCT 68.7 (L) 60.9 - 47.9 %   MCV 85.2 80.0 - 100.0 fL   MCH 25.4 (L) 26.0 - 34.0 pg  MCHC 29.8 (L) 30.0 - 36.0 g/dL   RDW 80.0 (H) 88.4 - 84.4 %   Platelets 237 150 - 400 K/uL   nRBC 0.0 0.0 - 0.2 %  Hepatitis panel, acute   Collection Time: 10/11/24  3:30 AM  Result Value Ref Range   Hepatitis B Surface Ag NON REACTIVE NON REACTIVE   HCV Ab NON REACTIVE NON REACTIVE   Hep A IgM NON REACTIVE NON REACTIVE   Hep B C IgM NON REACTIVE NON REACTIVE  Hepatic function panel   Collection Time: 10/11/24  3:30 AM  Result Value Ref Range   Total Protein 5.8 (L) 6.5 - 8.1 g/dL   Albumin  3.2 (L) 3.5 - 5.0 g/dL    AST 497 (H) 15 - 41 U/L   ALT 103 (H) 0 - 44 U/L   Alkaline Phosphatase 107 38 - 126 U/L   Total Bilirubin 0.8 0.0 - 1.2 mg/dL   Bilirubin, Direct 0.4 (H) 0.0 - 0.2 mg/dL   Indirect Bilirubin 0.4 0.3 - 0.9 mg/dL  Hepatic function panel   Collection Time: 10/12/24  4:17 AM  Result Value Ref Range   Total Protein 5.2 (L) 6.5 - 8.1 g/dL   Albumin  2.7 (L) 3.5 - 5.0 g/dL   AST 778 (H) 15 - 41 U/L   ALT 75 (H) 0 - 44 U/L   Alkaline Phosphatase 91 38 - 126 U/L   Total Bilirubin 0.5 0.0 - 1.2 mg/dL   Bilirubin, Direct 0.2 0.0 - 0.2 mg/dL   Indirect Bilirubin 0.2 (L) 0.3 - 0.9 mg/dL  Basic metabolic panel with GFR   Collection Time: 10/12/24  4:17 AM  Result Value Ref Range   Sodium 141 135 - 145 mmol/L   Potassium 4.7 3.5 - 5.1 mmol/L   Chloride 104 98 - 111 mmol/L   CO2 17 (L) 22 - 32 mmol/L   Glucose, Bld 139 (H) 70 - 99 mg/dL   BUN 67 (H) 8 - 23 mg/dL   Creatinine, Ser 5.26 (H) 0.61 - 1.24 mg/dL   Calcium  7.3 (L) 8.9 - 10.3 mg/dL   GFR, Estimated 11 (L) >60 mL/min   Anion gap 19 (H) 5 - 15  CBC   Collection Time: 10/12/24  4:17 AM  Result Value Ref Range   WBC 12.1 (H) 4.0 - 10.5 K/uL   RBC 3.22 (L) 4.22 - 5.81 MIL/uL   Hemoglobin 8.1 (L) 13.0 - 17.0 g/dL   HCT 72.1 (L) 60.9 - 47.9 %   MCV 86.3 80.0 - 100.0 fL   MCH 25.2 (L) 26.0 - 34.0 pg   MCHC 29.1 (L) 30.0 - 36.0 g/dL   RDW 80.1 (H) 88.4 - 84.4 %   Platelets 211 150 - 400 K/uL   nRBC 0.0 0.0 - 0.2 %  Magnesium    Collection Time: 10/12/24  4:17 AM  Result Value Ref Range   Magnesium  2.3 1.7 - 2.4 mg/dL  D-dimer, quantitative   Collection Time: 10/12/24  4:17 AM  Result Value Ref Range   D-Dimer, Quant 15.76 (H) 0.00 - 0.50 ug/mL-FEU  CBC   Collection Time: 10/13/24  5:39 AM  Result Value Ref Range   WBC 10.8 (H) 4.0 - 10.5 K/uL   RBC 3.13 (L) 4.22 - 5.81 MIL/uL   Hemoglobin 7.8 (L) 13.0 - 17.0 g/dL   HCT 73.9 (L) 60.9 - 47.9 %   MCV 83.1 80.0 - 100.0 fL   MCH 24.9 (L) 26.0 - 34.0 pg   MCHC 30.0 30.0 -  36.0  g/dL   RDW 80.2 (H) 88.4 - 84.4 %   Platelets 229 150 - 400 K/uL   nRBC 0.0 0.0 - 0.2 %  Basic metabolic panel with GFR   Collection Time: 10/13/24  7:49 AM  Result Value Ref Range   Sodium 142 135 - 145 mmol/L   Potassium 3.9 3.5 - 5.1 mmol/L   Chloride 110 98 - 111 mmol/L   CO2 20 (L) 22 - 32 mmol/L   Glucose, Bld 95 70 - 99 mg/dL   BUN 73 (H) 8 - 23 mg/dL   Creatinine, Ser 5.24 (H) 0.61 - 1.24 mg/dL   Calcium  7.2 (L) 8.9 - 10.3 mg/dL   GFR, Estimated 11 (L) >60 mL/min   Anion gap 12 5 - 15  Hepatic function panel   Collection Time: 10/13/24  7:49 AM  Result Value Ref Range   Total Protein 5.0 (L) 6.5 - 8.1 g/dL   Albumin  2.7 (L) 3.5 - 5.0 g/dL   AST 863 (H) 15 - 41 U/L   ALT 66 (H) 0 - 44 U/L   Alkaline Phosphatase 84 38 - 126 U/L   Total Bilirubin 0.4 0.0 - 1.2 mg/dL   Bilirubin, Direct 0.2 0.0 - 0.2 mg/dL   Indirect Bilirubin 0.2 (L) 0.3 - 0.9 mg/dL  Magnesium    Collection Time: 10/13/24  7:49 AM  Result Value Ref Range   Magnesium  2.2 1.7 - 2.4 mg/dL  Hepatic function panel   Collection Time: 10/14/24  4:39 AM  Result Value Ref Range   Total Protein 4.9 (L) 6.5 - 8.1 g/dL   Albumin  2.7 (L) 3.5 - 5.0 g/dL   AST 97 (H) 15 - 41 U/L   ALT 61 (H) 0 - 44 U/L   Alkaline Phosphatase 104 38 - 126 U/L   Total Bilirubin 0.4 0.0 - 1.2 mg/dL   Bilirubin, Direct 0.2 0.0 - 0.2 mg/dL   Indirect Bilirubin 0.2 (L) 0.3 - 0.9 mg/dL  Basic metabolic panel with GFR   Collection Time: 10/14/24  4:39 AM  Result Value Ref Range   Sodium 145 135 - 145 mmol/L   Potassium 3.8 3.5 - 5.1 mmol/L   Chloride 110 98 - 111 mmol/L   CO2 21 (L) 22 - 32 mmol/L   Glucose, Bld 112 (H) 70 - 99 mg/dL   BUN 72 (H) 8 - 23 mg/dL   Creatinine, Ser 5.51 (H) 0.61 - 1.24 mg/dL   Calcium  7.5 (L) 8.9 - 10.3 mg/dL   GFR, Estimated 12 (L) >60 mL/min   Anion gap 13 5 - 15  CBC   Collection Time: 10/14/24  4:39 AM  Result Value Ref Range   WBC 6.3 4.0 - 10.5 K/uL   RBC 3.20 (L) 4.22 - 5.81 MIL/uL    Hemoglobin 7.8 (L) 13.0 - 17.0 g/dL   HCT 73.0 (L) 60.9 - 47.9 %   MCV 84.1 80.0 - 100.0 fL   MCH 24.4 (L) 26.0 - 34.0 pg   MCHC 29.0 (L) 30.0 - 36.0 g/dL   RDW 80.5 (H) 88.4 - 84.4 %   Platelets 182 150 - 400 K/uL   nRBC 0.0 0.0 - 0.2 %  Basic metabolic panel with GFR   Collection Time: 10/15/24  5:29 AM  Result Value Ref Range   Sodium 142 135 - 145 mmol/L   Potassium 4.0 3.5 - 5.1 mmol/L   Chloride 109 98 - 111 mmol/L   CO2 23 22 - 32 mmol/L   Glucose,  Bld 130 (H) 70 - 99 mg/dL   BUN 66 (H) 8 - 23 mg/dL   Creatinine, Ser 6.34 (H) 0.61 - 1.24 mg/dL   Calcium  7.9 (L) 8.9 - 10.3 mg/dL   GFR, Estimated 16 (L) >60 mL/min   Anion gap 10 5 - 15  Creatinine, serum   Collection Time: 10/16/24  5:44 AM  Result Value Ref Range   Creatinine, Ser 2.86 (H) 0.61 - 1.24 mg/dL   GFR, Estimated 21 (L) >60 mL/min  Creatinine, serum   Collection Time: 10/17/24  6:24 AM  Result Value Ref Range   Creatinine, Ser 2.39 (H) 0.61 - 1.24 mg/dL   GFR, Estimated 26 (L) >60 mL/min  Resp panel by RT-PCR (RSV, Flu A&B, Covid) Anterior Nasal Swab   Collection Time: 10/17/24  5:05 PM   Specimen: Anterior Nasal Swab  Result Value Ref Range   SARS Coronavirus 2 by RT PCR NEGATIVE NEGATIVE   Influenza A by PCR NEGATIVE NEGATIVE   Influenza B by PCR NEGATIVE NEGATIVE   Resp Syncytial Virus by PCR NEGATIVE NEGATIVE  Culture, blood (Routine X 2) w Reflex to ID Panel   Collection Time: 10/17/24  5:06 PM   Specimen: BLOOD  Result Value Ref Range   Specimen Description BLOOD BLOOD LEFT HAND    Special Requests      BOTTLES DRAWN AEROBIC AND ANAEROBIC Blood Culture adequate volume   Culture      NO GROWTH 5 DAYS Performed at [facility], [address] [place], [place] [address]    Report Status 10/22/2024 FINAL   Culture, blood (Routine X 2) w Reflex to ID Panel   Collection Time: 10/17/24  5:06 PM   Specimen: BLOOD  Result Value Ref Range   Specimen Description BLOOD BLOOD RIGHT HAND     Special Requests      BOTTLES DRAWN AEROBIC AND ANAEROBIC Blood Culture adequate volume   Culture      NO GROWTH 5 DAYS Performed at [facility], [address] [place], [place] [address]    Report Status 10/22/2024 FINAL   Hemoglobin   Collection Time: 10/17/24  9:34 PM  Result Value Ref Range   Hemoglobin 6.5 (L) 13.0 - 17.0 g/dL  Prepare RBC (crossmatch)   Collection Time: 10/17/24 10:19 PM  Result Value Ref Range   Order Confirmation      ORDER PROCESSED BY BLOOD BANK Performed at [facility], [address] [place], [place] [address]   Respiratory (~20 pathogens) panel by PCR   Collection Time: 10/17/24 11:01 PM   Specimen: Nasopharyngeal Swab; Respiratory  Result Value Ref Range   Adenovirus NOT DETECTED NOT DETECTED   Coronavirus 229E NOT DETECTED NOT DETECTED   Coronavirus HKU1 NOT DETECTED NOT DETECTED   Coronavirus NL63 NOT DETECTED NOT DETECTED   Coronavirus OC43 NOT DETECTED NOT DETECTED   Metapneumovirus NOT DETECTED NOT DETECTED   Rhinovirus / Enterovirus NOT DETECTED NOT DETECTED   Influenza A NOT DETECTED NOT DETECTED   Influenza B NOT DETECTED NOT DETECTED   Parainfluenza Virus 1 NOT DETECTED NOT DETECTED   Parainfluenza Virus 2 NOT DETECTED NOT DETECTED   Parainfluenza Virus 3 NOT DETECTED NOT DETECTED   Parainfluenza Virus 4 NOT DETECTED NOT DETECTED   Respiratory Syncytial Virus NOT DETECTED NOT DETECTED   Bordetella pertussis NOT DETECTED NOT DETECTED   Bordetella Parapertussis NOT DETECTED NOT DETECTED   Chlamydophila pneumoniae NOT DETECTED NOT DETECTED   Mycoplasma pneumoniae NOT DETECTED NOT DETECTED  Type and screen [facility]   Collection Time: 10/17/24 11:57 PM  Result Value Ref Range   ABO/RH(D) O POS    Antibody Screen NEG    Sample Expiration 10/20/2024,2359    Unit Number [id number]    Blood Component Type RBC LR PHER1    Unit division 00    Status of Unit ISSUED,FINAL    Transfusion  Status OK TO TRANSFUSE    Crossmatch Result      Compatible Performed at [facility], [address] [place], [place] [address]   BPAM [facility]   Collection Time: 10/17/24 11:57 PM  Result Value Ref Range   ISSUE DATE / TIME [id number]    Blood Product Unit Number [id number]    PRODUCT CODE [id number]    Unit Type and Rh 5100    Blood Product Expiration Date [id number]   Basic metabolic panel   Collection Time: 10/18/24  9:45 AM  Result Value Ref Range   Sodium 144 135 - 145 mmol/L   Potassium 3.4 (L) 3.5 - 5.1 mmol/L   Chloride 111 98 - 111 mmol/L   CO2 24 22 - 32 mmol/L   Glucose, Bld 141 (H) 70 - 99 mg/dL   BUN 46 (H) 8 - 23 mg/dL   Creatinine, Ser 7.75 (H) 0.61 - 1.24 mg/dL   Calcium  7.9 (L) 8.9 - 10.3 mg/dL   GFR, Estimated 28 (L) >60 mL/min   Anion gap 9 5 - 15  CBC   Collection Time: 10/18/24  9:45 AM  Result Value Ref Range   WBC 11.6 (H) 4.0 - 10.5 K/uL   RBC 3.11 (L) 4.22 - 5.81 MIL/uL   Hemoglobin 7.9 (L) 13.0 - 17.0 g/dL   HCT 73.7 (L) 60.9 - 47.9 %   MCV 84.2 80.0 - 100.0 fL   MCH 25.4 (L) 26.0 - 34.0 pg   MCHC 30.2 30.0 - 36.0 g/dL   RDW 81.0 (H) 88.4 - 84.4 %   Platelets 284 150 - 400 K/uL   nRBC 0.0 0.0 - 0.2 %  Procalcitonin   Collection Time: 10/18/24  9:45 AM  Result Value Ref Range   Procalcitonin 39.10 ng/mL  Magnesium    Collection Time: 10/18/24  9:45 AM  Result Value Ref Range   Magnesium  1.5 (L) 1.7 - 2.4 mg/dL  Magnesium    Collection Time: 10/19/24  5:14 AM  Result Value Ref Range   Magnesium  2.1 1.7 - 2.4 mg/dL  CBC   Collection Time: 10/19/24  5:15 AM  Result Value Ref Range   WBC 10.5 4.0 - 10.5 K/uL   RBC 3.11 (L) 4.22 - 5.81 MIL/uL   Hemoglobin 7.8 (L) 13.0 - 17.0 g/dL   HCT 74.3 (L) 60.9 - 47.9 %   MCV 82.3 80.0 - 100.0 fL   MCH 25.1 (L) 26.0 - 34.0 pg   MCHC 30.5 30.0 - 36.0 g/dL   RDW 81.0 (H) 88.4 - 84.4 %   Platelets 350 150 - 400 K/uL   nRBC 0.0 0.0 - 0.2 %  Comprehensive metabolic panel   Collection  Time: 10/19/24  5:15 AM  Result Value Ref Range   Sodium 144 135 - 145 mmol/L   Potassium 4.1 3.5 - 5.1 mmol/L   Chloride 113 (H) 98 - 111 mmol/L   CO2 22 22 - 32 mmol/L   Glucose, Bld 104 (H) 70 - 99 mg/dL   BUN 40 (H) 8 - 23 mg/dL   Creatinine, Ser 7.97 (H) 0.61 -  1.24 mg/dL   Calcium  7.9 (L) 8.9 - 10.3 mg/dL   Total Protein 5.1 (L) 6.5 - 8.1 g/dL   Albumin  2.6 (L) 3.5 - 5.0 g/dL   AST 29 15 - 41 U/L   ALT 31 0 - 44 U/L   Alkaline Phosphatase 72 38 - 126 U/L   Total Bilirubin 0.3 0.0 - 1.2 mg/dL   GFR, Estimated 32 (L) >60 mL/min   Anion gap 10 5 - 15  Basic metabolic panel   Collection Time: 10/20/24  4:00 AM  Result Value Ref Range   Sodium 145 135 - 145 mmol/L   Potassium 3.8 3.5 - 5.1 mmol/L   Chloride 111 98 - 111 mmol/L   CO2 25 22 - 32 mmol/L   Glucose, Bld 92 70 - 99 mg/dL   BUN 32 (H) 8 - 23 mg/dL   Creatinine, Ser 8.24 (H) 0.61 - 1.24 mg/dL   Calcium  7.5 (L) 8.9 - 10.3 mg/dL   GFR, Estimated 37 (L) >60 mL/min   Anion gap 8 5 - 15  Hemoglobin   Collection Time: 10/20/24  4:00 AM  Result Value Ref Range   Hemoglobin 8.0 (L) 13.0 - 17.0 g/dL  Basic metabolic panel   Collection Time: 10/21/24  6:08 AM  Result Value Ref Range   Sodium 145 135 - 145 mmol/L   Potassium 3.8 3.5 - 5.1 mmol/L   Chloride 111 98 - 111 mmol/L   CO2 27 22 - 32 mmol/L   Glucose, Bld 107 (H) 70 - 99 mg/dL   BUN 27 (H) 8 - 23 mg/dL   Creatinine, Ser 8.48 (H) 0.61 - 1.24 mg/dL   Calcium  7.8 (L) 8.9 - 10.3 mg/dL   GFR, Estimated 45 (L) >60 mL/min   Anion gap 7 5 - 15  Basic metabolic panel   Collection Time: 10/22/24  3:30 AM  Result Value Ref Range   Sodium 145 135 - 145 mmol/L   Potassium 3.5 3.5 - 5.1 mmol/L   Chloride 109 98 - 111 mmol/L   CO2 28 22 - 32 mmol/L   Glucose, Bld 110 (H) 70 - 99 mg/dL   BUN 23 8 - 23 mg/dL   Creatinine, Ser 8.53 (H) 0.61 - 1.24 mg/dL   Calcium  7.7 (L) 8.9 - 10.3 mg/dL   GFR, Estimated 47 (L) >60 mL/min   Anion gap 8 5 - 15  Hemoglobin    Collection Time: 10/22/24  3:30 AM  Result Value Ref Range   Hemoglobin 8.2 (L) 13.0 - 17.0 g/dL  Magnesium    Collection Time: 10/22/24 10:08 AM  Result Value Ref Range   Magnesium  1.4 (L) 1.7 - 2.4 mg/dL      Assessment & Plan:   Problem List Items Addressed This Visit   None Visit Diagnoses       Tachycardia    -  Primary   Patient sent to the ER due to Oxygen not getting above 85.  HR is ranging from 78-145 while in the office. Patient is fatigued and not feeling well at visit.          Follow up plan: No follow-ups on file.       "

## 2024-11-29 NOTE — ED Provider Notes (Signed)
 "  [facility] Provider Note    Event Date/Time   First MD Initiated Contact with Patient 11/29/24 1520     (approximate)   History   Shortness of Breath   HPI  Ralph Leon is a 89 y.o. male presents to the emergency department today from his primary care doctor's office.  The patient had gone there for a scheduled appointment.  He states he has been feeling okay in the morning.  When he got to his office however they found to be hypoxic, tachycardic and hypotensive.  He states that he did feel somewhat weak and short of breath at the office.  Says that he feels about the same at the time of my exam.       Physical Exam   Triage Vital Signs: ED Triage Vitals  Encounter Vitals Group     BP 11/29/24 1503 (!) 108/43     Girls Systolic BP Percentile --      Girls Diastolic BP Percentile --      Boys Systolic BP Percentile --      Boys Diastolic BP Percentile --      Pulse Rate 11/29/24 1503 88     Resp 11/29/24 1503 20     Temp 11/29/24 1503 98.3 F (36.8 C)     Temp Source 11/29/24 1503 Oral     SpO2 11/29/24 1503 100 %     Weight 11/29/24 1500 123 lb 12.8 oz (56.2 kg)     Height 11/29/24 1500 5' 4 (1.626 m)     Head Circumference --      Peak Flow --      Pain Score 11/29/24 1500 0     Pain Loc --      Pain Education --      Exclude from Growth Chart --     Most recent vital signs: Vitals:   11/29/24 1503 11/29/24 1517  BP: (!) 108/43   Pulse: 88   Resp: 20   Temp: 98.3 F (36.8 C)   SpO2: 100% 100%    General: Awake, alert, oriented. CV:  Good peripheral perfusion. Regular rate and rhythm. Resp:  Normal effort. Lungs clear. Abd:  No distention.  Other:  No lower extremity edema.    ED Results / Procedures / Treatments   Labs (all labs ordered are listed, but only abnormal results are displayed) Labs Reviewed  BASIC METABOLIC PANEL WITH GFR  CBC     EKG  I, Guadalupe Eagles, attending physician, personally viewed and  interpreted this EKG  EKG Time: 1503 Rate: 87 Rhythm: normal sinus rhythm Axis: normal Intervals: qtc 454 QRS: narrow, q waves v1 ST changes: no st elevation Impression: abnormal ekg   RADIOLOGY I independently interpreted and visualized the CXR. My interpretation: No pneumonia Radiology interpretation:  IMPRESSION:  1. Emphysema. No pneumonia or pulmonary edema.  2. Fullness of the right hilar region measuring 2.5 cm.while this may be  artifactual due to patient rotation, lymphadenopathy or hilar mass could also  have this appearance. Nonemergent chest CT with iv contrast recommended for  further characterization.     PROCEDURES:  Critical Care performed: Yes  CRITICAL CARE Performed by: Guadalupe Eagles   Total critical care time: 30 minutes  Critical care time was exclusive of separately billable procedures and treating other patients.  Critical care was necessary to treat or prevent imminent or life-threatening deterioration.  Critical care was time spent personally by me on the following activities: development of  treatment plan with patient and/or surrogate as well as nursing, discussions with consultants, evaluation of patient's response to treatment, examination of patient, obtaining history from patient or surrogate, ordering and performing treatments and interventions, ordering and review of laboratory studies, ordering and review of radiographic studies, pulse oximetry and re-evaluation of patient's condition.   Procedures    MEDICATIONS ORDERED IN ED: Medications - No data to display   IMPRESSION / MDM / ASSESSMENT AND PLAN / ED COURSE  I reviewed the triage vital signs and the nursing notes.                              Differential diagnosis includes, but is not limited to, pneumonia, anemia, UTI, dehydration  Patient's presentation is most consistent with acute presentation with potential threat to life or bodily function.   The patient is on  the cardiac monitor to evaluate for evidence of arrhythmia and/or significant heart rate changes.  Patient presented to the emergency department today from doctor's office because of concerns for tachycardia, hypotensive and hypoxic.  However upon arrival here in the emergency department he is neither hypoxic nor tachycardic.  He is slightly hypotensive.  Blood work shows anemia.  Patient did have a recent hospitalization for GI bleed and anemia.  He was guaiac positive here today.  Did discuss and consent patient for blood transfusion.  Will also order Protonix .  Will discuss with hospitalist service for admission.      FINAL CLINICAL IMPRESSION(S) / ED DIAGNOSES   Final diagnoses:  Shortness of breath  Anemia, unspecified type  Gastrointestinal hemorrhage, unspecified gastrointestinal hemorrhage type      Note:  This document was prepared using Dragon voice recognition software and may include unintentional dictation errors.    Floy Roberts, MD 11/29/24 [phone]  "

## 2024-11-30 DIAGNOSIS — D500 Iron deficiency anemia secondary to blood loss (chronic): Secondary | ICD-10-CM | POA: Diagnosis not present

## 2024-11-30 DIAGNOSIS — D62 Acute posthemorrhagic anemia: Secondary | ICD-10-CM | POA: Insufficient documentation

## 2024-11-30 DIAGNOSIS — N1831 Chronic kidney disease, stage 3a: Secondary | ICD-10-CM | POA: Diagnosis not present

## 2024-11-30 DIAGNOSIS — K922 Gastrointestinal hemorrhage, unspecified: Secondary | ICD-10-CM | POA: Diagnosis not present

## 2024-11-30 LAB — HEMOGLOBIN AND HEMATOCRIT, BLOOD
HCT: 19.8 % — ABNORMAL LOW (ref 39.0–52.0)
Hemoglobin: 6.2 g/dL — ABNORMAL LOW (ref 13.0–17.0)

## 2024-11-30 LAB — CBC
HCT: 20 % — ABNORMAL LOW (ref 39.0–52.0)
Hemoglobin: 6.4 g/dL — ABNORMAL LOW (ref 13.0–17.0)
MCH: 29.2 pg (ref 26.0–34.0)
MCHC: 32 g/dL (ref 30.0–36.0)
MCV: 91.3 fL (ref 80.0–100.0)
Platelets: 188 K/uL (ref 150–400)
RBC: 2.19 MIL/uL — ABNORMAL LOW (ref 4.22–5.81)
RDW: 20.7 % — ABNORMAL HIGH (ref 11.5–15.5)
WBC: 6.2 K/uL (ref 4.0–10.5)
nRBC: 0 % (ref 0.0–0.2)

## 2024-11-30 LAB — BASIC METABOLIC PANEL WITH GFR
Anion gap: 7 (ref 5–15)
BUN: 25 mg/dL — ABNORMAL HIGH (ref 8–23)
CO2: 26 mmol/L (ref 22–32)
Calcium: 8.6 mg/dL — ABNORMAL LOW (ref 8.9–10.3)
Chloride: 107 mmol/L (ref 98–111)
Creatinine, Ser: 1.08 mg/dL (ref 0.61–1.24)
GFR, Estimated: >60 mL/min
Glucose, Bld: 88 mg/dL (ref 70–99)
Potassium: 3.9 mmol/L (ref 3.5–5.1)
Sodium: 140 mmol/L (ref 135–145)

## 2024-11-30 LAB — HEMOGLOBIN: Hemoglobin: 8.7 g/dL — ABNORMAL LOW (ref 13.0–17.0)

## 2024-11-30 LAB — TROPONIN T, HIGH SENSITIVITY: Troponin T High Sensitivity: 56 ng/L — ABNORMAL HIGH (ref 0–19)

## 2024-11-30 LAB — PREPARE RBC (CROSSMATCH)

## 2024-11-30 MED ORDER — CYANOCOBALAMIN 1000 MCG/ML IJ SOLN
1000.0000 ug | Freq: Once | INTRAMUSCULAR | Status: AC
  Administered 2024-11-30: 1000 ug via INTRAMUSCULAR
  Filled 2024-11-30 (×2): qty 1

## 2024-11-30 MED ORDER — NA SULFATE-K SULFATE-MG SULF 17.5-3.13-1.6 GM/177ML PO SOLN
0.5000 | Freq: Once | ORAL | Status: AC
  Administered 2024-12-01: 177 mL via ORAL

## 2024-11-30 MED ORDER — NA SULFATE-K SULFATE-MG SULF 17.5-3.13-1.6 GM/177ML PO SOLN
0.5000 | Freq: Once | ORAL | Status: AC
  Administered 2024-11-30: 177 mL via ORAL
  Filled 2024-11-30: qty 1

## 2024-11-30 MED ORDER — SODIUM CHLORIDE 0.9 % IV SOLN
INTRAVENOUS | Status: DC
  Administered 2024-12-02: 20 mL/h via INTRAVENOUS

## 2024-11-30 MED ORDER — IRON SUCROSE 300 MG IVPB - SIMPLE MED
300.0000 mg | Freq: Once | Status: AC
  Administered 2024-11-30: 300 mg via INTRAVENOUS
  Filled 2024-11-30: qty 300

## 2024-11-30 MED ORDER — SODIUM CHLORIDE 0.9% IV SOLUTION: Freq: Once | INTRAVENOUS | Status: AC

## 2024-11-30 NOTE — Plan of Care (Signed)
  Problem: Clinical Measurements: Goal: Will remain free from infection Outcome: Progressing Goal: Cardiovascular complication will be avoided Outcome: Progressing   Problem: Nutrition: Goal: Adequate nutrition will be maintained Outcome: Progressing   Problem: Elimination: Goal: Will not experience complications related to bowel motility Outcome: Progressing

## 2024-11-30 NOTE — Hospital Course (Signed)
 Ralph Leon is a 89 y.o. male with medical history significant of iron  deficiency anemia secondary to GI bleed, COPD, HTN, CKD stage IIIb, stroke, sent by PCP for evaluation of hypoxia tachycardia.  Patient complaining about feeling fatigued, and worsening of exertional dyspnea.  Patient also noticed intermittent rectal bleeding. Hb was 5.9, patient was given a unit of PRBC. He also received IV iron , anemia has improved. Colonoscopy was performed on 1/10, reveals a sigmoid ulcer, potentially from ischemia.  GI has cleared patient for discharge.

## 2024-11-30 NOTE — Progress Notes (Signed)
" °  Progress Note   Patient: Ralph Leon FMW:[id number] DOB: 1936/09/25 DOA: 11/29/2024     1 DOS: the patient was seen and examined on 11/30/2024   Brief hospital course: Ralph Leon is a 89 y.o. male with medical history significant of iron  deficiency anemia secondary to GI bleed, COPD, HTN, CKD stage IIIb, stroke, sent by PCP for evaluation of hypoxia tachycardia.  Patient complaining about feeling fatigued, and worsening of exertional dyspnea.  Patient also noticed intermittent rectal bleeding. Globin was 5.9, patient was given a unit of PRBC.   Principal Problem:   Lower GI bleed Active Problems:   GI bleeding   Iron  deficiency anemia   Acute blood loss anemia   Assessment and Plan: Acute on chronic symptomatic anemia,  iron  deficiency anemia. Chronic hematochezia Patient received 1 unit of PRBC, hemoglobin only went up to 6.4, will give another unit of PRBC today.  Patient also has significant iron  deficiency, give IV iron . B12 borderline, check homocystine, give a dose of B12 injection. Continue to follow hemoglobin. GI consult obtained.  CKD stage IIIA, not 3B Renal function is better.   Acute respiratory failure with hypoxia, resolved COPD. Patient had a significant hypoxemia at time of admission, he also complained short of breath.  He received a 1 unit PRBC, symptom has resolved, he is off oxygen.  He does not seem to have COPD exacerbation.   Dementia - Continue Aricept    HTN Continue hold blood pressure medicine.   Hx of stroke - Continue aspirin  and statin      Subjective:  Patient doing well today, denies any short of breath.  Off oxygen.  Physical Exam: Vitals:   11/29/24 2157 11/30/24 0004 11/30/24 0408 11/30/24 0723  BP: (!) 121/57 (!) 128/50 (!) 113/53 (!) 131/52  Pulse: 64 68 64 70  Resp: 16 16 16 16   Temp: 97.9 F (36.6 C) 98 F (36.7 C) 97.9 F (36.6 C) 98.7 F (37.1 C)  TempSrc: Oral Oral Oral   SpO2: 99% 99% 100% 97%  Weight:       Height:       General exam: Ill-appearing and pale Respiratory system: Decreased breath sounds. Respiratory effort normal. Cardiovascular system: S1 & S2 heard, RRR. No JVD, murmurs, rubs, gallops or clicks. No pedal edema. Gastrointestinal system: Abdomen is nondistended, soft and nontender. No organomegaly or masses felt. Normal bowel sounds heard. Central nervous system: Alert and oriented. No focal neurological deficits. Extremities: Symmetric 5 x 5 power. Skin: No rashes, lesions or ulcers Psychiatry: Judgement and insight appear normal. Mood & affect appropriate.    Data Reviewed:  Lab results reviewed  Family Communication: Patient does not have any family.  Disposition: Status is: Inpatient Remains inpatient appropriate because: Severity of disease, IV treatment.     Time spent: 35 minutes  Author: Murvin Mana, MD 11/30/2024 12:16 PM  For on call review [email or web address].    "

## 2024-11-30 NOTE — Progress Notes (Signed)
..  TOC Brief note  Patient is from home, admitted with anemia and suspected GI bleed. Transfused I 1 Unit PRBCs. The medical record has been reviewed. The patient has a payor and PCP on record. No SDOH interventions needed.   No recs at this time, please outreach to [facility] if needs are identified.

## 2024-11-30 NOTE — Plan of Care (Signed)

## 2024-11-30 NOTE — Consult Note (Addendum)
 "  Ralph Leon , MD [address], [address], [place], [place], [address] Phone: [phone] Fax: [phone]  Consultation  Referring Provider:     No ref. provider found Primary Care Physician:  [name], NP Primary Gastroenterologist:  Ralph Leon          Reason for Consultation:     iron  deficiency anemia   Date of Admission:  11/29/2024 Date of Consultation:  11/30/2024         HPI:   Ralph Leon is a 89 y.o. male who has seen Ralph Leon in October 2025 for iron  deficiency anemia.  At that point of time hemoglobin was 8.1 g with an iron  of 9 iron  percentage saturation of 3% with positive receiving IV iron .  Last colonoscopy over 20 years back plan was for an EGD and colonoscopy at that point of time.A CT scan of the chest and abdomen with contrast showed diverticulosis without diverticulitis enlarged prostate.  On 10/11/2024 admitted to the hospital for a GI bleed patient was initially reluctant to undergo an EGD and colonoscopy so he refused to have a colonoscopy but agreed to have an upper endoscopy which she had on 10/13/2024 showing LA grade a esophagitis and a few erosions in the stomach was recommended to commence on a PPI.  Presented to the hospital yesterday with shortness of breath and feeling tired and new complaints of rectal bleeding but no hematemesis occurs every time he wipes 1-2 times a week from the breathing point of view he presented with some hypoxia which is being felt secondary to anemia and is COPD stable CKD stage III.  I have been consulted for the rectal bleeding.  On admission hemoglobin 6.4 g and MCV of 91.3 hemoglobin 1 month back was 8.2 g Patient himself denies any abdominal pain , bleeding , vomiting , or blood loss. No other complaints.  Past Medical History:  Diagnosis Date   COPD (chronic obstructive pulmonary disease) (HCC)    Hypertension    Stroke [facility])     Past Surgical History:  Procedure Laterality Date    ESOPHAGOGASTRODUODENOSCOPY N/A 10/13/2024   Procedure: EGD (ESOPHAGOGASTRODUODENOSCOPY);  Surgeon: Jinny Carmine, MD;  Location: [facility] ENDOSCOPY;  Service: Endoscopy;  Laterality: N/A;    Prior to Admission medications  Medication Sig Start Date End Date Taking? Authorizing Provider  albuterol  (VENTOLIN  HFA) 108 (90 Base) MCG/ACT inhaler INHALE 2 PUFFS BY MOUTH EVERY 6 HOURS AS NEEDED FOR WHEEZING FOR SHORTNESS OF BREATH 09/05/24  Yes [name], NP  aspirin  EC 81 MG tablet Take 1 tablet (81 mg total) by mouth daily. Swallow whole. 06/02/21  Yes Darliss Rogue, MD  atorvastatin  (LIPITOR) 40 MG tablet Take 1 tablet by mouth once daily 11/09/24  Yes [name], NP  donepezil  (ARICEPT ) 5 MG tablet Take 5 mg by mouth at bedtime. 08/14/24  Yes [provider]  magnesium  oxide (MAG-OX) 400 (240 Mg) MG tablet Take 400 mg by mouth daily.   Yes [provider]  pantoprazole  (PROTONIX ) 40 MG tablet Take 1 tablet (40 mg total) by mouth daily. 10/23/24  Yes Ponnala, Shruthi, MD  prochlorperazine  (COMPAZINE ) 5 MG tablet Take 1 tablet (5 mg total) by mouth every 6 (six) hours as needed for nausea or vomiting. 10/22/24  Yes Ponnala, Shruthi, MD  senna-docusate (SENOKOT-S) 8.6-50 MG tablet Take 1 tablet by mouth daily.   Yes [provider]  vitamin D3 (CHOLECALCIFEROL) 25 MCG tablet Take 1,000 Units by mouth daily.   Yes [provider]  [Paused] hydrochlorothiazide  (HYDRODIURIL ) 12.5 MG tablet Take 12.5 mg by mouth daily. Wait to take this until your doctor or other care provider tells you to start again.    [provider]  [Paused] lisinopril  (ZESTRIL ) 20 MG tablet Take 1 tablet by mouth once daily Wait to take this until your doctor or other care provider tells you to start again. 07/17/24   [name], NP    Family History  Problem Relation Age of Onset   Diabetes Mother    Heart attack Father      Social History[1]  Allergies as of  11/29/2024   (No Known Allergies)    Review of Systems:    All systems reviewed and negative except where noted in HPI.   Physical Exam:  Vital signs in last 24 hours: Temp:  [97.5 F (36.4 C)-98.7 F (37.1 C)] 98.7 F (37.1 C) (01/08 0723) Pulse Rate:  [64-135] 70 (01/08 0723) Resp:  [16-20] 16 (01/08 0723) BP: (84-142)/(42-83) 131/52 (01/08 0723) SpO2:  [78 %-100 %] 97 % (01/08 0723) Weight:  [56.2 kg] 56.2 kg (01/07 1500) Last BM Date : 11/29/24 General:   Pleasant, cooperative in NAD, appears frail  Head:  Normocephalic and atraumatic. Eyes:   No icterus.   Conjunctiva pink. PERRLA. Ears:  Normal auditory acuity. Neck:  Supple; no masses or thyroidomegaly Lungs: Respirations even and unlabored. Lungs clear to auscultation bilaterally.   No wheezes, crackles, or rhonchi.  Heart:  Regular rate and rhythm;  Without murmur, clicks, rubs or gallops Abdomen:  Soft, nondistended, nontender. Normal bowel sounds. No appreciable masses or hepatomegaly.  No rebound or guarding.  Neurologic:  Alert and oriented x3;  grossly normal neurologically. Skin:  Intact without significant lesions or rashes. Cervical Nodes:  No significant cervical adenopathy. Psych:  Alert and cooperative. Normal affect.  LAB RESULTS: Recent Labs    11/29/24 1521 11/30/24 0015 11/30/24 0558  WBC 6.7  --  6.2  HGB 5.9* 6.2* 6.4*  HCT 20.4* 19.8* 20.0*  PLT 218  --  188   BMET Recent Labs    11/29/24 1521 11/30/24 0558  NA 143 140  K 4.4 3.9  CL 107 107  CO2 23 26  GLUCOSE 107* 88  BUN 30* 25*  CREATININE 1.26* 1.08  CALCIUM  9.0 8.6*   LFT No results for input(s): PROT, ALBUMIN , AST, ALT, ALKPHOS, BILITOT, BILIDIR, IBILI in the last 72 hours. PT/INR No results for input(s): LABPROT, INR in the last 72 hours.  STUDIES: DG Chest 2 View Result Date: 11/29/2024 EXAM: 2 VIEW(S) XRAY OF THE CHEST 11/29/2024 03:38:00 PM COMPARISON: 10/17/2024 CLINICAL HISTORY: sob FINDINGS:  LUNGS AND PLEURA: Emphysema. Fullness of the right hilar region measuring 2.5 cm. No pleural effusion. No pneumothorax. HEART AND MEDIASTINUM: Tortuous aorta with aortic atherosclerosis. BONES AND SOFT TISSUES: Diffuse osteopenia. Multilevel degenerative disc disease of the spine. IMPRESSION: 1. Emphysema. No pneumonia or pulmonary edema. 2. Fullness of the right hilar region measuring 2.5 cm.while this may be artifactual due to patient rotation, lymphadenopathy or hilar mass could also have this appearance. Nonemergent chest CT with iv contrast recommended for further characterization. Electronically signed by: Rogelia Myers MD MD 11/29/2024 03:42 PM EST RP Workstation: [id number]      Impression / Plan:   Alano Blasco is a 89 y.o. y/o male with a history of iron  deficiency anemia second admission over the past few weeks for concerns of bleeding recent upper endoscopy by Dr. Jinny back in November 2025  showed LA grade a esophagitis the patient had declined a colonoscopy at that point of time presents to the hospital with further concerns of rectal bleeding drop in hemoglobin of over 2 g from baseline of around 8 g.  Receives IV iron  as an outpatient.  Plan   Monitor cbc and transfuse as needed  IF has acute severe bleeding then needs CT angiogram if not will plan for colonoscopy tomorrow as long as he is cleaned out. Orders will be placed  I have discussed alternative options, risks & benefits,  which include, but are not limited to, bleeding, infection, perforation,respiratory complication & drug reaction.  The patient agrees with this plan & written consent will be obtained.     Thank you for involving me in the care of this patient.      LOS: 1 day   Ralph Kung, MD  11/30/2024, 10:23 AM         [1]  Social History Tobacco Use   Smoking status: Former    Current packs/day: 0.25    Average packs/day: 0.3 packs/day for 64.0 years (16.0 ttl pk-yrs)    Types: Cigarettes   Smokeless  tobacco: Never   Tobacco comments:    5-6 a day. 03/06/2021  Vaping Use   Vaping status: Never Used  Substance Use Topics   Alcohol use: Not Currently   Drug use: Not Currently   "

## 2024-12-01 ENCOUNTER — Inpatient Hospital Stay

## 2024-12-01 DIAGNOSIS — D500 Iron deficiency anemia secondary to blood loss (chronic): Secondary | ICD-10-CM | POA: Diagnosis not present

## 2024-12-01 DIAGNOSIS — N1831 Chronic kidney disease, stage 3a: Secondary | ICD-10-CM | POA: Diagnosis not present

## 2024-12-01 LAB — BASIC METABOLIC PANEL WITH GFR
Anion gap: 10 (ref 5–15)
BUN: 17 mg/dL (ref 8–23)
CO2: 26 mmol/L (ref 22–32)
Calcium: 8.9 mg/dL (ref 8.9–10.3)
Chloride: 107 mmol/L (ref 98–111)
Creatinine, Ser: 1.03 mg/dL (ref 0.61–1.24)
GFR, Estimated: >60 mL/min
Glucose, Bld: 107 mg/dL — ABNORMAL HIGH (ref 70–99)
Potassium: 4.2 mmol/L (ref 3.5–5.1)
Sodium: 143 mmol/L (ref 135–145)

## 2024-12-01 LAB — TYPE AND SCREEN
ABO/RH(D): O POS
Antibody Screen: NEGATIVE
Unit division: 0
Unit division: 0

## 2024-12-01 LAB — BPAM RBC
Blood Product Expiration Date: 202601092359
Blood Product Expiration Date: 202601092359
ISSUE DATE / TIME: 202601071830
ISSUE DATE / TIME: 202601081223
Unit Type and Rh: 9500
Unit Type and Rh: 9500

## 2024-12-01 LAB — MAGNESIUM: Magnesium: 2.4 mg/dL (ref 1.7–2.4)

## 2024-12-01 LAB — HEMOGLOBIN
Hemoglobin: 9.2 g/dL — ABNORMAL LOW (ref 13.0–17.0)
Hemoglobin: 10.4 g/dL — ABNORMAL LOW (ref 13.0–17.0)

## 2024-12-01 LAB — HOMOCYSTEINE: Homocysteine: 15.9 umol/L (ref 0.0–21.3)

## 2024-12-01 MED ORDER — CYANOCOBALAMIN 1000 MCG/ML IJ SOLN
1000.0000 ug | Freq: Once | INTRAMUSCULAR | Status: AC
  Administered 2024-12-01: 1000 ug via INTRAMUSCULAR
  Filled 2024-12-01: qty 1

## 2024-12-01 MED ORDER — PEG 3350-KCL-NA BICARB-NACL 420 G PO SOLR
4000.0000 mL | Freq: Once | ORAL | Status: AC
  Administered 2024-12-01: 4000 mL via ORAL
  Filled 2024-12-01: qty 4000

## 2024-12-01 NOTE — Progress Notes (Signed)
 GI note  Not cleaned out today - procedure rescheduled for tomorrow with Dr Aundria - prep orders in    Dr Ruel Kung MD,MRCP [facility]) Gastroenterology/Hepatology Pager: [phone]

## 2024-12-01 NOTE — Progress Notes (Signed)
" °  Progress Note   Patient: Ralph Leon FMW:[id number] DOB: [birth year] DOA: 11/29/2024     2 DOS: the patient was seen and examined on 12/01/2024   Brief hospital course: Ralph Leon is a 89 y.o. male with medical history significant of iron  deficiency anemia secondary to GI bleed, COPD, HTN, CKD stage IIIb, stroke, sent by PCP for evaluation of hypoxia tachycardia.  Patient complaining about feeling fatigued, and worsening of exertional dyspnea.  Patient also noticed intermittent rectal bleeding. Globin was 5.9, patient was given a unit of PRBC.   Principal Problem:   Lower GI bleed Active Problems:   GI bleeding   Iron  deficiency anemia   Acute blood loss anemia   CKD stage 3a, GFR 45-59 ml/min (HCC)   Assessment and Plan: Acute on chronic symptomatic anemia,  iron  deficiency anemia. Chronic hematochezia Patient received 1 unit of PRBC, hemoglobin only went up to 6.4, will give another unit of PRBC today.  Patient also has significant iron  deficiency, gave IV iron . B12 borderline, received a dose of B12 injection.  Sitting level still pending, will give another dose of B12 injection. Will globin increased to 9.2 today.  Patient scheduled for colonoscopy today, but about was not properly prepared, rescheduled for tomorrow.   CKD stage IIIA, not 3B Renal function is better.   Acute respiratory failure with hypoxia, resolved COPD. Patient had a significant hypoxemia at time of admission, he also complained short of breath.  He received a 1 unit PRBC, symptom has resolved, he is off oxygen.  He does not seem to have COPD exacerbation.     Dementia - Continue Aricept    HTN Continue hold blood pressure medicine.   Hx of stroke - Continue aspirin  and statin       Subjective:  Patient doing well, no additional rectal bleeding or black stool.  Physical Exam: Vitals:   11/30/24 2352 12/01/24 0418 12/01/24 0814 12/01/24 1135  BP: (!) 159/70 (!) 144/62 139/61 133/62   Pulse: 70 70 68 67  Resp: 16 16 15    Temp:  97.7 F (36.5 C) 98 F (36.7 C) 98.5 F (36.9 C)  TempSrc:  Oral Oral   SpO2: 100% 100% 98% 98%  Weight:      Height:       General exam: Appears calm and comfortable  Respiratory system: Clear to auscultation. Respiratory effort normal. Cardiovascular system: S1 & S2 heard, RRR. No JVD, murmurs, rubs, gallops or clicks. No pedal edema. Gastrointestinal system: Abdomen is nondistended, soft and nontender. No organomegaly or masses felt. Normal bowel sounds heard. Central nervous system: Alert and oriented. No focal neurological deficits. Extremities: Symmetric 5 x 5 power. Skin: No rashes, lesions or ulcers Psychiatry: Judgement and insight appear normal. Mood & affect appropriate.    Data Reviewed:  Lab results reviewed.  Family Communication: None  Disposition: Status is: Inpatient Remains inpatient appropriate because: Severity of disease, inpatient procedure     Time spent: 35 minutes  Author: Murvin Mana, MD 12/01/2024 2:15 PM  For on call review [email or web address].    "

## 2024-12-01 NOTE — H&P (View-Only) (Signed)
 GI note  Not cleaned out today - procedure rescheduled for tomorrow with Dr Aundria - prep orders in    Dr Ruel Kung MD,MRCP [facility]) Gastroenterology/Hepatology Pager: [phone]

## 2024-12-01 NOTE — Plan of Care (Signed)
" °  Problem: Clinical Measurements: Goal: Ability to maintain clinical measurements within normal limits will improve Outcome: Progressing   Problem: Elimination: Goal: Will not experience complications related to bowel motility Outcome: Progressing   Problem: Coping: Goal: Level of anxiety will decrease Outcome: Progressing   Problem: Safety: Goal: Ability to remain free from injury will improve Outcome: Progressing   Problem: Pain Managment: Goal: General experience of comfort will improve and/or be controlled Outcome: Progressing   Problem: Skin Integrity: Goal: Risk for impaired skin integrity will decrease Outcome: Progressing   "

## 2024-12-02 ENCOUNTER — Encounter: Payer: Self-pay | Admitting: Internal Medicine

## 2024-12-02 ENCOUNTER — Encounter: Admission: EM | Disposition: A | Payer: Self-pay | Source: Ambulatory Visit | Attending: Internal Medicine

## 2024-12-02 ENCOUNTER — Encounter: Payer: Self-pay | Admitting: Oncology

## 2024-12-02 ENCOUNTER — Inpatient Hospital Stay: Payer: Self-pay

## 2024-12-02 ENCOUNTER — Other Ambulatory Visit: Payer: Self-pay

## 2024-12-02 DIAGNOSIS — K922 Gastrointestinal hemorrhage, unspecified: Secondary | ICD-10-CM | POA: Diagnosis not present

## 2024-12-02 DIAGNOSIS — D500 Iron deficiency anemia secondary to blood loss (chronic): Secondary | ICD-10-CM | POA: Diagnosis not present

## 2024-12-02 DIAGNOSIS — N1831 Chronic kidney disease, stage 3a: Secondary | ICD-10-CM | POA: Diagnosis not present

## 2024-12-02 HISTORY — PX: HEMOSTASIS CLIP PLACEMENT: SHX6857

## 2024-12-02 HISTORY — PX: BIOPSY OF SKIN SUBCUTANEOUS TISSUE AND/OR MUCOUS MEMBRANE: SHX6741

## 2024-12-02 HISTORY — PX: COLONOSCOPY: SHX5424

## 2024-12-02 HISTORY — PX: POLYPECTOMY: SHX149

## 2024-12-02 LAB — HEMOGLOBIN: Hemoglobin: 10 g/dL — ABNORMAL LOW (ref 13.0–17.0)

## 2024-12-02 MED ORDER — FERROUS SULFATE 325 (65 FE) MG PO TABS
325.0000 mg | ORAL_TABLET | Freq: Every day | ORAL | 0 refills | Status: AC
  Filled 2024-12-02: qty 60, 60d supply, fill #0

## 2024-12-02 MED ORDER — PROPOFOL 500 MG/50ML IV EMUL
INTRAVENOUS | Status: DC | PRN
  Administered 2024-12-02: 50 mg via INTRAVENOUS
  Administered 2024-12-02: 150 ug/kg/min via INTRAVENOUS

## 2024-12-02 MED ORDER — LIDOCAINE HCL (CARDIAC) PF 100 MG/5ML IV SOSY
PREFILLED_SYRINGE | INTRAVENOUS | Status: DC | PRN
  Administered 2024-12-02: 100 mg via INTRAVENOUS

## 2024-12-02 MED ORDER — EPHEDRINE SULFATE (PRESSORS) 25 MG/5ML IV SOSY
PREFILLED_SYRINGE | INTRAVENOUS | Status: DC | PRN
  Administered 2024-12-02: 5 mg via INTRAVENOUS
  Administered 2024-12-02: 10 mg via INTRAVENOUS

## 2024-12-02 NOTE — Anesthesia Preprocedure Evaluation (Signed)
"                                    Anesthesia Evaluation  Patient identified by MRN, date of birth, ID band Patient awake    Reviewed: Allergy & Precautions, H&P , NPO status , Patient's Chart, lab work & pertinent test results  Airway Mallampati: II  TM Distance: >3 FB Neck ROM: Full    Dental  (+) Missing, Chipped   Pulmonary neg pulmonary ROS, COPD, former smoker   Pulmonary exam normal breath sounds clear to auscultation       Cardiovascular hypertension, + CAD  negative cardio ROS Normal cardiovascular exam Rhythm:Regular Rate:Tachycardia     Neuro/Psych  PSYCHIATRIC DISORDERS     Dementia CVA negative neurological ROS  negative psych ROS   GI/Hepatic Neg liver ROS, PUD,,,  Endo/Other  negative endocrine ROS    Renal/GU Renal diseasenegative Renal ROS  negative genitourinary   Musculoskeletal negative musculoskeletal ROS (+)    Abdominal   Peds negative pediatric ROS (+)  Hematology negative hematology ROS (+) Blood dyscrasia, anemia   Anesthesia Other Findings   Reproductive/Obstetrics negative OB ROS                              Anesthesia Physical Anesthesia Plan  ASA: 3 and emergent  Anesthesia Plan: General and MAC   Post-op Pain Management: Minimal or no pain anticipated   Induction: Intravenous  PONV Risk Score and Plan: 1  Airway Management Planned: Nasal Cannula and Simple Face Mask  Additional Equipment:   Intra-op Plan:   Post-operative Plan:   Informed Consent: I have reviewed the patients History and Physical, chart, labs and discussed the procedure including the risks, benefits and alternatives for the proposed anesthesia with the patient or authorized representative who has indicated his/her understanding and acceptance.     Dental advisory given  Plan Discussed with: CRNA  Anesthesia Plan Comments:         Anesthesia Quick Evaluation  "

## 2024-12-02 NOTE — Discharge Summary (Signed)
 " Physician Discharge Summary   Patient: Ralph Leon MRN: [id number] DOB: Jul 23, 1936  Admit date:     11/29/2024  Discharge date: 12/02/2024  Discharge Physician: Murvin Mana   PCP: [name], NP   Recommendations at discharge:   Follow-up with PCP in 1 week.  Discharge Diagnoses: Principal Problem:   Lower GI bleed Active Problems:   GI bleeding   Iron  deficiency anemia   Acute blood loss anemia   CKD stage 3a, GFR 45-59 ml/min (HCC)  Resolved Problems:   * No resolved hospital problems. *  Hospital Course: Ralph Leon is a 89 y.o. male with medical history significant of iron  deficiency anemia secondary to GI bleed, COPD, HTN, CKD stage IIIb, stroke, sent by PCP for evaluation of hypoxia tachycardia.  Patient complaining about feeling fatigued, and worsening of exertional dyspnea.  Patient also noticed intermittent rectal bleeding. Hb was 5.9, patient was given a unit of PRBC. He also received IV iron , anemia has improved. Colonoscopy was performed on 1/10, reveals a sigmoid ulcer, potentially from ischemia.  GI has cleared patient for discharge.  Assessment and Plan: Acute on chronic symptomatic anemia,  iron  deficiency anemia. Chronic hematochezia Patient received 1 unit of PRBC, hemoglobin only went up to 6.4, will give another unit of PRBC today.  Patient also has significant iron  deficiency, gave IV iron . B12 borderline, received a dose of B12 injection.  Homocystine level was normal.  No need for B12 chronically. Patient has a colonoscopy performed today, showed a sigmoid ulceration, no active bleeding. Hemoglobin has improved to 10.0.  Medically stable for discharge.   CKD stage IIIA, not 3B Renal function is better.   Acute respiratory failure with hypoxia, resolved COPD. Patient had a significant hypoxemia at time of admission, he also complained short of breath.  He received a 1 unit PRBC, symptom has resolved, he is off oxygen.  He does not seem to  have COPD exacerbation.     Dementia - Continue Aricept    HTN Continue hold blood pressure medicine.   Hx of stroke - Continue aspirin  and statin        Consultants: GI. Procedures performed: Colonoscopy. Disposition: Home health Diet recommendation:  Discharge Diet Orders (From admission, onward)     Start     Ordered   12/02/24 0000  Diet - low sodium heart healthy        12/02/24 1321           Cardiac diet DISCHARGE MEDICATION: Allergies as of 12/02/2024   No Known Allergies      Medication List     STOP taking these medications    hydrochlorothiazide  12.5 MG tablet Commonly known as: HYDRODIURIL        TAKE these medications    albuterol  108 (90 Base) MCG/ACT inhaler Commonly known as: VENTOLIN  HFA INHALE 2 PUFFS BY MOUTH EVERY 6 HOURS AS NEEDED FOR WHEEZING FOR SHORTNESS OF BREATH   aspirin  EC 81 MG tablet Take 1 tablet (81 mg total) by mouth daily. Swallow whole.   atorvastatin  40 MG tablet Commonly known as: LIPITOR Take 1 tablet by mouth once daily   donepezil  5 MG tablet Commonly known as: ARICEPT  Take 5 mg by mouth at bedtime.   lisinopril  20 MG tablet Commonly known as: ZESTRIL  Take 1 tablet by mouth once daily   magnesium  oxide 400 (240 Mg) MG tablet Commonly known as: MAG-OX Take 400 mg by mouth daily.   pantoprazole  40 MG tablet Commonly known as: PROTONIX  Take 1  tablet (40 mg total) by mouth daily.   prochlorperazine  5 MG tablet Commonly known as: COMPAZINE  Take 1 tablet (5 mg total) by mouth every 6 (six) hours as needed for nausea or vomiting.   senna-docusate 8.6-50 MG tablet Commonly known as: Senokot-S Take 1 tablet by mouth daily.   vitamin D3 25 MCG tablet Commonly known as: CHOLECALCIFEROL Take 1,000 Units by mouth daily.        Contact information for follow-up providers     [name], NP Follow up in 1 week(s).   Specialty: Nurse Practitioner Why: hospital follow up Contact  information: [address] [place] [address] [phone]              Contact information for after-discharge care     Home Medical Care     [facility]) .   Service: Home Health Services Contact information: [address] [place]  [address] [phone]                    Discharge Exam: Ralph Leon   11/29/24 1500  Weight: 56.2 kg   General exam: Appears calm and comfortable  Respiratory system: Clear to auscultation. Respiratory effort normal. Cardiovascular system: S1 & S2 heard, RRR. No JVD, murmurs, rubs, gallops or clicks. No pedal edema. Gastrointestinal system: Abdomen is nondistended, soft and nontender. No organomegaly or masses felt. Normal bowel sounds heard. Central nervous system: Alert and oriented. No focal neurological deficits. Extremities: Symmetric 5 x 5 power. Skin: No rashes, lesions or ulcers Psychiatry: Judgement and insight appear normal. Mood & affect appropriate.    Condition at discharge: good  The results of significant diagnostics from this hospitalization (including imaging, microbiology, ancillary and laboratory) are listed below for reference.   Imaging Studies: DG Chest 2 View Result Date: 11/29/2024 EXAM: 2 VIEW(S) XRAY OF THE CHEST 11/29/2024 03:38:00 PM COMPARISON: 10/17/2024 CLINICAL HISTORY: sob FINDINGS: LUNGS AND PLEURA: Emphysema. Fullness of the right hilar region measuring 2.5 cm. No pleural effusion. No pneumothorax. HEART AND MEDIASTINUM: Tortuous aorta with aortic atherosclerosis. BONES AND SOFT TISSUES: Diffuse osteopenia. Multilevel degenerative disc disease of the spine. IMPRESSION: 1. Emphysema. No pneumonia or pulmonary edema. 2. Fullness of the right hilar region measuring 2.5 cm.while this may be artifactual due to patient rotation, lymphadenopathy or hilar mass could also have this appearance. Nonemergent chest CT with iv contrast  recommended for further characterization. Electronically signed by: Rogelia Myers MD MD 11/29/2024 03:42 PM EST RP Workstation: [id number]    Microbiology: Results for orders placed or performed during the hospital encounter of 10/10/24  Resp panel by RT-PCR (RSV, Flu A&B, Covid) Anterior Nasal Swab     Status: None   Collection Time: 10/17/24  5:05 PM   Specimen: Anterior Nasal Swab  Result Value Ref Range Status   SARS Coronavirus 2 by RT PCR NEGATIVE NEGATIVE Final    Comment: (NOTE) SARS-CoV-2 target nucleic acids are NOT DETECTED.  The SARS-CoV-2 RNA is generally detectable in upper respiratory specimens during the acute phase of infection. The lowest concentration of SARS-CoV-2 viral copies this assay can detect is 138 copies/mL. A negative result does not preclude SARS-Cov-2 infection and should not be used as the sole basis for treatment or other patient management decisions. A negative result may occur with  improper specimen collection/handling, submission of specimen other than nasopharyngeal swab, presence of viral mutation(s) within the areas targeted by this assay, and inadequate number of viral copies(<138  copies/mL). A negative result must be combined with clinical observations, patient history, and epidemiological information. The expected result is Negative.  Fact Sheet for Patients:  bloggercourse.com  Fact Sheet for Healthcare Providers:  seriousbroker.it  This test is no t yet approved or cleared by the [place]  FDA and  has been authorized for detection and/or diagnosis of SARS-CoV-2 by FDA under an Emergency Use Authorization (EUA). This EUA will remain  in effect (meaning this test can be used) for the duration of the COVID-19 declaration under Section 564(b)(1) of the Act, 21 U.S.C.section 360bbb-3(b)(1), unless the authorization is terminated  or revoked sooner.       Influenza A by PCR NEGATIVE  NEGATIVE Final   Influenza B by PCR NEGATIVE NEGATIVE Final    Comment: (NOTE) The Xpert Xpress SARS-CoV-2/FLU/RSV plus assay is intended as an aid in the diagnosis of influenza from Nasopharyngeal swab specimens and should not be used as a sole basis for treatment. Nasal washings and aspirates are unacceptable for Xpert Xpress SARS-CoV-2/FLU/RSV testing.  Fact Sheet for Patients: bloggercourse.com  Fact Sheet for Healthcare Providers: seriousbroker.it  This test is not yet approved or cleared by the [place]  FDA and has been authorized for detection and/or diagnosis of SARS-CoV-2 by FDA under an Emergency Use Authorization (EUA). This EUA will remain in effect (meaning this test can be used) for the duration of the COVID-19 declaration under Section 564(b)(1) of the Act, 21 U.S.C. section 360bbb-3(b)(1), unless the authorization is terminated or revoked.     Resp Syncytial Virus by PCR NEGATIVE NEGATIVE Final    Comment: (NOTE) Fact Sheet for Patients: bloggercourse.com  Fact Sheet for Healthcare Providers: seriousbroker.it  This test is not yet approved or cleared by the [place]  FDA and has been authorized for detection and/or diagnosis of SARS-CoV-2 by FDA under an Emergency Use Authorization (EUA). This EUA will remain in effect (meaning this test can be used) for the duration of the COVID-19 declaration under Section 564(b)(1) of the Act, 21 U.S.C. section 360bbb-3(b)(1), unless the authorization is terminated or revoked.  Performed at [facility], [address] [place], [place] [address]   Culture, blood (Routine X 2) w Reflex to ID Panel     Status: None   Collection Time: 10/17/24  5:06 PM   Specimen: BLOOD  Result Value Ref Range Status   Specimen Description BLOOD BLOOD LEFT HAND  Final   Special Requests   Final    BOTTLES DRAWN AEROBIC  AND ANAEROBIC Blood Culture adequate volume   Culture   Final    NO GROWTH 5 DAYS Performed at [facility], [address] [place], [place] [address]    Report Status 10/22/2024 FINAL  Final  Culture, blood (Routine X 2) w Reflex to ID Panel     Status: None   Collection Time: 10/17/24  5:06 PM   Specimen: BLOOD  Result Value Ref Range Status   Specimen Description BLOOD BLOOD RIGHT HAND  Final   Special Requests   Final    BOTTLES DRAWN AEROBIC AND ANAEROBIC Blood Culture adequate volume   Culture   Final    NO GROWTH 5 DAYS Performed at [facility], [address], [place], [place] [address]    Report Status 10/22/2024 FINAL  Final  Respiratory (~20 pathogens) panel by PCR     Status: None   Collection Time: 10/17/24 11:01 PM   Specimen: Nasopharyngeal Swab; Respiratory  Result Value Ref Range Status  Adenovirus NOT DETECTED NOT DETECTED Final   Coronavirus 229E NOT DETECTED NOT DETECTED Final    Comment: (NOTE) The Coronavirus on the Respiratory Panel, DOES NOT test for the novel  Coronavirus (2019 nCoV)    Coronavirus HKU1 NOT DETECTED NOT DETECTED Final   Coronavirus NL63 NOT DETECTED NOT DETECTED Final   Coronavirus OC43 NOT DETECTED NOT DETECTED Final   Metapneumovirus NOT DETECTED NOT DETECTED Final   Rhinovirus / Enterovirus NOT DETECTED NOT DETECTED Final   Influenza A NOT DETECTED NOT DETECTED Final   Influenza B NOT DETECTED NOT DETECTED Final   Parainfluenza Virus 1 NOT DETECTED NOT DETECTED Final   Parainfluenza Virus 2 NOT DETECTED NOT DETECTED Final   Parainfluenza Virus 3 NOT DETECTED NOT DETECTED Final   Parainfluenza Virus 4 NOT DETECTED NOT DETECTED Final   Respiratory Syncytial Virus NOT DETECTED NOT DETECTED Final   Bordetella pertussis NOT DETECTED NOT DETECTED Final   Bordetella Parapertussis NOT DETECTED NOT DETECTED Final   Chlamydophila pneumoniae NOT DETECTED NOT DETECTED Final   Mycoplasma pneumoniae NOT DETECTED NOT  DETECTED Final    Comment: Performed at [facility], [address] [place], [place] [address]    Labs: CBC: Recent Labs  Lab 11/29/24 1521 11/30/24 0015 11/30/24 0558 11/30/24 1730 12/01/24 0617 12/01/24 1816 12/02/24 0525  WBC 6.7  --  6.2  --   --   --   --   HGB 5.9* 6.2* 6.4* 8.7* 9.2* 10.4* 10.0*  HCT 20.4* 19.8* 20.0*  --   --   --   --   MCV 94.0  --  91.3  --   --   --   --   PLT 218  --  188  --   --   --   --    Basic Metabolic Panel: Recent Labs  Lab 11/29/24 1521 11/30/24 0558 12/01/24 0617  NA 143 140 143  K 4.4 3.9 4.2  CL 107 107 107  CO2 23 26 26   GLUCOSE 107* 88 107*  BUN 30* 25* 17  CREATININE 1.26* 1.08 1.03  CALCIUM  9.0 8.6* 8.9  MG  --   --  2.4   Liver Function Tests: No results for input(s): AST, ALT, ALKPHOS, BILITOT, PROT, ALBUMIN  in the last 168 hours. CBG: No results for input(s): GLUCAP in the last 168 hours.  Discharge time spent: 35 minutes.  Signed: Murvin Mana, MD [facility] 12/02/2024 "

## 2024-12-02 NOTE — Progress Notes (Signed)
 Patient off unit via transport to colonoscopy

## 2024-12-02 NOTE — Progress Notes (Signed)
 Pt awake started bowel prep again this morning at 3 am; pt having trouble drinking prep, RN educated on the importance of clear stool for the procedure, will continue to encourage, pt resume drinking.

## 2024-12-02 NOTE — TOC Transition Note (Signed)
 Transition of Care [facility]) - Discharge Note   Patient Details  Name: Ralph Leon MRN: [id number] Date of Birth: 1936-10-27  [facility] CM/SW Contact:  Sheily Lineman L Salvatore Poe, LCSW Phone Number: 12/02/2024, 1:20 PM   Clinical Narrative:     Patient medically stable for discharge. Patient has arranged his own transportation. [facility] notified of his discharge.   No further TOC needs.         Patient Goals and CMS Choice            Discharge Placement                       Discharge Plan and Services Additional resources added to the After Visit Summary for                                       Social Drivers of Health (SDOH) Interventions SDOH Screenings   Food Insecurity: No Food Insecurity (11/29/2024)  Housing: Unknown (11/29/2024)  Transportation Needs: No Transportation Needs (11/29/2024)  Utilities: Not At Risk (11/29/2024)  Alcohol Screen: Low Risk (04/19/2024)  Depression (PHQ2-9): Low Risk (10/02/2024)  Financial Resource Strain: Low Risk  (09/11/2024)   Received from [facility]  Physical Activity: Insufficiently Active (04/19/2024)  Social Connections: Socially Isolated (11/29/2024)  Stress: No Stress Concern Present (04/19/2024)  Tobacco Use: Medium Risk (11/29/2024)  Health Literacy: Adequate Health Literacy (04/19/2024)     Readmission Risk Interventions     No data to display

## 2024-12-02 NOTE — Progress Notes (Signed)
 Patient returned to unit via bed in stable condition

## 2024-12-02 NOTE — Evaluation (Signed)
 Physical Therapy Evaluation Patient Details Name: Ralph Leon MRN: [id number] DOB: Apr 30, 1936 Today's Date: 12/02/2024  History of Present Illness  Pt admitted to [facility] on 11/29/24 from PCP office for c/o hypoxia, hypotension, and tachycardia. Also with c/o intermittent rectal bleeding and DOE. Imaging negative for acute abnormality. Concern for lower GIB with chronic hematochezia. S/p endoscopy 1/10. Significant PMH includes: iron  deficiency anemia secondary to GI bleed, COPD, HTN, CKD stage IIIb, stroke.   Clinical Impression  Pt received in supine and is agreeable for PT eval. At baseline, pt is mod I for ADL's, IADL's, medication management, household ambulation without AD (UE support on walls/furniture for steadying), and limited community ambulation with RW. Endorses hx of falls.  Pt presents with decreased activity tolerance, decreased standing balance, and decreased upright tolerance, resulting in impaired functional mobility from baseline. Due to deficits, pt required mod I for bed mobility, supervision for safety with transfers, and supervision to ambulate short distance in room with RW. Further mobility deferred secondary to c/o mild-moderate lightheadedness in standing.   Deficits limit the pt's ability to safely and independently perform ADL's, transfer, and ambulate. Pt will benefit from acute skilled PT services to address deficits for return to baseline function. Pt will benefit from post acute therapy services to address deficits for return to baseline function.    Pt appropriate for IND transfers <> BSC at bedside. Encourage OOB mobility with nursing and mobility tech for meals and toileting for continued progress towards goals and maintenance of IND with functional mobility while hospitalized.       If plan is discharge home, recommend the following: A little help with bathing/dressing/bathroom;Assistance with cooking/housework;Assist for transportation   Can travel by private  vehicle    Yes    Equipment Recommendations None recommended by PT     Functional Status Assessment Patient has had a recent decline in their functional status and demonstrates the ability to make significant improvements in function in a reasonable and predictable amount of time.     Precautions / Restrictions Precautions Precautions: Fall Restrictions Other Position/Activity Restrictions: DNR-Limited      Mobility  Bed Mobility               General bed mobility comments: mod I for sup<>sit, HOB flat, use of BUE for support, increased time/effort    Transfers                   General transfer comment: supervision for safety to stand from EOB and recliner with RW, posterior LE bracing on bed/chair for stability. demo's good eccentric lowering with proper hand placement. verbal cues for safety, sequencing, and hand placement.    Ambulation/Gait   Gait Distance (Feet):  (84ft x2)           General Gait Details: supervision for safety to ambulate EOB<>recliner with seated rest break between. demo's normal cadence, narrow BOS, and decreased step length/foot clearance bil. further mobility deferred due to c/o mild-mod lightheadedness in standing.     Balance Overall balance assessment: Needs assistance   Sitting balance-Leahy Scale: Normal     Standing balance support: Bilateral upper extremity supported, During functional activity, Reliant on assistive device for balance Standing balance-Leahy Scale: Good                               Pertinent Vitals/Pain Pain Assessment Pain Assessment: No/denies pain    Home Living Family/patient expects to be  discharged to:: Private residence Living Arrangements: Non-relatives/Friends Available Help at Discharge: Available 24 hours/day Type of Home: House Home Access: Level entry (through the side entrance)       Home Layout: One level Home Equipment: Agricultural Consultant (2 wheels);Shower seat -  built in;Grab bars - tub/shower      Prior Function Prior Level of Function : History of Falls (last six months)             Mobility Comments: mod I for household ambulation using walls/furniture for steadying; uses RW for limited community ambulation. ADLs Comments: IND with ADL's, IADL's, and medication management. Does not drive.     Extremity/Trunk Assessment   Upper Extremity Assessment Upper Extremity Assessment: Overall WFL for tasks assessed    Lower Extremity Assessment Lower Extremity Assessment: Overall WFL for tasks assessed          Cognition Arousal: Alert Behavior During Therapy: WFL for tasks assessed/performed   PT - Cognitive impairments: No apparent impairments                         Following commands: Intact       Cueing       General Comments General comments (skin integrity, edema, etc.): initial moderate lightheadedness in standing that subsided to mild with time. likely due to lack of PO intake due to procedures today    Exercises Other Exercises Other Exercises: Pt edu re: PT role/POC, DC recs, safety with functional mobility, IND transfer to/from [facility], pacing, HH services.   Assessment/Plan    PT Assessment Patient needs continued PT services  PT Problem List Decreased activity tolerance;Decreased balance;Decreased mobility       PT Treatment Interventions DME instruction;Gait training;Functional mobility training;Therapeutic activities;Therapeutic exercise;Balance training;Neuromuscular re-education;Patient/family education    PT Goals (Current goals can be found in the Care Plan section)  Acute Rehab PT Goals Patient Stated Goal: go home today PT Goal Formulation: With patient Time For Goal Achievement: 12/16/24 Potential to Achieve Goals: Good    Frequency Min 2X/week        AM-PAC PT 6 Clicks Mobility  Outcome Measure Help needed turning from your back to your side while in a flat bed without using  bedrails?: None Help needed moving from lying on your back to sitting on the side of a flat bed without using bedrails?: None Help needed moving to and from a bed to a chair (including a wheelchair)?: A Little Help needed standing up from a chair using your arms (e.g., wheelchair or bedside chair)?: A Little Help needed to walk in hospital room?: A Little Help needed climbing 3-5 steps with a railing? : A Little 6 Click Score: 20    End of Session Equipment Utilized During Treatment: Gait belt Activity Tolerance: Patient tolerated treatment well Patient left: in bed;with call bell/phone within reach (bed alarm not warranted; pt safe for IND transfer <> BSC) Nurse Communication: Mobility status PT Visit Diagnosis: Unsteadiness on feet (R26.81)    Time: 8751-8684 PT Time Calculation (min) (ACUTE ONLY): 27 min   Charges:   PT Evaluation $PT Eval Moderate Complexity: 1 Mod PT Treatments $Therapeutic Activity: 8-22 mins PT General Charges $$ ACUTE PT VISIT: 1 Visit       Camie CHARLENA Kluver, PT, DPT 2:38 PM,12/02/2024 Physical Therapist - [facility]

## 2024-12-02 NOTE — Anesthesia Postprocedure Evaluation (Signed)
"   Anesthesia Post Note  Patient: Garmon Dehn  Procedure(s) Performed: COLONOSCOPY  Anesthesia Type: MAC Anesthetic complications: no   There were no known notable events for this encounter.   Last Vitals:  Vitals:   12/02/24 1205 12/02/24 1215  BP: (!) 115/52 (!) 125/55  Pulse:    Resp:    Temp:    SpO2:      Last Pain:  Vitals:   12/02/24 1215  TempSrc:   PainSc: 0-No pain                 Redell MARLA Breaker      "

## 2024-12-02 NOTE — Progress Notes (Signed)
 PT orders received and pt chart reviewed. Per chart, pt off floor for endoscopy. Will continue efforts as appropriate and with time permitting.   Camie CHARLENA Kluver, PT, DPT 11:45 AM,12/02/2024 Physical Therapist - [place] [facility]

## 2024-12-02 NOTE — Op Note (Signed)
 [facility] Gastroenterology Patient Name: Ralph Leon Procedure Date: 12/02/2024 10:07 AM MRN: [id number] Account #: [id number] Date of Birth: [birth year] Admit Type: Inpatient Age: 89 Room: [facility] ENDO ROOM 4 Gender: Male Note Status: Finalized Instrument Name: Colon Scope [id number] Procedure:             Colonoscopy Indications:           Hematochezia, Acute post hemorrhagic anemia Providers:             Lataisha Colan K. Aundria MD, MD Referring MD:          Darice Petty (Referring MD) Medicines:             Propofol  per Anesthesia Complications:         No immediate complications. Estimated blood loss:                         Minimal. Procedure:             Pre-Anesthesia Assessment:                        - The risks and benefits of the procedure and the                         sedation options and risks were discussed with the                         patient. All questions were answered and informed                         consent was obtained.                        - Patient identification and proposed procedure were                         verified prior to the procedure by the nurse. The                         procedure was verified in the procedure room.                        - ASA Grade Assessment: III - A patient with severe                         systemic disease.                        - After reviewing the risks and benefits, the patient                         was deemed in satisfactory condition to undergo the                         procedure.                        After obtaining informed consent, the colonoscope was                         passed under direct vision.  Throughout the procedure,                         the patient's blood pressure, pulse, and oxygen                         saturations were monitored continuously. The                         Colonoscope was introduced through the anus and                         advanced to the  the cecum, identified by appendiceal                         orifice and ileocecal valve. The colonoscopy was                         somewhat difficult due to dolichocolon and multiple                         diverticula in the colon. Successful completion of the                         procedure was aided by withdrawing and reinserting the                         scope. The patient tolerated the procedure well. The                         quality of the bowel preparation was good. The                         ileocecal valve, appendiceal orifice, and rectum were                         photographed. Findings:      The perianal and digital rectal examinations were normal. Pertinent       negatives include normal sphincter tone and no palpable rectal lesions.      Multiple large-mouthed and small-mouthed diverticula were found in the       sigmoid colon. There was no evidence of diverticular bleeding. Estimated       blood loss: none.      A single (solitary) twenty mm ulcer was found in the sigmoid colon. No       bleeding was present. No stigmata of recent bleeding were seen. Biopsies       were taken with a cold forceps for histology. Estimated blood loss was       minimal.      A 10 mm polyp was found in the ascending colon. The polyp was sessile.       The polyp was removed with a hot snare. Resection and retrieval were       complete. Estimated blood loss: none. To prevent bleeding after the       polypectomy, one hemostatic clip was successfully placed (MR       conditional). Clip manufacturer: [employer]. There was no       bleeding during, or at the end, of the procedure. Estimated  blood loss:       none.      Many small-mouthed diverticula were found in the descending colon,       transverse colon and ascending colon. There was no evidence of       diverticular bleeding.      The exam was otherwise without abnormality on direct and retroflexion       views. Impression:             - Severe diverticulosis in the sigmoid colon. There                         was no evidence of diverticular bleeding.                        - A single (solitary) ulcer in the sigmoid colon.                         Biopsied.                        - One 10 mm polyp in the ascending colon, removed with                         a hot snare. Resected and retrieved. Clip (MR                         conditional) was placed. Clip manufacturer: [employer].                        - Mild diverticulosis in the descending colon, in the                         transverse colon and in the ascending colon. There was                         no evidence of diverticular bleeding.                        - The examination was otherwise normal on direct and                         retroflexion views. Recommendation:        - Patient has a contact number available for                         emergencies. The signs and symptoms of potential                         delayed complications were discussed with the patient.                         Return to normal activities tomorrow. Written                         discharge instructions were provided to the patient.                        -  Advance diet as tolerated.                        - Continue present medications.                        - No aspirin , ibuprofen, naproxen, or other                         non-steroidal anti-inflammatory drugs for 2 weeks                         after polyp removal.                        - Await pathology results. Procedure Code(s):     --- Professional ---                        [phone], Colonoscopy, flexible; with removal of                         tumor(s), polyp(s), or other lesion(s) by snare                         technique                        45380, 59, Colonoscopy, flexible; with biopsy, single                         or multiple Diagnosis Code(s):     --- Professional ---                         K57.30, Diverticulosis of large intestine without                         perforation or abscess without bleeding                        D62, Acute posthemorrhagic anemia                        K92.1, Melena (includes Hematochezia)                        D12.2, Benign neoplasm of ascending colon                        K63.3, Ulcer of intestine CPT copyright 2022 [employer]. All rights reserved. The codes documented in this report are preliminary and upon coder review may  be revised to meet current compliance requirements. Ladell MARLA Boss MD, MD 12/02/2024 11:54:46 AM This report has been signed electronically. Number of Addenda: 0 Note Initiated On: 12/02/2024 10:07 AM Scope Withdrawal Time: 0 hours 9 minutes 3 seconds  Total Procedure Duration: 0 hours 20 minutes 31 seconds  Estimated Blood Loss:  Estimated blood loss was minimal. Estimated blood loss                         was minimal.      [facility]

## 2024-12-02 NOTE — Plan of Care (Signed)
" °  Problem: Clinical Measurements: Goal: Ability to maintain clinical measurements within normal limits will improve Outcome: Progressing   Problem: Coping: Goal: Level of anxiety will decrease Outcome: Progressing   Problem: Elimination: Goal: Will not experience complications related to bowel motility Outcome: Progressing   Problem: Pain Managment: Goal: General experience of comfort will improve and/or be controlled Outcome: Progressing   Problem: Safety: Goal: Ability to remain free from injury will improve Outcome: Progressing   Problem: Skin Integrity: Goal: Risk for impaired skin integrity will decrease Outcome: Progressing   "

## 2024-12-02 NOTE — Interval H&P Note (Signed)
 History and Physical Interval Note:  12/02/2024 11:20 AM  Ralph Leon  has presented today for surgery, with the diagnosis of gi bleed.  The various methods of treatment have been discussed with the patient and family. After consideration of risks, benefits and other options for treatment, the patient has consented to  Procedures: COLONOSCOPY (N/A) as a surgical intervention.  The patient's history has been reviewed, patient examined, no change in status, stable for surgery.  I have reviewed the patient's chart and labs.  Questions were answered to the patient's satisfaction.     [place], Terrilyn Tyner

## 2024-12-02 NOTE — Transfer of Care (Signed)
 Immediate Anesthesia Transfer of Care Note  Patient: Ralph Leon  Procedure(s) Performed: COLONOSCOPY  Patient Location: PACU  Anesthesia Type:General  Level of Consciousness: drowsy and patient cooperative  Airway & Oxygen Therapy: Patient Spontanous Breathing and Patient connected to nasal cannula oxygen  Post-op Assessment: Report given to RN and Post -op Vital signs reviewed and stable  Post vital signs: stable  Last Vitals:  Vitals Value Taken Time  BP    Temp    Pulse    Resp    SpO2      Last Pain:  Vitals:   12/02/24 1029  TempSrc: Temporal  PainSc:          Complications: No notable events documented.

## 2024-12-03 ENCOUNTER — Other Ambulatory Visit: Payer: Self-pay

## 2024-12-04 ENCOUNTER — Telehealth: Payer: Self-pay | Admitting: Oncology

## 2024-12-04 ENCOUNTER — Inpatient Hospital Stay

## 2024-12-04 ENCOUNTER — Inpatient Hospital Stay: Admitting: Oncology

## 2024-12-04 ENCOUNTER — Encounter: Payer: Self-pay | Admitting: Internal Medicine

## 2024-12-04 NOTE — Telephone Encounter (Signed)
 Pt called to r/s missed appts from when he was in the hospital (1/9 lab and 1/12 md/iron ) appts r/s to this Wednesday and Thursday. Dates and times confirmed with pt

## 2024-12-05 DIAGNOSIS — J9611 Chronic respiratory failure with hypoxia: Secondary | ICD-10-CM | POA: Diagnosis not present

## 2024-12-05 DIAGNOSIS — N1832 Chronic kidney disease, stage 3b: Secondary | ICD-10-CM | POA: Diagnosis not present

## 2024-12-05 DIAGNOSIS — K922 Gastrointestinal hemorrhage, unspecified: Secondary | ICD-10-CM | POA: Diagnosis not present

## 2024-12-05 DIAGNOSIS — I129 Hypertensive chronic kidney disease with stage 1 through stage 4 chronic kidney disease, or unspecified chronic kidney disease: Secondary | ICD-10-CM | POA: Diagnosis not present

## 2024-12-05 DIAGNOSIS — J440 Chronic obstructive pulmonary disease with acute lower respiratory infection: Secondary | ICD-10-CM | POA: Diagnosis not present

## 2024-12-05 DIAGNOSIS — Z87891 Personal history of nicotine dependence: Secondary | ICD-10-CM | POA: Diagnosis not present

## 2024-12-05 DIAGNOSIS — R32 Unspecified urinary incontinence: Secondary | ICD-10-CM | POA: Diagnosis not present

## 2024-12-05 DIAGNOSIS — K219 Gastro-esophageal reflux disease without esophagitis: Secondary | ICD-10-CM | POA: Diagnosis not present

## 2024-12-05 DIAGNOSIS — Z8673 Personal history of transient ischemic attack (TIA), and cerebral infarction without residual deficits: Secondary | ICD-10-CM | POA: Diagnosis not present

## 2024-12-05 DIAGNOSIS — J189 Pneumonia, unspecified organism: Secondary | ICD-10-CM | POA: Diagnosis not present

## 2024-12-05 DIAGNOSIS — D509 Iron deficiency anemia, unspecified: Secondary | ICD-10-CM | POA: Diagnosis not present

## 2024-12-05 DIAGNOSIS — F0390 Unspecified dementia without behavioral disturbance: Secondary | ICD-10-CM | POA: Diagnosis not present

## 2024-12-05 LAB — SURGICAL PATHOLOGY

## 2024-12-06 ENCOUNTER — Inpatient Hospital Stay: Attending: Oncology

## 2024-12-06 DIAGNOSIS — I700 Atherosclerosis of aorta: Secondary | ICD-10-CM | POA: Diagnosis not present

## 2024-12-06 DIAGNOSIS — D500 Iron deficiency anemia secondary to blood loss (chronic): Secondary | ICD-10-CM

## 2024-12-06 DIAGNOSIS — D649 Anemia, unspecified: Secondary | ICD-10-CM

## 2024-12-06 DIAGNOSIS — K5730 Diverticulosis of large intestine without perforation or abscess without bleeding: Secondary | ICD-10-CM | POA: Diagnosis not present

## 2024-12-06 DIAGNOSIS — Z8673 Personal history of transient ischemic attack (TIA), and cerebral infarction without residual deficits: Secondary | ICD-10-CM | POA: Diagnosis not present

## 2024-12-06 DIAGNOSIS — N281 Cyst of kidney, acquired: Secondary | ICD-10-CM | POA: Insufficient documentation

## 2024-12-06 DIAGNOSIS — Z833 Family history of diabetes mellitus: Secondary | ICD-10-CM | POA: Diagnosis not present

## 2024-12-06 DIAGNOSIS — Z7982 Long term (current) use of aspirin: Secondary | ICD-10-CM | POA: Diagnosis not present

## 2024-12-06 DIAGNOSIS — N1831 Chronic kidney disease, stage 3a: Secondary | ICD-10-CM | POA: Insufficient documentation

## 2024-12-06 DIAGNOSIS — Z87891 Personal history of nicotine dependence: Secondary | ICD-10-CM | POA: Diagnosis not present

## 2024-12-06 DIAGNOSIS — D509 Iron deficiency anemia, unspecified: Secondary | ICD-10-CM | POA: Diagnosis present

## 2024-12-06 DIAGNOSIS — Z8249 Family history of ischemic heart disease and other diseases of the circulatory system: Secondary | ICD-10-CM | POA: Diagnosis not present

## 2024-12-06 DIAGNOSIS — Z604 Social exclusion and rejection: Secondary | ICD-10-CM | POA: Diagnosis not present

## 2024-12-06 DIAGNOSIS — K7689 Other specified diseases of liver: Secondary | ICD-10-CM | POA: Insufficient documentation

## 2024-12-06 DIAGNOSIS — R0902 Hypoxemia: Secondary | ICD-10-CM | POA: Diagnosis not present

## 2024-12-06 DIAGNOSIS — N3289 Other specified disorders of bladder: Secondary | ICD-10-CM | POA: Insufficient documentation

## 2024-12-06 DIAGNOSIS — J439 Emphysema, unspecified: Secondary | ICD-10-CM | POA: Insufficient documentation

## 2024-12-06 DIAGNOSIS — I7143 Infrarenal abdominal aortic aneurysm, without rupture: Secondary | ICD-10-CM | POA: Insufficient documentation

## 2024-12-06 DIAGNOSIS — K8020 Calculus of gallbladder without cholecystitis without obstruction: Secondary | ICD-10-CM | POA: Insufficient documentation

## 2024-12-06 DIAGNOSIS — Z860101 Personal history of adenomatous and serrated colon polyps: Secondary | ICD-10-CM | POA: Diagnosis not present

## 2024-12-06 DIAGNOSIS — M4316 Spondylolisthesis, lumbar region: Secondary | ICD-10-CM | POA: Diagnosis not present

## 2024-12-06 DIAGNOSIS — K633 Ulcer of intestine: Secondary | ICD-10-CM | POA: Diagnosis not present

## 2024-12-06 DIAGNOSIS — K529 Noninfective gastroenteritis and colitis, unspecified: Secondary | ICD-10-CM | POA: Diagnosis not present

## 2024-12-06 DIAGNOSIS — Z79899 Other long term (current) drug therapy: Secondary | ICD-10-CM | POA: Diagnosis not present

## 2024-12-06 DIAGNOSIS — R195 Other fecal abnormalities: Secondary | ICD-10-CM

## 2024-12-06 DIAGNOSIS — K5900 Constipation, unspecified: Secondary | ICD-10-CM | POA: Insufficient documentation

## 2024-12-06 DIAGNOSIS — N400 Enlarged prostate without lower urinary tract symptoms: Secondary | ICD-10-CM | POA: Insufficient documentation

## 2024-12-06 LAB — CBC (CANCER CENTER ONLY)
HCT: 31.3 % — ABNORMAL LOW (ref 39.0–52.0)
Hemoglobin: 9.7 g/dL — ABNORMAL LOW (ref 13.0–17.0)
MCH: 29.3 pg (ref 26.0–34.0)
MCHC: 31 g/dL (ref 30.0–36.0)
MCV: 94.6 fL (ref 80.0–100.0)
Platelet Count: 243 K/uL (ref 150–400)
RBC: 3.31 MIL/uL — ABNORMAL LOW (ref 4.22–5.81)
RDW: 19.1 % — ABNORMAL HIGH (ref 11.5–15.5)
WBC Count: 6.3 K/uL (ref 4.0–10.5)
nRBC: 0 % (ref 0.0–0.2)

## 2024-12-06 LAB — IRON AND TIBC
Iron: 40 ug/dL — ABNORMAL LOW (ref 45–182)
Saturation Ratios: 15 % — ABNORMAL LOW (ref 17.9–39.5)
TIBC: 269 ug/dL (ref 250–450)
UIBC: 229 ug/dL

## 2024-12-06 LAB — FERRITIN: Ferritin: 221 ng/mL (ref 24–336)

## 2024-12-06 LAB — SAMPLE TO BLOOD BANK

## 2024-12-07 ENCOUNTER — Inpatient Hospital Stay

## 2024-12-07 ENCOUNTER — Encounter: Payer: Self-pay | Admitting: Oncology

## 2024-12-07 ENCOUNTER — Inpatient Hospital Stay: Admitting: Oncology

## 2024-12-07 VITALS — BP 143/63 | HR 76 | Temp 96.4°F | Resp 18 | Wt 127.6 lb

## 2024-12-07 VITALS — BP 141/60 | HR 72 | Resp 18

## 2024-12-07 DIAGNOSIS — D500 Iron deficiency anemia secondary to blood loss (chronic): Secondary | ICD-10-CM

## 2024-12-07 DIAGNOSIS — D509 Iron deficiency anemia, unspecified: Secondary | ICD-10-CM | POA: Diagnosis not present

## 2024-12-07 DIAGNOSIS — Z8673 Personal history of transient ischemic attack (TIA), and cerebral infarction without residual deficits: Secondary | ICD-10-CM | POA: Diagnosis not present

## 2024-12-07 DIAGNOSIS — N1831 Chronic kidney disease, stage 3a: Secondary | ICD-10-CM

## 2024-12-07 MED ORDER — IRON SUCROSE 20 MG/ML IV SOLN
200.0000 mg | Freq: Once | INTRAVENOUS | Status: AC
  Administered 2024-12-07: 200 mg via INTRAVENOUS

## 2024-12-07 NOTE — Assessment & Plan Note (Signed)
 Patient is on aspirin  81 mg daily and a statin.

## 2024-12-07 NOTE — Assessment & Plan Note (Addendum)
 Labs are reviewed and discussed with patient. Lab Results  Component Value Date   HGB 9.7 (L) 12/06/2024   TIBC 269 12/06/2024   IRONPCTSAT 15 (L) 12/06/2024   FERRITIN 221 12/06/2024      Iron  panel has improved with borderline iron  saturation.  Hemoglobin has improved. Recommend 1 additional Venofer  200 mg to further improve iron  stores. Recommend patient to continue oral iron  supplementation.

## 2024-12-07 NOTE — Progress Notes (Signed)
 " Hematology/Oncology Consult note [phone] [phone]        REFERRING PROVIDER: [name], NP   CHIEF COMPLAINTS/REASON FOR VISIT:  Evaluation of iron  deficiency anemia   ASSESSMENT & PLAN:   Iron  deficiency anemia Labs are reviewed and discussed with patient. Lab Results  Component Value Date   HGB 9.7 (L) 12/06/2024   TIBC 269 12/06/2024   IRONPCTSAT 15 (L) 12/06/2024   FERRITIN 221 12/06/2024      Iron  panel has improved with borderline iron  saturation.  Hemoglobin has improved. Recommend 1 additional Venofer  200 mg to further improve iron  stores. Recommend patient to continue oral iron  supplementation.  History of CVA (cerebrovascular accident) Patient is on aspirin  81 mg daily and a statin.  CKD stage 3a, GFR 45-59 ml/min (HCC) Avoid nephrotoxins   Orders Placed This Encounter  Procedures   CBC with Differential (Cancer Center Only)    Standing Status:   Future    Expected Date:   03/07/2025    Expiration Date:   06/05/2025   Iron  and TIBC    Standing Status:   Future    Expected Date:   03/07/2025    Expiration Date:   06/05/2025   Ferritin    Standing Status:   Future    Expected Date:   03/07/2025    Expiration Date:   06/05/2025   Retic Panel    Standing Status:   Future    Expected Date:   03/07/2025    Expiration Date:   06/05/2025   Follow-up in 3 months. All questions were answered. The patient knows to call the clinic with any problems, questions or concerns.  [name], MD, PhD [facility] Hematology Oncology 12/07/2024   HISTORY OF PRESENTING ILLNESS:   Ralph Leon is a  89 y.o.  male with PMH listed below was seen in consultation at the request of  [name], NP  for evaluation of iron  deficiency anemia  Discussed the use of AI scribe software for clinical note transcription with the patient, who gave verbal consent to proceed.  He recently had a blood test on October 7th showing very low iron   saturation at 3% and a decreased hemoglobin level of 8.1. Ferritin level was within normal limits at 92.   He has been taking low-dose aspirin  for approximately three years following a stroke. He feels tired and experiences constipation, which he describes as 'sort of comes and goes' and 'probably a little more than usual.' He has not tried any iron  supplements yet.  No recent weight loss, but he notes a slight weight gain, stating he has 'gained a pound or so.' He reports having a good appetite. He has undergone a colonoscopy once in the past but is reluctant to have another one.  INTERVAL HISTORY Ralph Leon is a 89 y.o. male who has above history reviewed by me today presents for follow up visit for iron  deficiency anemia due to GI blood loss. 11/29/2024 - 12/02/2024 patient was hospitalized due to lower GI bleeding.  Hemoglobin was 5.9, status post PRBC transfusions during hospitalization.  She also received home iron  Venofer  treatments. Patient had colonoscopy performed on 12/02/2024 which revealed a sigmoid colon ulcer, biopsy showed acute nonspecific colitis with ulceration.  He also has had ascending colon polyp removed and pathology showed tubular adenoma.  No malignancy or high-grade dysplasia seen was found.  Today patient reports feeling better.  He denies any more dark stools or blood in the stool.  MEDICAL HISTORY:  Past Medical History:  Diagnosis Date   COPD (chronic obstructive pulmonary disease) (HCC)    Hypertension    Stroke [facility])     SURGICAL HISTORY: Past Surgical History:  Procedure Laterality Date   BIOPSY OF SKIN SUBCUTANEOUS TISSUE AND/OR MUCOUS MEMBRANE  12/02/2024   Procedure: BIOPSY, GI;  Surgeon: Aundria, Ladell POUR, MD;  Location: [facility] ENDOSCOPY;  Service: Gastroenterology;;   COLONOSCOPY N/A 12/02/2024   Procedure: COLONOSCOPY;  Surgeon: Toledo, Ladell POUR, MD;  Location: [facility] ENDOSCOPY;  Service: Gastroenterology;  Laterality: N/A;   ESOPHAGOGASTRODUODENOSCOPY  N/A 10/13/2024   Procedure: EGD (ESOPHAGOGASTRODUODENOSCOPY);  Surgeon: Jinny Carmine, MD;  Location: [facility] ENDOSCOPY;  Service: Endoscopy;  Laterality: N/A;   HEMOSTASIS CLIP PLACEMENT  12/02/2024   Procedure: CONTROL OF HEMORRHAGE, GI TRACT, ENDOSCOPIC, BY CLIPPING OR OVERSEWING;  Surgeon: Aundria, Ladell POUR, MD;  Location: [facility] ENDOSCOPY;  Service: Gastroenterology;;   POLYPECTOMY  12/02/2024   Procedure: POLYPECTOMY, INTESTINE;  Surgeon: Toledo, Ladell POUR, MD;  Location: [facility] ENDOSCOPY;  Service: Gastroenterology;;    SOCIAL HISTORY: Social History   Socioeconomic History   Marital status: Single    Spouse name: Not on file   Number of children: Not on file   Years of education: Not on file   Highest education level: Not on file  Occupational History   Occupation: retired  Tobacco Use   Smoking status: Former    Current packs/day: 0.25    Average packs/day: 0.3 packs/day for 64.0 years (16.0 ttl pk-yrs)    Types: Cigarettes   Smokeless tobacco: Never   Tobacco comments:    5-6 a day. 03/06/2021  Vaping Use   Vaping status: Never Used  Substance and Sexual Activity   Alcohol use: Not Currently   Drug use: Not Currently   Sexual activity: Not Currently  Other Topics Concern   Not on file  Social History Narrative   Not on file   Social Drivers of Health   Tobacco Use: Medium Risk (12/07/2024)   Patient History    Smoking Tobacco Use: Former    Smokeless Tobacco Use: Never    Passive Exposure: Not on file  Financial Resource Strain: Low Risk  (09/11/2024)   Received from [facility]   Overall Financial Resource Strain (CARDIA)    Difficulty of Paying Living Expenses: Not very hard  Food Insecurity: No Food Insecurity (11/29/2024)   Epic    Worried About Radiation Protection Practitioner of Food in the Last Year: Never true    Ran Out of Food in the Last Year: Never true  Transportation Needs: No Transportation Needs (11/29/2024)   Epic    Lack of Transportation (Medical): No     Lack of Transportation (Non-Medical): No  Physical Activity: Insufficiently Active (04/19/2024)   Exercise Vital Sign    Days of Exercise per Week: 2 days    Minutes of Exercise per Session: 10 min  Stress: No Stress Concern Present (04/19/2024)   [employer] of Occupational Health - Occupational Stress Questionnaire    Feeling of Stress : Not at all  Social Connections: Socially Isolated (11/29/2024)   Social Connection and Isolation Panel    Frequency of Communication with Friends and Family: Twice a week    Frequency of Social Gatherings with Friends and Family: Twice a week    Attends Religious Services: Never    Database Administrator or Organizations: No    Attends Banker Meetings: Never    Marital Status: Never married  Intimate Partner Violence:  Not At Risk (11/29/2024)   Epic    Fear of Current or Ex-Partner: No    Emotionally Abused: No    Physically Abused: No    Sexually Abused: No  Depression (PHQ2-9): Low Risk (12/07/2024)   Depression (PHQ2-9)    PHQ-2 Score: 0  Alcohol Screen: Low Risk (04/19/2024)   Alcohol Screen    Last Alcohol Screening Score (AUDIT): 0  Housing: Unknown (11/29/2024)   Epic    Unable to Pay for Housing in the Last Year: No    Number of Times Moved in the Last Year: 0    Homeless in the Last Year: Patient declined  Utilities: Not At Risk (11/29/2024)   Epic    Threatened with loss of utilities: No  Health Literacy: Adequate Health Literacy (04/19/2024)   B1300 Health Literacy    Frequency of need for help with medical instructions: Never    FAMILY HISTORY: Family History  Problem Relation Age of Onset   Diabetes Mother    Heart attack Father     ALLERGIES:  has no known allergies.  MEDICATIONS:  Current Outpatient Medications  Medication Sig Dispense Refill   albuterol  (VENTOLIN  HFA) 108 (90 Base) MCG/ACT inhaler INHALE 2 PUFFS BY MOUTH EVERY 6 HOURS AS NEEDED FOR WHEEZING FOR SHORTNESS OF BREATH 18 g 2   aspirin  EC  81 MG tablet Take 1 tablet (81 mg total) by mouth daily. Swallow whole. 90 tablet 3   atorvastatin  (LIPITOR) 40 MG tablet Take 1 tablet by mouth once daily 90 tablet 0   donepezil  (ARICEPT ) 5 MG tablet Take 5 mg by mouth at bedtime.     ferrous sulfate  (FEROSUL) 325 (65 FE) MG tablet Take 1 tablet (325 mg total) by mouth daily with breakfast. 60 tablet 0   lisinopril  (ZESTRIL ) 20 MG tablet Take 1 tablet by mouth once daily 90 tablet 1   magnesium  oxide (MAG-OX) 400 (240 Mg) MG tablet Take 400 mg by mouth daily.     pantoprazole  (PROTONIX ) 40 MG tablet Take 1 tablet (40 mg total) by mouth daily.     prochlorperazine  (COMPAZINE ) 5 MG tablet Take 1 tablet (5 mg total) by mouth every 6 (six) hours as needed for nausea or vomiting. 10 tablet 0   senna-docusate (SENOKOT-S) 8.6-50 MG tablet Take 1 tablet by mouth daily.     vitamin D3 (CHOLECALCIFEROL) 25 MCG tablet Take 1,000 Units by mouth daily.     No current facility-administered medications for this visit.    Review of Systems  Constitutional:  Positive for fatigue. Negative for appetite change, chills, fever and unexpected weight change.  HENT:   Negative for hearing loss and voice change.   Eyes:  Negative for eye problems and icterus.  Respiratory:  Negative for chest tightness, cough and shortness of breath.   Cardiovascular:  Negative for chest pain and leg swelling.  Gastrointestinal:  Negative for abdominal distention, abdominal pain, blood in stool, nausea and vomiting.  Endocrine: Negative for hot flashes.  Genitourinary:  Negative for difficulty urinating, dysuria and frequency.   Musculoskeletal:  Negative for arthralgias.  Skin:  Negative for itching and rash.  Neurological:  Negative for light-headedness and numbness.  Hematological:  Negative for adenopathy. Does not bruise/bleed easily.  Psychiatric/Behavioral:  Negative for confusion.    PHYSICAL EXAMINATION:  Vitals:   12/07/24 1333 12/07/24 1344  BP: (!) 143/72 (!)  143/63  Pulse: 76   Resp: 18   Temp: (!) 96.4 F (35.8 C)   SpO2:  96%    Filed Weights   12/07/24 1333  Weight: 127 lb 9.6 oz (57.9 kg)    Physical Exam Constitutional:      General: He is not in acute distress.    Comments: Patient walks with a walker  HENT:     Head: Normocephalic and atraumatic.  Eyes:     General: No scleral icterus. Cardiovascular:     Rate and Rhythm: Normal rate.  Pulmonary:     Effort: Pulmonary effort is normal. No respiratory distress.     Breath sounds: Normal breath sounds. No wheezing.  Abdominal:     General: There is no distension.  Musculoskeletal:        General: Normal range of motion.     Cervical back: Normal range of motion and neck supple.  Skin:    Coloration: Skin is not pale.     Findings: No erythema or rash.  Neurological:     Mental Status: He is alert and oriented to person, place, and time. Mental status is at baseline.  Psychiatric:        Mood and Affect: Mood normal.     LABORATORY DATA:  I have reviewed the data as listed    Latest Ref Rng & Units 12/06/2024    2:05 PM 12/02/2024    5:25 AM 12/01/2024    6:16 PM  CBC  WBC 4.0 - 10.5 K/uL 6.3     Hemoglobin 13.0 - 17.0 g/dL 9.7  89.9  89.5   Hematocrit 39.0 - 52.0 % 31.3     Platelets 150 - 400 K/uL 243         Latest Ref Rng & Units 12/01/2024    6:17 AM 11/30/2024    5:58 AM 11/29/2024    3:21 PM  CMP  Glucose 70 - 99 mg/dL 892  88  892   BUN 8 - 23 mg/dL 17  25  30    Creatinine 0.61 - 1.24 mg/dL 8.96  8.91  8.73   Sodium 135 - 145 mmol/L 143  140  143   Potassium 3.5 - 5.1 mmol/L 4.2  3.9  4.4   Chloride 98 - 111 mmol/L 107  107  107   CO2 22 - 32 mmol/L 26  26  23    Calcium  8.9 - 10.3 mg/dL 8.9  8.6  9.0       RADIOGRAPHIC STUDIES: I have personally reviewed the radiological images as listed and agreed with the findings in the report. DG Chest 2 View Result Date: 11/29/2024 EXAM: 2 VIEW(S) XRAY OF THE CHEST 11/29/2024 03:38:00 PM COMPARISON:  10/17/2024 CLINICAL HISTORY: sob FINDINGS: LUNGS AND PLEURA: Emphysema. Fullness of the right hilar region measuring 2.5 cm. No pleural effusion. No pneumothorax. HEART AND MEDIASTINUM: Tortuous aorta with aortic atherosclerosis. BONES AND SOFT TISSUES: Diffuse osteopenia. Multilevel degenerative disc disease of the spine. IMPRESSION: 1. Emphysema. No pneumonia or pulmonary edema. 2. Fullness of the right hilar region measuring 2.5 cm.while this may be artifactual due to patient rotation, lymphadenopathy or hilar mass could also have this appearance. Nonemergent chest CT with iv contrast recommended for further characterization. Electronically signed by: Rogelia Myers MD MD 11/29/2024 03:42 PM EST RP Workstation: [id number]   DG Chest Port 1 View Result Date: 10/17/2024 EXAM: 1 VIEW XRAY OF THE CHEST 10/17/2024 05:14:00 PM COMPARISON: Portable chest 10/10/2024. CLINICAL HISTORY: Fever, unknown origin. FINDINGS: LUNGS AND PLEURA: The lungs are emphysematous with bi-apical scarring change. There are small new pleural effusions. Hazy and patchy  opacity in the base of the lungs could indicate pneumonic infiltrates, atelectasis, or a combination. The remaining lungs are clear. No pneumothorax. HEART AND MEDIASTINUM: The cardiac size is normal. Mediastinum is stable with aortic tortuosity and atherosclerosis. BONES AND SOFT TISSUES: No acute osseous abnormality. IMPRESSION: 1. Hazy and patchy basilar opacities, suspicious for pneumonic infiltrates versus atelectasis or a combination. 2. Small new pleural effusions. Electronically signed by: Francis Quam MD 10/17/2024 08:13 PM EST RP Workstation: [id number]   NM Pulmonary Perfusion Result Date: 10/12/2024 CLINICAL DATA:  Hypoxia. Elevated D-dimer levels. Patient reports falling 2-3 days ago. EXAM: NUCLEAR MEDICINE PERFUSION LUNG SCAN TECHNIQUE: Perfusion images were obtained in multiple projections after intravenous injection of radiopharmaceutical. No  ventilation imaging performed. RADIOPHARMACEUTICALS:  4.02 mCi Tc-34m MAA IV COMPARISON:  Chest radiographs 10/10/2024.  Chest CT 03/08/2020. FINDINGS: Patchy pulmonary perfusion with multiple small perfusion defects bilaterally, primarily at the lung apices. This is likely secondary to chronic obstructive pulmonary disease in this patient with severe emphysema on prior CT. There are no which shaped perfusion defects to strongly suggest pulmonary embolism. IMPRESSION: 1. Multiple small perfusion defects in both lungs, likely secondary to chronic obstructive pulmonary disease. 2. No wedge-shaped perfusion defects to strongly suggest acute pulmonary embolism. Perfusion only imaging is limited in this context. If strong clinical suspicion of thromboembolic disease, consider further evaluation with chest CTA or lower extremity venous Doppler ultrasound. Electronically Signed   By: Elsie Perone M.D.   On: 10/12/2024 16:11   DG Chest 1 View Result Date: 10/10/2024 CLINICAL DATA:  Possible aspiration. EXAM: CHEST  1 VIEW COMPARISON:  Chest CT 03/08/2020 FINDINGS: The heart size and mediastinal contours are within normal limits. There is likely linear external artifact overlying the right upper chest. There is no focal lung infiltrate, pleural effusion or pneumothorax. The visualized skeletal structures are unremarkable. IMPRESSION: No active disease. Electronically Signed   By: Greig Pique M.D.   On: 10/10/2024 22:16   CT ABDOMEN PELVIS W CONTRAST Result Date: 09/14/2024 CLINICAL DATA:  Weight loss, unintended Weight loss, positive fecal occult and IDA EXAM: CT ABDOMEN AND PELVIS WITH CONTRAST TECHNIQUE: Multidetector CT imaging of the abdomen and pelvis was performed using the standard protocol following bolus administration of intravenous contrast. RADIATION DOSE REDUCTION: This exam was performed according to the departmental dose-optimization program which includes automated exposure control, adjustment of  the mA and/or kV according to patient size and/or use of iterative reconstruction technique. CONTRAST:  OMNIPAQUE  IOHEXOL  300 MG/ML  SOLN COMPARISON:  None Available. FINDINGS: Lower chest: No acute pleural or parenchymal lung disease. Background emphysema. Hepatobiliary: Cholelithiasis without evidence of acute cholecystitis. Simple left lobe liver cyst. Otherwise the liver is unremarkable. No biliary duct dilation or choledocholithiasis. Pancreas: Unremarkable. No pancreatic ductal dilatation or surrounding inflammatory changes. Spleen: Normal in size without focal abnormality. Adrenals/Urinary Tract: Simple appearing left renal cortical cyst is not require specific follow-up. Otherwise the kidneys are unremarkable. The adrenals are unremarkable. Trabeculated bladder wall may reflect sequela of chronic bladder outlet obstruction given enlarged prostate. Stomach/Bowel: No bowel obstruction or ileus. Normal appendix right lower quadrant. Diffuse colonic diverticulosis most pronounced in the sigmoid colon. No evidence of acute diverticulitis. No bowel wall thickening or inflammatory change. Vascular/Lymphatic: There is a 3.3 cm infrarenal abdominal aortic aneurysm. No evidence of dissection. Diffuse atherosclerosis of the aorta and its branches. No pathologic adenopathy. Reproductive: The prostate is enlarged, measuring 4.8 x 4.0 x 4.5 cm. Other: No free fluid or free intraperitoneal gas. No abdominal  wall hernia. Musculoskeletal: No acute or destructive bony abnormalities. Grade 1 degenerative anterolisthesis of L4 on L5. Reconstructed images demonstrate no additional findings. IMPRESSION: 1. Diffuse colonic diverticulosis without diverticulitis. 2. Cholelithiasis without cholecystitis. 3. Enlarged prostate, with bladder wall trabeculations compatible with chronic bladder outlet obstruction. 4. Abdominal aortic aneurysm measuring 3.3 cm. Recommend surveillance ultrasound in 3 years. Reference: Journal of  Vascular Surgery 67.1 (2018): 2-77. J Am Coll Radiol 2013;10:789-794. 5.  Aortic Atherosclerosis (ICD10-I70.0). Electronically Signed   By: Ozell Daring M.D.   On: 09/14/2024 20:20         "

## 2024-12-07 NOTE — Assessment & Plan Note (Signed)
 Avoid nephrotoxins

## 2024-12-14 ENCOUNTER — Telehealth: Payer: Self-pay

## 2024-12-14 ENCOUNTER — Ambulatory Visit: Admitting: Nurse Practitioner

## 2024-12-14 ENCOUNTER — Encounter: Payer: Self-pay | Admitting: Nurse Practitioner

## 2024-12-14 VITALS — BP 137/70 | HR 72 | Temp 97.7°F | Ht 64.02 in | Wt 126.0 lb

## 2024-12-14 DIAGNOSIS — Z09 Encounter for follow-up examination after completed treatment for conditions other than malignant neoplasm: Secondary | ICD-10-CM | POA: Diagnosis not present

## 2024-12-14 DIAGNOSIS — D62 Acute posthemorrhagic anemia: Secondary | ICD-10-CM | POA: Diagnosis not present

## 2024-12-14 DIAGNOSIS — J449 Chronic obstructive pulmonary disease, unspecified: Secondary | ICD-10-CM | POA: Diagnosis not present

## 2024-12-14 LAB — CBC
Hematocrit: 32.4 % — ABNORMAL LOW (ref 37.5–51.0)
Hemoglobin: 10.1 g/dL — ABNORMAL LOW (ref 13.0–17.7)
MCH: 29.1 pg (ref 26.6–33.0)
MCHC: 31.2 g/dL — ABNORMAL LOW (ref 31.5–35.7)
MCV: 93 fL (ref 79–97)
Platelets: 214 x10E3/uL (ref 150–450)
RBC: 3.47 x10E6/uL — ABNORMAL LOW (ref 4.14–5.80)
RDW: 18.6 % — ABNORMAL HIGH (ref 11.6–15.4)
WBC: 5.9 x10E3/uL (ref 3.4–10.8)

## 2024-12-14 MED ORDER — PANTOPRAZOLE SODIUM 40 MG PO TBEC: 40.0000 mg | DELAYED_RELEASE_TABLET | Freq: Every day | ORAL | 1 refills | Status: AC

## 2024-12-14 MED ORDER — DONEPEZIL HCL 5 MG PO TABS: 5.0000 mg | ORAL_TABLET | Freq: Every day | ORAL | 1 refills | Status: AC

## 2024-12-14 NOTE — Telephone Encounter (Signed)
 Okay for verbal order

## 2024-12-14 NOTE — Assessment & Plan Note (Signed)
 Chronic.  Oxygen ranging from 80-98%  Hemoglobin in office today was 10.4%.  Will refer to Pulmonology for further evaluation of symptoms.

## 2024-12-14 NOTE — Telephone Encounter (Signed)
 I called Ralph Leon in [place] Callback Number: [phone] and informed her the provider gave her okay for the verbal order

## 2024-12-14 NOTE — Telephone Encounter (Signed)
 Copied from CRM [id number]. Topic: Clinical - Home Health Verbal Orders >> Dec 14, 2024 10:15 AM Tiffini S wrote: Caller/Agency: J[facility] in [place] Callback Number: [phone] Service Requested: Occupational Therapy Frequency:  Once a week for 6 week Any new concerns about the patient? No

## 2024-12-14 NOTE — Assessment & Plan Note (Addendum)
 Improved after hospitalization.  Received PRBC during hospitalization.  Colonoscopy revealed a bleeding ulcer.  Recommend continuing to follow up with GI and Heme.  Hemoglobin in office today was 10.4.  Follow up in 1 month.

## 2024-12-14 NOTE — Progress Notes (Signed)
 "  BP 137/70 (BP Location: Left Arm, Cuff Size: Normal)   Pulse 72   Temp 97.7 F (36.5 C) (Oral)   Ht 5' 4.02 (1.626 m)   Wt 126 lb (57.2 kg)   SpO2 98%   BMI 21.62 kg/m    Subjective:    Patient ID: Ralph Leon, male    DOB: 1936/07/06, 89 y.o.   MRN: [id number]  HPI: Ralph Leon is a 89 y.o. male  Chief Complaint  Patient presents with   Hospitalization Follow-up   Transition of Care Hospital Follow up.   Hospital/Facility: [facility] Physician: Dr. Laurita D/C Date: 12/02/24  Records Requested: NA Records Received: NA Records Reviewed: Yes  Diagnoses on Discharge:   In clinic today. Patient's Oxygen 81% seated at rest.  When he takes a deep breath it does increase above 94%.  He continues to struggle with fatigue.  Weight is steady at 126lbs.  He denies difficulty breathing and appears comfortable at rest.  He has been feeling pretty good.  This morning he started feeling more tired than usual. No SOB or blood in his stool.  Acute on chronic symptomatic anemia,  iron  deficiency anemia. Chronic hematochezia Patient received 1 unit of PRBC, hemoglobin only went up to 6.4, will give another unit of PRBC today.  Patient also has significant iron  deficiency, gave IV iron . B12 borderline, received a dose of B12 injection.  Homocystine level was normal.  No need for B12 chronically. Patient has a colonoscopy performed today, showed a sigmoid ulceration, no active bleeding. Hemoglobin has improved to 10.0.  Medically stable for discharge.   CKD stage IIIA, not 3B Renal function is better.   Acute respiratory failure with hypoxia, resolved COPD. Patient had a significant hypoxemia at time of admission, he also complained short of breath.  He received a 1 unit PRBC, symptom has resolved, he is off oxygen.  He does not seem to have COPD exacerbation.     Dementia - Continue Aricept    HTN Continue hold blood pressure medicine.   Hx of stroke - Continue aspirin  and  statin      Date of interactive Contact within 48 hours of discharge:  Contact was through: none  Date of 7 day or 14 day face-to-face visit:    within 14 days  Outpatient Encounter Medications as of 12/14/2024  Medication Sig   albuterol  (VENTOLIN  HFA) 108 (90 Base) MCG/ACT inhaler INHALE 2 PUFFS BY MOUTH EVERY 6 HOURS AS NEEDED FOR WHEEZING FOR SHORTNESS OF BREATH   aspirin  EC 81 MG tablet Take 1 tablet (81 mg total) by mouth daily. Swallow whole.   atorvastatin  (LIPITOR) 40 MG tablet Take 1 tablet by mouth once daily   donepezil  (ARICEPT ) 5 MG tablet Take 5 mg by mouth at bedtime.   ferrous sulfate  (FEROSUL) 325 (65 FE) MG tablet Take 1 tablet (325 mg total) by mouth daily with breakfast.   lisinopril  (ZESTRIL ) 20 MG tablet Take 1 tablet by mouth once daily   magnesium  oxide (MAG-OX) 400 (240 Mg) MG tablet Take 400 mg by mouth daily.   pantoprazole  (PROTONIX ) 40 MG tablet Take 1 tablet (40 mg total) by mouth daily.   prochlorperazine  (COMPAZINE ) 5 MG tablet Take 1 tablet (5 mg total) by mouth every 6 (six) hours as needed for nausea or vomiting.   senna-docusate (SENOKOT-S) 8.6-50 MG tablet Take 1 tablet by mouth daily.   vitamin D3 (CHOLECALCIFEROL) 25 MCG tablet Take 1,000 Units by mouth daily.   No facility-administered  encounter medications on file as of 12/14/2024.    Diagnostic Tests Reviewed/Disposition: Reviewed  Consults: GI  Discharge Instructions: Reviewed with patient and friend  Disease/illness Education: Reviewed during visit  Home Health/Community Services Discussions/Referrals: Home health  Establishment or re-establishment of referral orders for community resources: NA  Discussion with other health care providers: NA  Assessment and Support of treatment regimen adherence: Discussed with patient during visit  Appointments Coordinated with: Patient and friend  Education for self-management, independent living, and ADLs: Discussed during visit.  Relevant  past medical, surgical, family and social history reviewed and updated as indicated. Interim medical history since our last visit reviewed. Allergies and medications reviewed and updated.  Review of Systems  Constitutional:  Positive for fatigue.  Respiratory:  Negative for shortness of breath.   Gastrointestinal:  Negative for blood in stool.    Per HPI unless specifically indicated above     Objective:    BP 137/70 (BP Location: Left Arm, Cuff Size: Normal)   Pulse 72   Temp 97.7 F (36.5 C) (Oral)   Ht 5' 4.02 (1.626 m)   Wt 126 lb (57.2 kg)   SpO2 98%   BMI 21.62 kg/m   Wt Readings from Last 3 Encounters:  12/14/24 126 lb (57.2 kg)  12/07/24 127 lb 9.6 oz (57.9 kg)  11/29/24 123 lb 12.8 oz (56.2 kg)    Physical Exam Vitals and nursing note reviewed.  Constitutional:      General: He is not in acute distress.    Appearance: Normal appearance. He is not ill-appearing, toxic-appearing or diaphoretic.  HENT:     Head: Normocephalic.     Right Ear: External ear normal.     Left Ear: External ear normal.     Nose: Nose normal. No congestion or rhinorrhea.     Mouth/Throat:     Mouth: Mucous membranes are moist.  Eyes:     General:        Right eye: No discharge.        Left eye: No discharge.     Extraocular Movements: Extraocular movements intact.     Conjunctiva/sclera: Conjunctivae normal.     Pupils: Pupils are equal, round, and reactive to light.  Cardiovascular:     Rate and Rhythm: Normal rate and regular rhythm.     Heart sounds: No murmur heard. Pulmonary:     Effort: Pulmonary effort is normal. No respiratory distress.     Breath sounds: Normal breath sounds. No wheezing, rhonchi or rales.  Abdominal:     General: Abdomen is flat. Bowel sounds are normal.  Musculoskeletal:     Cervical back: Normal range of motion and neck supple.  Skin:    General: Skin is warm and dry.     Capillary Refill: Capillary refill takes less than 2 seconds.   Neurological:     General: No focal deficit present.     Mental Status: He is alert and oriented to person, place, and time.  Psychiatric:        Mood and Affect: Mood normal.        Behavior: Behavior normal.        Thought Content: Thought content normal.        Judgment: Judgment normal.     Results for orders placed or performed in visit on 12/06/24  CBC (Cancer Center Only)   Collection Time: 12/06/24  2:05 PM  Result Value Ref Range   WBC Count 6.3 4.0 - 10.5 K/uL  RBC 3.31 (L) 4.22 - 5.81 MIL/uL   Hemoglobin 9.7 (L) 13.0 - 17.0 g/dL   HCT 68.6 (L) 60.9 - 47.9 %   MCV 94.6 80.0 - 100.0 fL   MCH 29.3 26.0 - 34.0 pg   MCHC 31.0 30.0 - 36.0 g/dL   RDW 80.8 (H) 88.4 - 84.4 %   Platelet Count 243 150 - 400 K/uL   nRBC 0.0 0.0 - 0.2 %  Iron  and TIBC   Collection Time: 12/06/24  2:05 PM  Result Value Ref Range   Iron  40 (L) 45 - 182 ug/dL   TIBC 730 749 - 549 ug/dL   Saturation Ratios 15 (L) 17.9 - 39.5 %   UIBC 229 ug/dL  Ferritin   Collection Time: 12/06/24  2:05 PM  Result Value Ref Range   Ferritin 221 24 - 336 ng/mL  Hold Tube- Blood Bank   Collection Time: 12/06/24  2:05 PM  Result Value Ref Range   Blood Bank Specimen SAMPLE AVAILABLE FOR TESTING    Sample Expiration      12/09/2024,2359 Performed at [facility], [address], [place], [place] [address]       Assessment & Plan:   Problem List Items Addressed This Visit       Other   Acute blood loss anemia   Improved after hospitalization.  Received PRBC during hospitalization.  Colonoscopy revealed a bleeding ulcer.  Recommend continuing to follow up with GI and Heme.      Relevant Orders   CBC (STAT)   Other Visit Diagnoses       Hospital discharge follow-up    -  Primary   Overall symptoms improved. Continues to complain of fatigue. O2 back to baseline.  Repeat CMP and CBC done at visit today. Follow up with GI and Heme   Relevant Orders   Comp Met (CMET)   CBC (STAT)         Follow up plan: Return in about 1 month (around 01/14/2025) for HTN, HLD, DM2 FU.      "

## 2024-12-15 ENCOUNTER — Ambulatory Visit: Payer: Self-pay | Admitting: Nurse Practitioner

## 2024-12-15 LAB — COMPREHENSIVE METABOLIC PANEL WITH GFR
ALT: 18 IU/L (ref 0–44)
AST: 21 IU/L (ref 0–40)
Albumin: 3.9 g/dL (ref 3.7–4.7)
Alkaline Phosphatase: 80 IU/L (ref 48–129)
BUN/Creatinine Ratio: 17 (ref 10–24)
BUN: 20 mg/dL (ref 8–27)
Bilirubin Total: 0.5 mg/dL (ref 0.0–1.2)
CO2: 24 mmol/L (ref 20–29)
Calcium: 9 mg/dL (ref 8.6–10.2)
Chloride: 103 mmol/L (ref 96–106)
Creatinine, Ser: 1.17 mg/dL (ref 0.76–1.27)
Globulin, Total: 1.9 g/dL (ref 1.5–4.5)
Glucose: 130 mg/dL — ABNORMAL HIGH (ref 70–99)
Potassium: 4.2 mmol/L (ref 3.5–5.2)
Sodium: 142 mmol/L (ref 134–144)
Total Protein: 5.8 g/dL — ABNORMAL LOW (ref 6.0–8.5)
eGFR: 61 mL/min/1.73

## 2024-12-20 ENCOUNTER — Encounter: Payer: Self-pay | Admitting: Urology

## 2024-12-20 ENCOUNTER — Ambulatory Visit (INDEPENDENT_AMBULATORY_CARE_PROVIDER_SITE_OTHER): Admitting: Urology

## 2024-12-20 VITALS — BP 136/75 | HR 73 | Ht 64.0 in | Wt 127.0 lb

## 2024-12-20 DIAGNOSIS — N401 Enlarged prostate with lower urinary tract symptoms: Secondary | ICD-10-CM

## 2024-12-20 DIAGNOSIS — R3916 Straining to void: Secondary | ICD-10-CM

## 2024-12-20 DIAGNOSIS — R8281 Pyuria: Secondary | ICD-10-CM

## 2024-12-20 LAB — URINALYSIS, COMPLETE
Bilirubin, UA: NEGATIVE
Glucose, UA: NEGATIVE
Ketones, UA: NEGATIVE
Nitrite, UA: NEGATIVE
Specific Gravity, UA: 1.03 (ref 1.005–1.030)
Urobilinogen, Ur: 0.2 mg/dL (ref 0.2–1.0)
pH, UA: 5.5 (ref 5.0–7.5)

## 2024-12-20 LAB — MICROSCOPIC EXAMINATION: WBC, UA: >30 /HPF — AB (ref 0–5)

## 2024-12-20 LAB — BLADDER SCAN AMB NON-IMAGING

## 2024-12-20 MED ORDER — SILODOSIN 4 MG PO CAPS: 4.0000 mg | ORAL_CAPSULE | Freq: Every day | ORAL | 1 refills | Status: AC

## 2024-12-20 NOTE — Progress Notes (Signed)
 "  12/20/2024 4:26 PM   Ralph Leon 1935/12/11 [id number]  Referring provider: [name], NP [address] [place],  [place] [address]  Chief Complaint  Patient presents with   Other    HPI: Ralph Leon is a 89 y.o. male referred for evaluation of bladder outlet obstruction.    CT performed 09/12/2024 performed for weight loss remarkable for bladder wall thickening and prostate enlargement. Urinary symptoms include urinary hesitancy, weak urinary stream and nocturia x 3 IPSS today 18/35 No previous treatment for lower urinary tract symptoms Denies dysuria, gross hematuria Notes occasional dysuria and states he finished an antibiotic for a UTI ~2 weeks ago.  On record review he had positive urine cultures for Pseudomonas and E. coli in mid and late September 2025 Currently denies dysuria or significant change in his baseline voiding symptoms.   PMH: Past Medical History:  Diagnosis Date   COPD (chronic obstructive pulmonary disease) (HCC)    Hypertension    Stroke [facility])     Surgical History: Past Surgical History:  Procedure Laterality Date   BIOPSY OF SKIN SUBCUTANEOUS TISSUE AND/OR MUCOUS MEMBRANE  12/02/2024   Procedure: BIOPSY, GI;  Surgeon: Aundria, Ladell POUR, MD;  Location: [facility] ENDOSCOPY;  Service: Gastroenterology;;   COLONOSCOPY N/A 12/02/2024   Procedure: COLONOSCOPY;  Surgeon: Toledo, Ladell POUR, MD;  Location: [facility] ENDOSCOPY;  Service: Gastroenterology;  Laterality: N/A;   ESOPHAGOGASTRODUODENOSCOPY N/A 10/13/2024   Procedure: EGD (ESOPHAGOGASTRODUODENOSCOPY);  Surgeon: Jinny Carmine, MD;  Location: [facility] ENDOSCOPY;  Service: Endoscopy;  Laterality: N/A;   HEMOSTASIS CLIP PLACEMENT  12/02/2024   Procedure: CONTROL OF HEMORRHAGE, GI TRACT, ENDOSCOPIC, BY CLIPPING OR OVERSEWING;  Surgeon: Aundria, Ladell POUR, MD;  Location: [facility] ENDOSCOPY;  Service: Gastroenterology;;   POLYPECTOMY  12/02/2024   Procedure: POLYPECTOMY, INTESTINE;  Surgeon: Toledo, Teodoro K,  MD;  Location: [facility] ENDOSCOPY;  Service: Gastroenterology;;    Home Medications:  Allergies as of 12/20/2024   No Known Allergies      Medication List        Accurate as of December 20, 2024  4:26 PM. If you have any questions, ask your nurse or doctor.          albuterol  108 (90 Base) MCG/ACT inhaler Commonly known as: VENTOLIN  HFA INHALE 2 PUFFS BY MOUTH EVERY 6 HOURS AS NEEDED FOR WHEEZING FOR SHORTNESS OF BREATH   aspirin  EC 81 MG tablet Take 1 tablet (81 mg total) by mouth daily. Swallow whole.   atorvastatin  40 MG tablet Commonly known as: LIPITOR Take 1 tablet by mouth once daily   donepezil  5 MG tablet Commonly known as: ARICEPT  Take 1 tablet (5 mg total) by mouth at bedtime.   FeroSul 325 (65 Fe) MG tablet Generic drug: ferrous sulfate  Take 1 tablet (325 mg total) by mouth daily with breakfast.   lisinopril  20 MG tablet Commonly known as: ZESTRIL  Take 1 tablet by mouth once daily   magnesium  oxide 400 (240 Mg) MG tablet Commonly known as: MAG-OX Take 400 mg by mouth daily.   pantoprazole  40 MG tablet Commonly known as: PROTONIX  Take 1 tablet (40 mg total) by mouth daily.   prochlorperazine  5 MG tablet Commonly known as: COMPAZINE  Take 1 tablet (5 mg total) by mouth every 6 (six) hours as needed for nausea or vomiting.   senna-docusate 8.6-50 MG tablet Commonly known as: Senokot-S Take 1 tablet by mouth daily.   silodosin  4 MG Caps capsule Commonly known as: RAPAFLO  Take 1 capsule (4 mg total) by mouth  daily with breakfast. Started by: Glendia Barba, MD   vitamin D3 25 MCG tablet Commonly known as: CHOLECALCIFEROL Take 1,000 Units by mouth daily.        Allergies: Allergies[1]  Family History: Family History  Problem Relation Age of Onset   Diabetes Mother    Heart attack Father     Social History:  reports that he has quit smoking. His smoking use included cigarettes. He has a 16 pack-year smoking history. He has never used  smokeless tobacco. He reports that he does not currently use alcohol. He reports that he does not currently use drugs.   Physical Exam: BP 136/75   Pulse 73   Ht 5' 4 (1.626 m)   Wt 127 lb (57.6 kg)   BMI 21.80 kg/m   Constitutional:  Alert, No acute distress. HEENT: [place] AT Respiratory: Normal respiratory effort, no increased work of breathing. Psychiatric: Normal mood and affect.  Laboratory Data:  Urinalysis Dipstick: 1+ protein/trace blood/2+ leukocytes Microscopy: >30 WBC   Pertinent Imaging: CT images were personally reviewed and interpreted.  Prostate volume calculated at 45 cc (ellipsoid)/57 cc (bullet)   Assessment & Plan:    1.  BPH with LUTS Moderate-severe lower urinary tract symptoms PVR today was 0 mL Trial silodosin  4 mg daily Follow-up 1 month for symptom recheck/IPSS  2.  Pyuria Asymptomatic Urine culture ordered in the event he develops symptoms   Glendia JAYSON Barba, MD  [facility] [address], [address] [place], [place] [address] [phone]    [1] No Known Allergies  "

## 2024-12-20 NOTE — Telephone Encounter (Signed)
 Patient was notified of the most recent lab results.

## 2024-12-20 NOTE — Progress Notes (Signed)
 Patient presents for an office visit. BP today is High. Greater than 140/90. Provider  notified and recheck Blood Pressure .  Pt advised to talk with PCP.  Pt voiced understanding.   Blood pressure returned to Normal 136/75

## 2024-12-26 LAB — CULTURE, URINE COMPREHENSIVE

## 2024-12-28 ENCOUNTER — Ambulatory Visit: Payer: Self-pay | Admitting: Urology

## 2025-01-04 ENCOUNTER — Ambulatory Visit: Admitting: Student in an Organized Health Care Education/Training Program

## 2025-01-16 ENCOUNTER — Ambulatory Visit: Admitting: Nurse Practitioner

## 2025-01-18 ENCOUNTER — Ambulatory Visit: Admitting: Physician Assistant

## 2025-01-19 ENCOUNTER — Ambulatory Visit: Admitting: Physician Assistant

## 2025-02-28 ENCOUNTER — Inpatient Hospital Stay

## 2025-03-07 ENCOUNTER — Inpatient Hospital Stay

## 2025-03-07 ENCOUNTER — Inpatient Hospital Stay: Admitting: Oncology
